# Patient Record
Sex: Female | Born: 1955 | Race: White | Hispanic: Yes | Marital: Single | Smoking: Never smoker
Health system: Southern US, Community
[De-identification: ages and names within clinical notes are randomized; demographics above are authoritative.]

## PROBLEM LIST (undated history)

## (undated) DIAGNOSIS — E119 Type 2 diabetes mellitus without complications: Secondary | ICD-10-CM

---

## 2005-11-25 ENCOUNTER — Emergency Department (HOSPITAL_COMMUNITY): Admission: EM | Admit: 2005-11-25 | Discharge: 2005-11-25 | Payer: Self-pay | Admitting: Emergency Medicine

## 2005-11-27 ENCOUNTER — Emergency Department (HOSPITAL_COMMUNITY): Admission: EM | Admit: 2005-11-27 | Discharge: 2005-11-27 | Payer: Self-pay | Admitting: Emergency Medicine

## 2007-04-17 ENCOUNTER — Emergency Department (HOSPITAL_COMMUNITY): Admission: EM | Admit: 2007-04-17 | Discharge: 2007-04-17 | Payer: Self-pay | Admitting: Emergency Medicine

## 2007-12-11 ENCOUNTER — Emergency Department (HOSPITAL_COMMUNITY): Admission: EM | Admit: 2007-12-11 | Discharge: 2007-12-11 | Payer: Self-pay | Admitting: Emergency Medicine

## 2010-05-29 ENCOUNTER — Emergency Department (HOSPITAL_COMMUNITY): Admission: EM | Admit: 2010-05-29 | Discharge: 2010-05-29 | Payer: Self-pay | Admitting: Emergency Medicine

## 2010-10-05 ENCOUNTER — Emergency Department (HOSPITAL_COMMUNITY)
Admission: EM | Admit: 2010-10-05 | Discharge: 2010-10-05 | Disposition: A | Discharge status: Home / Self Care | Payer: Self-pay | Attending: Emergency Medicine | Admitting: Emergency Medicine

## 2010-10-05 DIAGNOSIS — R631 Polydipsia: Secondary | ICD-10-CM | POA: Insufficient documentation

## 2010-10-05 DIAGNOSIS — J329 Chronic sinusitis, unspecified: Secondary | ICD-10-CM | POA: Insufficient documentation

## 2010-10-05 DIAGNOSIS — R634 Abnormal weight loss: Secondary | ICD-10-CM | POA: Insufficient documentation

## 2010-10-05 DIAGNOSIS — L298 Other pruritus: Secondary | ICD-10-CM | POA: Insufficient documentation

## 2010-10-05 DIAGNOSIS — L2989 Other pruritus: Secondary | ICD-10-CM | POA: Insufficient documentation

## 2010-10-05 DIAGNOSIS — J3489 Other specified disorders of nose and nasal sinuses: Secondary | ICD-10-CM | POA: Insufficient documentation

## 2010-10-05 DIAGNOSIS — R21 Rash and other nonspecific skin eruption: Secondary | ICD-10-CM | POA: Insufficient documentation

## 2010-10-05 DIAGNOSIS — E119 Type 2 diabetes mellitus without complications: Secondary | ICD-10-CM | POA: Insufficient documentation

## 2010-10-05 DIAGNOSIS — R5381 Other malaise: Secondary | ICD-10-CM | POA: Insufficient documentation

## 2010-10-05 DIAGNOSIS — R51 Headache: Secondary | ICD-10-CM | POA: Insufficient documentation

## 2010-10-05 LAB — GLUCOSE, CAPILLARY

## 2010-10-05 LAB — POCT I-STAT, CHEM 8
HCT: 45 % (ref 36.0–46.0)
Hemoglobin: 15.3 g/dL — ABNORMAL HIGH (ref 12.0–15.0)
Potassium: 5.1 mEq/L (ref 3.5–5.1)
Sodium: 133 mEq/L — ABNORMAL LOW (ref 135–145)

## 2011-02-07 ENCOUNTER — Other Ambulatory Visit: Payer: Self-pay | Admitting: Family Medicine

## 2011-02-08 ENCOUNTER — Other Ambulatory Visit (HOSPITAL_COMMUNITY): Payer: Self-pay | Admitting: Family Medicine

## 2011-02-08 DIAGNOSIS — Z1231 Encounter for screening mammogram for malignant neoplasm of breast: Secondary | ICD-10-CM

## 2011-02-19 ENCOUNTER — Ambulatory Visit (HOSPITAL_COMMUNITY)
Admission: RE | Admit: 2011-02-19 | Discharge: 2011-02-19 | Disposition: A | Discharge status: Home / Self Care | Payer: Self-pay | Source: Ambulatory Visit | Attending: Family Medicine | Admitting: Family Medicine

## 2011-02-19 DIAGNOSIS — Z1231 Encounter for screening mammogram for malignant neoplasm of breast: Secondary | ICD-10-CM | POA: Insufficient documentation

## 2013-06-09 ENCOUNTER — Encounter (HOSPITAL_COMMUNITY): Payer: Self-pay | Admitting: Emergency Medicine

## 2013-06-09 ENCOUNTER — Emergency Department (HOSPITAL_COMMUNITY)
Admission: EM | Admit: 2013-06-09 | Discharge: 2013-06-10 | Disposition: A | Discharge status: Home / Self Care | Payer: Self-pay | Attending: Emergency Medicine | Admitting: Emergency Medicine

## 2013-06-09 DIAGNOSIS — R197 Diarrhea, unspecified: Secondary | ICD-10-CM | POA: Insufficient documentation

## 2013-06-09 DIAGNOSIS — E119 Type 2 diabetes mellitus without complications: Secondary | ICD-10-CM | POA: Insufficient documentation

## 2013-06-09 DIAGNOSIS — R51 Headache: Secondary | ICD-10-CM | POA: Insufficient documentation

## 2013-06-09 DIAGNOSIS — J069 Acute upper respiratory infection, unspecified: Secondary | ICD-10-CM | POA: Insufficient documentation

## 2013-06-09 DIAGNOSIS — R079 Chest pain, unspecified: Secondary | ICD-10-CM | POA: Insufficient documentation

## 2013-06-09 HISTORY — DX: Type 2 diabetes mellitus without complications: E11.9

## 2013-06-09 LAB — COMPREHENSIVE METABOLIC PANEL
ALT: 39 U/L — ABNORMAL HIGH (ref 0–35)
AST: 32 U/L (ref 0–37)
Albumin: 4 g/dL (ref 3.5–5.2)
BUN: 11 mg/dL (ref 6–23)
Calcium: 9 mg/dL (ref 8.4–10.5)
Creatinine, Ser: 0.51 mg/dL (ref 0.50–1.10)
GFR calc Af Amer: >90 mL/min (ref >90)
Glucose, Bld: 347 mg/dL — ABNORMAL HIGH (ref 70–99)
Sodium: 130 mEq/L — ABNORMAL LOW (ref 135–145)
Total Protein: 7.5 g/dL (ref 6.0–8.3)

## 2013-06-09 LAB — CBC WITH DIFFERENTIAL/PLATELET
Basophils Absolute: 0 10*3/uL (ref 0.0–0.1)
Eosinophils Absolute: 0.2 10*3/uL (ref 0.0–0.7)
HCT: 40.4 % (ref 36.0–46.0)
Hemoglobin: 14.4 g/dL (ref 12.0–15.0)
Lymphocytes Relative: 40 % (ref 12–46)
Lymphs Abs: 3.7 10*3/uL (ref 0.7–4.0)
Monocytes Absolute: 0.6 10*3/uL (ref 0.1–1.0)
Monocytes Relative: 7 % (ref 3–12)
Neutro Abs: 4.7 10*3/uL (ref 1.7–7.7)
Platelets: 309 10*3/uL (ref 150–400)
RBC: 4.63 MIL/uL (ref 3.87–5.11)
WBC: 9.3 10*3/uL (ref 4.0–10.5)

## 2013-06-09 LAB — GLUCOSE, CAPILLARY: Glucose-Capillary: 258 mg/dL — ABNORMAL HIGH (ref 70–99)

## 2013-06-09 LAB — POCT I-STAT TROPONIN I

## 2013-06-09 MED ORDER — KETOROLAC TROMETHAMINE 30 MG/ML IJ SOLN
30.0000 mg | Freq: Once | INTRAMUSCULAR | Status: AC
  Administered 2013-06-09: 30 mg via INTRAVENOUS
  Filled 2013-06-09: qty 1

## 2013-06-09 MED ORDER — SODIUM CHLORIDE 0.9 % IV BOLUS (SEPSIS)
1000.0000 mL | Freq: Once | INTRAVENOUS | Status: AC
  Administered 2013-06-09: 1000 mL via INTRAVENOUS

## 2013-06-09 MED ORDER — DIPHENHYDRAMINE HCL 50 MG/ML IJ SOLN
25.0000 mg | Freq: Once | INTRAMUSCULAR | Status: AC
  Administered 2013-06-09: 25 mg via INTRAVENOUS
  Filled 2013-06-09: qty 1

## 2013-06-09 MED ORDER — METOCLOPRAMIDE HCL 5 MG/ML IJ SOLN
10.0000 mg | Freq: Once | INTRAMUSCULAR | Status: AC
  Administered 2013-06-09: 10 mg via INTRAMUSCULAR
  Filled 2013-06-09: qty 2

## 2013-06-09 NOTE — ED Notes (Signed)
Took pts CBG. CBG was 258 reported to nurse Eileen Stanford.

## 2013-06-09 NOTE — ED Notes (Addendum)
Spanish speaking, interpreter phone used. Presents with chest pain, headache, blurred vision abdominal pain,  Diarrhea, bilateral ear pain and dizziness and shaking and nausea. Pain in chest is descriebd as squeezing. Began one week ago.  Reports the ear pain and sore throat are causing the headache.  +cough

## 2013-06-09 NOTE — ED Provider Notes (Signed)
CSN: [id number]     Arrival date & time 06/09/13  1703 History   First MD Initiated Contact with Patient 06/09/13 2013     Chief Complaint  Patient presents with  . multiple complaints    (Consider location/radiation/quality/duration/timing/severity/associated sxs/prior Treatment) HPI  Margaret Austin is a 57 year old female with a past medical history of diabetes who presents the emergency department with chief point of upper respiratory symptoms, headache and diarrhea.  Patient is Spanish-speaking interpreter from its use.  Patient states that she has had for years of sinus pressure.  Over the past 15 days she's had increasing sinus pain and pressure.  Over the past 4 days she has acquired productive cough and bilateral throbbing constant headache which she rates at 10 out of 10.Denies photophobia, phonophobia, UL throbbing, N/V, visual changes, stiff neck, neck pain, rash, or "thunderclap" onset.    She is a pill and Tylenol at home moderate relief of her symptoms. The patient states that today she began having watery diarrhea but denies any abdominal pain or nausea. Patient complains of chest pain with cough. Denies DOE, SOB, chest tightness or pressure, radiation to left arm, jaw or back, nausea, or diaphoresis.   Past Medical History  Diagnosis Date  . Diabetes mellitus without complication    History reviewed. No pertinent past surgical history. History reviewed. No pertinent family history. History  Substance Use Topics  . Smoking status: Never Smoker   . Smokeless tobacco: Not on file  . Alcohol Use: No   OB History   Grav Para Term Preterm Abortions TAB SAB Ect Mult Living                 Review of Systems Ten systems reviewed and are negative for acute change, except as noted in the HPI.   Allergies  Review of patient's allergies indicates no known allergies.  Home Medications  No current outpatient prescriptions on file. BP 109/74  Pulse 69  Temp(Src) 98.8 F  (37.1 C) (Oral)  Resp 11  SpO2 100% Physical Exam Appears moderately ill but not toxic; temperature as noted in vitals. Ears normal. Eyes:glassy appearance, no discharge  Heart: RRR, NO M/G/R Throat and pharynx normal.  Hoarse of voice Neck supple. No adenopathyhy in the neck.  Sinuses non tender.  The chest is clear. Abdomen is soft and nontender.   ED Course  Procedures (including critical care time) Labs Review Labs Reviewed  COMPREHENSIVE METABOLIC PANEL - Abnormal; Notable for the following:    Sodium 130 (*)    Chloride 94 (*)    Glucose, Bld 347 (*)    ALT 39 (*)    Alkaline Phosphatase 169 (*)    All other components within normal limits  CBC WITH DIFFERENTIAL  LIPASE, BLOOD  POCT I-STAT TROPONIN I   Imaging Review No results found.  EKG Interpretation   None     ECG interpretation   Date: 06/09/2013  Rate: 71  Rhythm: normal sinus rhythm  QRS Axis: normal  Intervals: normal  ST/T Wave abnormalities: normal  Conduction Disutrbances: none  Narrative Interpretation:   Old EKG Reviewed:      MDM   1. URI (upper respiratory infection)   2. Headache   3. Diarrhea    Patient with viral like symptoms.  Visual be treated for her headache with migraine cocktail.  We'll monitor her blood sugars is initial sugars are at 347.  She has a slight elevation in her alkaline phosphatase and ALT. Gap of 10.  11:44 PM BP 110/57  Pulse 64  Temp(Src) 98.8 F (37.1 C) (Oral)  Resp 17  SpO2 97% Patient feeling much better. Requesting to leave. Will d/c with zpak The patient appears reasonably screened and/or stabilized for discharge and I doubt any other medical condition or other [facility] requiring further screening, evaluation, or treatment in the ED at this time prior to discharge.   Patient w>15 days sinus sys. Will d.c with zpack. I suspect viral infection. Pt HA treated and improved while in ED. HA non concerning for [facility], ICH, Meningitis, or temporal  arteritis. Pt is afebrile with no focal neuro deficits, nuchal rigidity, or change in vision. Pt is to follow up with PCP to discuss prophylactic medication. Pt verbalizes understanding and is agreeable with plan to dc.    Arthor Captain, PA-C 06/10/13 0008

## 2013-06-09 NOTE — ED Notes (Addendum)
Pt Spanish speaking only- interpreter phones used for RN interview.  Pt presents for evaluation of epigastric pain for the past 3 days- admits to diarrhea for the past 2 days.  Pt also complaining of a headache, blurred vision, and dizziness for the past 8 days- headache associated with symptoms.  Pt admits to being diabetic.  Pt alert and oriented X 4.

## 2013-06-10 MED ORDER — AZITHROMYCIN 250 MG PO TABS: 250.0000 mg | ORAL_TABLET | Freq: Every day | ORAL | Status: DC

## 2013-06-10 MED ORDER — LOPERAMIDE HCL 2 MG PO CAPS
2.0000 mg | ORAL_CAPSULE | Freq: Four times a day (QID) | ORAL | Status: DC | PRN
Start: 2013-06-10 — End: 2014-04-17

## 2013-06-10 NOTE — ED Provider Notes (Signed)
Medical screening examination/treatment/procedure(s) were performed by non-physician practitioner and as supervising physician I was immediately available for consultation/collaboration.   Carmita Boom, MD 06/10/13 1515 

## 2013-07-18 ENCOUNTER — Emergency Department (HOSPITAL_COMMUNITY)
Admission: EM | Admit: 2013-07-18 | Discharge: 2013-07-19 | Disposition: A | Discharge status: Home / Self Care | Payer: Self-pay | Attending: Emergency Medicine | Admitting: Emergency Medicine

## 2013-07-18 ENCOUNTER — Encounter (HOSPITAL_COMMUNITY): Payer: Self-pay | Admitting: Emergency Medicine

## 2013-07-18 DIAGNOSIS — R739 Hyperglycemia, unspecified: Secondary | ICD-10-CM

## 2013-07-18 DIAGNOSIS — H9209 Otalgia, unspecified ear: Secondary | ICD-10-CM | POA: Insufficient documentation

## 2013-07-18 DIAGNOSIS — Z79899 Other long term (current) drug therapy: Secondary | ICD-10-CM | POA: Insufficient documentation

## 2013-07-18 DIAGNOSIS — E119 Type 2 diabetes mellitus without complications: Secondary | ICD-10-CM | POA: Insufficient documentation

## 2013-07-18 LAB — URINALYSIS, ROUTINE W REFLEX MICROSCOPIC
Bilirubin Urine: NEGATIVE
Glucose, UA: >1000 mg/dL — AB
Hgb urine dipstick: NEGATIVE
Ketones, ur: NEGATIVE mg/dL
Leukocytes, UA: NEGATIVE
Nitrite: NEGATIVE
Specific Gravity, Urine: 1.036 — ABNORMAL HIGH (ref 1.005–1.030)

## 2013-07-18 LAB — URINE MICROSCOPIC-ADD ON

## 2013-07-18 MED ORDER — SODIUM CHLORIDE 0.9 % IV BOLUS (SEPSIS)
1000.0000 mL | INTRAVENOUS | Status: AC
  Administered 2013-07-18: 1000 mL via INTRAVENOUS

## 2013-07-18 MED ORDER — MORPHINE SULFATE 4 MG/ML IJ SOLN
4.0000 mg | INTRAMUSCULAR | Status: AC
  Administered 2013-07-18: 4 mg via INTRAVENOUS
  Filled 2013-07-18: qty 1

## 2013-07-18 NOTE — ED Notes (Signed)
Dr. Romeo Apple at the bedside with interpreter phone.

## 2013-07-18 NOTE — ED Notes (Signed)
Lab called Nurse First. Need to recollect urine spilled in bag.

## 2013-07-18 NOTE — ED Notes (Signed)
CBG check of 471 completed per MD.

## 2013-07-18 NOTE — ED Provider Notes (Signed)
CSN: [id number]     Arrival date & time 07/18/13  2003 History   First MD Initiated Contact with Patient 07/18/13 2313     Chief Complaint  Patient presents with  . Flank Pain  . Otalgia   (Consider location/radiation/quality/duration/timing/severity/associated sxs/prior Treatment) Patient is a 57 y.o. female presenting with flank pain and ear pain.  Flank Pain This is a new problem. Episode onset: 8 days ago. The problem occurs constantly. The problem has not changed since onset.Pertinent negatives include no chest pain, no abdominal pain, no headaches and no shortness of breath. Nothing aggravates the symptoms. Nothing relieves the symptoms. She has tried nothing for the symptoms. The treatment provided no relief.  Otalgia Location:  Left Behind ear:  No abnormality Quality:  Aching Severity:  Mild Onset quality:  Gradual Duration:  1 month Timing:  Intermittent Progression:  Unchanged Chronicity:  New Relieved by:  Nothing Worsened by:  Nothing tried Ineffective treatments: otc ear gtt's. Associated symptoms: no abdominal pain, no congestion, no cough, no diarrhea, no ear discharge, no fever, no headaches, no neck pain and no vomiting     Past Medical History  Diagnosis Date  . Diabetes mellitus without complication    History reviewed. No pertinent past surgical history. No family history on file. History  Substance Use Topics  . Smoking status: Never Smoker   . Smokeless tobacco: Not on file  . Alcohol Use: No   OB History   Grav Para Term Preterm Abortions TAB SAB Ect Mult Living                 Review of Systems  Constitutional: Negative for fever and fatigue.  HENT: Positive for ear pain. Negative for congestion, drooling and ear discharge.   Eyes: Negative for pain.  Respiratory: Negative for cough and shortness of breath.   Cardiovascular: Negative for chest pain.  Gastrointestinal: Negative for nausea, vomiting, abdominal pain and diarrhea.   Genitourinary: Positive for flank pain. Negative for dysuria and hematuria.  Musculoskeletal: Negative for back pain, gait problem and neck pain.  Skin: Negative for color change.  Neurological: Negative for dizziness and headaches.  Hematological: Negative for adenopathy.  Psychiatric/Behavioral: Negative for behavioral problems.  All other systems reviewed and are negative.    Allergies  Review of patient's allergies indicates no known allergies.  Home Medications   Current Outpatient Rx  Name  Route  Sig  Dispense  Refill  . metFORMIN (GLUCOPHAGE) 500 MG tablet   Oral   Take 500 mg by mouth 2 (two) times daily with a meal. Pt stats that she is out of metformin has not had it for 3 months          BP 132/74  Pulse 84  Temp(Src) 97.8 F (36.6 C) (Oral)  Resp 20  Ht 5\' 1"  (1.549 m)  Wt 166 lb (75.297 kg)  BMI 31.38 kg/m2  SpO2 97% Physical Exam  Nursing note and vitals reviewed. Constitutional: She is oriented to person, place, and time. She appears well-developed and well-nourished.  HENT:  Head: Normocephalic.  Mouth/Throat: Oropharynx is clear and moist. No oropharyngeal exudate.  Tympanic membranes appear normal bilaterally.  Eyes: Conjunctivae and EOM are normal. Pupils are equal, round, and reactive to light.  Neck: Normal range of motion. Neck supple.  Cardiovascular: Normal rate, regular rhythm, normal heart sounds and intact distal pulses.  Exam reveals no gallop and no friction rub.   No murmur heard. Pulmonary/Chest: Effort normal and breath sounds normal.  No respiratory distress. She has no wheezes.  Abdominal: Soft. Bowel sounds are normal. There is no tenderness. There is no rebound and no guarding.  Musculoskeletal: Normal range of motion. She exhibits no edema and no tenderness.  Mild left CVA tenderness to palpation.  Neurological: She is alert and oriented to person, place, and time.  Skin: Skin is warm and dry.  Psychiatric: She has a normal mood  and affect. Her behavior is normal.    ED Course  Procedures (including critical care time) Labs Review Labs Reviewed  URINALYSIS, ROUTINE W REFLEX MICROSCOPIC - Abnormal; Notable for the following:    Specific Gravity, Urine 1.036 (*)    Glucose, UA >1000 (*)    All other components within normal limits  URINE MICROSCOPIC-ADD ON - Abnormal; Notable for the following:    Squamous Epithelial / LPF FEW (*)    All other components within normal limits  GLUCOSE, CAPILLARY - Abnormal; Notable for the following:    Glucose-Capillary 471 (*)    All other components within normal limits  CBC WITH DIFFERENTIAL - Abnormal; Notable for the following:    MCHC 36.1 (*)    Lymphocytes Relative 47 (*)    Lymphs Abs 4.2 (*)    All other components within normal limits  COMPREHENSIVE METABOLIC PANEL - Abnormal; Notable for the following:    Sodium 128 (*)    Chloride 92 (*)    Glucose, Bld 488 (*)    Creatinine, Ser 0.41 (*)    Alkaline Phosphatase 160 (*)    All other components within normal limits  URINALYSIS W MICROSCOPIC + REFLEX CULTURE - Abnormal; Notable for the following:    Specific Gravity, Urine 1.040 (*)    Glucose, UA >1000 (*)    All other components within normal limits  GLUCOSE, CAPILLARY - Abnormal; Notable for the following:    Glucose-Capillary 451 (*)    All other components within normal limits  GLUCOSE, CAPILLARY - Abnormal; Notable for the following:    Glucose-Capillary 298 (*)    All other components within normal limits  GLUCOSE, CAPILLARY - Abnormal; Notable for the following:    Glucose-Capillary 293 (*)    All other components within normal limits  GLUCOSE, CAPILLARY - Abnormal; Notable for the following:    Glucose-Capillary 247 (*)    All other components within normal limits  LIPASE, BLOOD   Imaging Review Ct Abdomen Pelvis Wo Contrast  07/19/2013   CLINICAL DATA:  58 year old female with bilateral flank pain. History of urinary calculi.  EXAM: CT  ABDOMEN AND PELVIS WITHOUT CONTRAST  TECHNIQUE: Multidetector CT imaging of the abdomen and pelvis was performed following the standard protocol without intravenous contrast.  COMPARISON:  None.  FINDINGS: A 1 cm hypodense lesion within the left liver (image 24) is nonspecific but statistically benign. No other hepatic abnormalities are identified.  The spleen, pancreas, adrenal glands and kidneys are unremarkable. There is no evidence of urinary calculi or hydronephrosis.  Cholelithiasis identified without CT evidence of acute cholecystitis.  Please note that parenchymal abnormalities may be missed without intravenous contrast.  There is no evidence of free fluid, enlarged lymph nodes, biliary dilation or abdominal aortic aneurysm.  The bowel, bladder and appendix are unremarkable. There is no evidence of bowel obstruction, abscess or pneumoperitoneum.  The uterus and adnexal regions are within normal limits except for evidence of tubal ligation.  No acute or suspicious bony abnormalities are identified.  IMPRESSION: No evidence of acute abnormality.  No evidence  of urinary calculi.  Cholelithiasis without CT evidence of acute cholecystitis.  Nonspecific 1 cm left hepatic lesion - statistically benign unless the patient has a history of primary neoplasm. Comparison with any outside studies recommended.   Electronically Signed   By: Laveda Abbe M.D.   On: 07/19/2013 00:38    EKG Interpretation   None       MDM   1. Hyperglycemia    11:34 PM 57 y.o. female who presents with left flank pain for the last 8 days. The patient denies any fever, vomiting, diarrhea, or dysuria. She does note a history of kidney stones. She states that she was diagnosed with diabetes one year ago and has been on metformin. She states that she felt like she was getting better and quit taking the metformin approximately 2 months ago. She notes that recently her blood sugars have read "high" on her home glucometer. The patient is  afebrile and vital signs are unremarkable here. Will get pain control with morphine, CT scan to evaluate for kidney stone, and IV fluid for hyperglycemia. Will get screening labs as well.  7:33 AM: No anion gap or evidence of acidosis. BS came down w/ small amount of insulin and IVF. CT showed cholelithiasis and hepatic lesion. I discussed the lesion w/ her using an interpreter. She denies a hx of cancer. Will have her notify her pcp of the lesion so they can follow it. Will have pt resume taking metformin and [facility] to re-establish with a physician.  She only got 1 dose of morphine in the ED and her pain has been gone since then. I am unsure of the etiology of the left flank pain or her otalgia.  I have discussed the diagnosis/risks/treatment options with the patient and believe the pt to be eligible for discharge home to follow-up with and establish with a pcp. We also discussed returning to the ED immediately if new or worsening sx occur. We discussed the sx which are most concerning (e.g., elevated BS's, fever, return of abd pain) that necessitate immediate return. Any new prescriptions provided to the patient are listed below.  Discharge Medication List as of 07/19/2013  7:37 AM    START taking these medications   Details  !! metFORMIN (GLUCOPHAGE) 500 MG tablet Take 1 tablet (500 mg total) by mouth 2 (two) times daily with a meal., Starting 07/19/2013, Until Discontinued, Print     !! - Potential duplicate medications found. Please discuss with provider.       Junius Argyle, MD 07/20/13 [phone]

## 2013-07-18 NOTE — ED Notes (Signed)
Pt. reports left flank pain and bilateral ear aches for several days , denies injury , no dysuria or hematuria .

## 2013-07-19 ENCOUNTER — Emergency Department (HOSPITAL_COMMUNITY): Payer: Self-pay

## 2013-07-19 LAB — URINALYSIS W MICROSCOPIC + REFLEX CULTURE
Leukocytes, UA: NEGATIVE
Nitrite: NEGATIVE
Specific Gravity, Urine: 1.04 — ABNORMAL HIGH (ref 1.005–1.030)
pH: 6.5 (ref 5.0–8.0)

## 2013-07-19 LAB — CBC WITH DIFFERENTIAL/PLATELET
Basophils Relative: 0 % (ref 0–1)
Eosinophils Absolute: 0.2 10*3/uL (ref 0.0–0.7)
HCT: 39.3 % (ref 36.0–46.0)
Hemoglobin: 14.2 g/dL (ref 12.0–15.0)
MCH: 30.7 pg (ref 26.0–34.0)
MCHC: 36.1 g/dL — ABNORMAL HIGH (ref 30.0–36.0)
Monocytes Absolute: 0.5 10*3/uL (ref 0.1–1.0)
Monocytes Relative: 6 % (ref 3–12)
Neutrophils Relative %: 44 % (ref 43–77)
RDW: 11.9 % (ref 11.5–15.5)

## 2013-07-19 LAB — COMPREHENSIVE METABOLIC PANEL
Albumin: 3.9 g/dL (ref 3.5–5.2)
BUN: 10 mg/dL (ref 6–23)
Creatinine, Ser: 0.41 mg/dL — ABNORMAL LOW (ref 0.50–1.10)
Total Protein: 6.9 g/dL (ref 6.0–8.3)

## 2013-07-19 LAB — GLUCOSE, CAPILLARY: Glucose-Capillary: 247 mg/dL — ABNORMAL HIGH (ref 70–99)

## 2013-07-19 LAB — LIPASE, BLOOD: Lipase: 27 U/L (ref 11–59)

## 2013-07-19 MED ORDER — SODIUM CHLORIDE 0.9 % IV BOLUS (SEPSIS)
1000.0000 mL | INTRAVENOUS | Status: AC
  Administered 2013-07-19: 1000 mL via INTRAVENOUS

## 2013-07-19 MED ORDER — INSULIN ASPART 100 UNIT/ML ~~LOC~~ SOLN
5.0000 [IU] | Freq: Once | SUBCUTANEOUS | Status: AC
  Administered 2013-07-19: 5 [IU] via INTRAVENOUS
  Filled 2013-07-19: qty 1

## 2013-07-19 MED ORDER — METFORMIN HCL 500 MG PO TABS: 500.0000 mg | ORAL_TABLET | Freq: Two times a day (BID) | ORAL | Status: DC

## 2013-07-19 MED ORDER — DOCUSATE SODIUM 100 MG PO CAPS: 100.0000 mg | ORAL_CAPSULE | Freq: Every day | ORAL | Status: DC

## 2013-07-19 MED ORDER — INSULIN ASPART 100 UNIT/ML ~~LOC~~ SOLN
3.0000 [IU] | Freq: Once | SUBCUTANEOUS | Status: AC
  Administered 2013-07-19: 3 [IU] via INTRAVENOUS
  Filled 2013-07-19: qty 1

## 2013-07-19 NOTE — ED Notes (Signed)
CBG check of 298 completed

## 2013-07-19 NOTE — ED Notes (Signed)
CBG check of 451

## 2013-07-19 NOTE — ED Notes (Signed)
CBG check of 293.

## 2013-07-30 ENCOUNTER — Ambulatory Visit: Payer: Self-pay | Attending: Internal Medicine

## 2013-07-30 VITALS — BP 99/62 | HR 83 | Temp 98.1°F | Resp 16 | Ht 60.0 in | Wt 163.0 lb

## 2013-07-30 DIAGNOSIS — B373 Candidiasis of vulva and vagina: Secondary | ICD-10-CM

## 2013-07-30 DIAGNOSIS — E1165 Type 2 diabetes mellitus with hyperglycemia: Secondary | ICD-10-CM

## 2013-07-30 DIAGNOSIS — E119 Type 2 diabetes mellitus without complications: Secondary | ICD-10-CM

## 2013-07-30 DIAGNOSIS — H60391 Other infective otitis externa, right ear: Secondary | ICD-10-CM

## 2013-07-30 LAB — POCT URINALYSIS DIPSTICK
Blood, UA: NEGATIVE
Glucose, UA: 500
Nitrite, UA: NEGATIVE
Urobilinogen, UA: 0.2

## 2013-07-30 LAB — GLUCOSE, POCT (MANUAL RESULT ENTRY): POC Glucose: 369 mg/dl — AB (ref 70–99)

## 2013-07-30 MED ORDER — NEOMYCIN-POLYMYXIN-HC 3.5-10000-1 OT SOLN: 4.0000 [drp] | Freq: Four times a day (QID) | OTIC | Status: DC

## 2013-07-30 MED ORDER — METFORMIN HCL 1000 MG PO TABS: 1000.0000 mg | ORAL_TABLET | Freq: Two times a day (BID) | ORAL | Status: DC

## 2013-07-30 MED ORDER — GLIPIZIDE 5 MG PO TABS: 5.0000 mg | ORAL_TABLET | Freq: Two times a day (BID) | ORAL | Status: DC

## 2013-07-30 MED ORDER — FLUCONAZOLE 150 MG PO TABS: 150.0000 mg | ORAL_TABLET | Freq: Once | ORAL | Status: DC

## 2013-07-30 NOTE — Progress Notes (Unsigned)
Pt here to establish care for Hx newly diagnosed diabetes x 2 weeks ago. Pt unsure of Metformin dosage; takes BID C/o bright yellow urine with strong odor,itch and burn Lower pelvic pain with radiating back pain [place] interpretor used for spanish language

## 2013-07-31 NOTE — Progress Notes (Unsigned)
   Subjective:    Patient ID: Margaret Austin, female    DOB: [birth year], 57 y.o.   MRN: [id number]  HPI Pt here for f/u on dm which she feels is controlled but a1c of 14 suggests otherwise. She gets repeated vaginal yeast infections. She has a meter but doesn't check sugars. She has polyurea and polydipsia. Sometimes takes her meds. No blurry vision or confusion. Right ear painful.  Review of Systems A 12 point review of systems is negative except as per hpi.       Objective:   Physical Exam Nursing note and vitals reviewed. Constitutional: She is oriented to person, place, and time. She appears well-developed and well-nourished.  HENT:  Right Ear: External ear normal.  Left Ear: External ear normal.  Nose: Nose normal.  Mouth/Throat: Oropharynx is clear and moist. No oropharyngeal exudate.  Eyes: Conjunctivae are normal. Pupils are equal, round, and reactive to light.  Neck: Normal range of motion. Neck supple. No thyromegaly present.  Cardiovascular: Normal rate, regular rhythm and normal heart sounds.   Pulmonary/Chest: Effort normal and breath sounds normal.  Abdominal: Soft. Bowel sounds are normal. She exhibits no distension. There is no tenderness. There is no rebound.  Lymphadenopathy:    She has no cervical adenopathy.  Neurological: She is alert and oriented to person, place, and time. She has normal reflexes.  Skin: Skin is warm and dry.  Psychiatric: She has a normal mood and affect. Her behavior is normal.         Assessment & Plan:  Rutherford Nail was seen today for establish care, diabetes, headache, urinary tract infection and back pain.  Diagnoses and associated orders for this visit:  Diabetes type 2, uncontrolled - glipiZIDE (GLUCOTROL) 5 MG tablet; Take 1 tablet (5 mg total) by mouth 2 (two) times daily before a meal. - metFORMIN (GLUCOPHAGE) 1000 MG tablet; Take 1 tablet (1,000 mg total) by mouth 2 (two) times daily with a meal.  Diabetes - Urinalysis  Dipstick - HgB A1c - Glucose (CBG)  Vaginal candida - fluconazole (DIFLUCAN) 150 MG tablet; Take 1 tablet (150 mg total) by mouth once.  Otitis, externa, infective, right - neomycin-polymyxin-hydrocortisone (CORTISPORIN) otic solution; Place 4 drops into the right ear 4 (four) times daily.  Extensive counselling about DM given. She declines dm ed at this time.

## 2013-08-31 ENCOUNTER — Ambulatory Visit: Payer: Self-pay

## 2013-09-15 ENCOUNTER — Ambulatory Visit: Payer: No Typology Code available for payment source | Attending: Internal Medicine | Admitting: Internal Medicine

## 2013-09-15 ENCOUNTER — Encounter: Payer: Self-pay | Admitting: Internal Medicine

## 2013-09-15 VITALS — BP 110/73 | HR 67 | Temp 98.7°F | Resp 16 | Ht 60.0 in | Wt 166.0 lb

## 2013-09-15 DIAGNOSIS — J329 Chronic sinusitis, unspecified: Secondary | ICD-10-CM | POA: Insufficient documentation

## 2013-09-15 DIAGNOSIS — K219 Gastro-esophageal reflux disease without esophagitis: Secondary | ICD-10-CM

## 2013-09-15 DIAGNOSIS — D229 Melanocytic nevi, unspecified: Secondary | ICD-10-CM

## 2013-09-15 DIAGNOSIS — D239 Other benign neoplasm of skin, unspecified: Secondary | ICD-10-CM

## 2013-09-15 DIAGNOSIS — R0981 Nasal congestion: Secondary | ICD-10-CM

## 2013-09-15 DIAGNOSIS — Z139 Encounter for screening, unspecified: Secondary | ICD-10-CM

## 2013-09-15 DIAGNOSIS — R21 Rash and other nonspecific skin eruption: Secondary | ICD-10-CM | POA: Insufficient documentation

## 2013-09-15 DIAGNOSIS — J3489 Other specified disorders of nose and nasal sinuses: Secondary | ICD-10-CM

## 2013-09-15 DIAGNOSIS — E119 Type 2 diabetes mellitus without complications: Secondary | ICD-10-CM | POA: Insufficient documentation

## 2013-09-15 LAB — GLUCOSE, POCT (MANUAL RESULT ENTRY): POC GLUCOSE: 94 mg/dL (ref 70–99)

## 2013-09-15 LAB — POCT GLYCOSYLATED HEMOGLOBIN (HGB A1C): HEMOGLOBIN A1C: 9.4

## 2013-09-15 MED ORDER — OMEPRAZOLE 20 MG PO CPDR: 20.0000 mg | DELAYED_RELEASE_CAPSULE | Freq: Every day | ORAL | Status: DC

## 2013-09-15 MED ORDER — HYDROCORTISONE 1 % EX CREA: 1.0000 "application " | TOPICAL_CREAM | Freq: Two times a day (BID) | CUTANEOUS | Status: DC

## 2013-09-15 MED ORDER — SALINE SPRAY 0.65 % NA SOLN: 1.0000 | NASAL | Status: DC | PRN

## 2013-09-15 MED ORDER — METFORMIN HCL 1000 MG PO TABS: 1000.0000 mg | ORAL_TABLET | Freq: Two times a day (BID) | ORAL | Status: DC

## 2013-09-15 MED ORDER — SULFAMETHOXAZOLE-TMP DS 800-160 MG PO TABS: 1.0000 | ORAL_TABLET | Freq: Two times a day (BID) | ORAL | Status: DC

## 2013-09-15 NOTE — Progress Notes (Signed)
Pt is here to establish care. Pt has a sinus infection and her nose started bleeding today. Pt needed the language line today. Pt states that her neck has been very itchy lately.

## 2013-09-15 NOTE — Progress Notes (Signed)
Patient Demographics  Margaret Austin, is a 58 y.o. female  IHK:[id number]  VFI:[id number]  DOB - [birth year]  CC:  Chief Complaint  Patient presents with  . Establish Care       HPI: Margaret Austin is a 58 y.o. female here today to establish medical care. She has history of diabetes and is taking metformin, she is requesting refill on the medication, she has been having lot of stuff nose runny nose some nasal bleeding today headache productive cough her, denies any chest pain or shortness of breath, denies smoking cigarettes, she also reported to have moles on the back ? increasing in the size, as per patient she saw a dermatologist several years ago, she is also having lot of reflux heartburn, denies any change in bowel habits. Patient also reported to have itchy rash on her neck, denies any change in cosmetics or applying new cream recently.  Patient has No headache, No chest pain, No abdominal pain - No Nausea, No new weakness tingling or numbness, No Cough - SOB.  No Known Allergies Past Medical History  Diagnosis Date  . Diabetes mellitus without complication    Current Outpatient Prescriptions on File Prior to Visit  Medication Sig Dispense Refill  . docusate sodium (COLACE) 100 MG capsule Take 1 capsule (100 mg total) by mouth daily.  20 capsule  0   No current facility-administered medications on file prior to visit.   History reviewed. No pertinent family history. History   Social History  . Marital Status: Single    Spouse Name: N/A    Number of Children: N/A  . Years of Education: N/A   Occupational History  . Not on file.   Social History Main Topics  . Smoking status: Never Smoker   . Smokeless tobacco: Not on file  . Alcohol Use: No  . Drug Use: No  . Sexual Activity: Not on file   Other Topics Concern  . Not on file   Social History Narrative  . No narrative on file    Review of Systems: Constitutional: Negative for fever, chills, diaphoresis, activity  change, appetite change and fatigue. HENT: Negative for ear pain, nosebleeds, congestion, facial swelling, rhinorrhea, neck pain, neck stiffness and ear discharge.  Eyes: Negative for pain, discharge, redness, itching and visual disturbance. Respiratory: Negative for cough, choking, chest tightness, shortness of breath, wheezing and stridor.  Cardiovascular: Negative for chest pain, palpitations and leg swelling. Gastrointestinal: Negative for abdominal distention.+ Heartburn  Genitourinary: Negative for dysuria, urgency, frequency, hematuria, flank pain, decreased urine volume, difficulty urinating and dyspareunia.  Musculoskeletal: Negative for back pain, joint swelling, arthralgia and gait problem. Neurological: Negative for dizziness, tremors, seizures, syncope, facial asymmetry, speech difficulty, weakness, light-headedness, numbness and headaches.  Psychiatric/Behavioral: Negative for hallucinations, behavioral problems, confusion, dysphoric mood, decreased concentration and agitation.    Objective:   Filed Vitals:   09/15/13 1446  BP: 110/73  Pulse: 67  Temp: 98.7 F (37.1 C)  Resp: 16    Physical Exam: Constitutional: Patient appears well-developed and well-nourished. No distress. HENT:  nasal congestion and sinus tenderness+  Eyes: Conjunctivae and EOM are normal. PERRLA, no scleral icterus. Neck: Normal ROM. Neck supple. No JVD. No tracheal deviation. No thyromegaly. CVS: RRR, S1/S2 +, no murmurs, no gallops, no carotid bruit.  Pulmonary: Effort and breath sounds normal, no stridor, rhonchi, wheezes, rales.  Abdominal: Soft. BS +, no distension, tenderness, rebound or guarding.  Musculoskeletal: Normal range of motion. No edema and no tenderness.  Neuro: Alert.  Normal reflexes, muscle tone coordination. No cranial nerve deficit. Skin:  erythematous rash on the neck ... moles on the back Psychiatric: Normal mood and affect. Behavior, judgment, thought content normal.  Lab  Results  Component Value Date   WBC 9.0 07/18/2013   HGB 14.2 07/18/2013   HCT 39.3 07/18/2013   MCV 85.1 07/18/2013   PLT 244 07/18/2013   Lab Results  Component Value Date   CREATININE 0.41* 07/18/2013   BUN 10 07/18/2013   NA 128* 07/18/2013   K 3.9 07/18/2013   CL 92* 07/18/2013   CO2 27 07/18/2013    Lab Results  Component Value Date   HGBA1C 9.4 09/15/2013   Lipid Panel  No results found for this basename: chol, trig, hdl, cholhdl, vldl, ldlcalc       Assessment and plan:   1. Diabetes  - Glucose (CBG) - HgB A1c - metFORMIN (GLUCOPHAGE) 1000 MG tablet; Take 1 tablet (1,000 mg total) by mouth 2 (two) times daily with a meal.  Dispense: 60 tablet; Refill: 3  also advise patient for diabetes meal planning and was given the handout.   Sinusitis/ nasal congestion    prescribed her Bactrim for tenderness  and nasal spray For nasal congestion   2. Numerous moles  - Ambulatory referral to Dermatology  3. Rash  - hydrocortisone cream 1 %; Apply 1 application topically 2 (two) times daily.  Dispense: 30 g; Refill: 1  4. GERD (gastroesophageal reflux disease)  - omeprazole (PRILOSEC) 20 MG capsule; Take 1 capsule (20 mg total) by mouth daily.  Dispense: 30 capsule; Refill: 3  5. Screening  - CBC with Differential - COMPLETE METABOLIC PANEL WITH GFR - TSH - Lipid panel - Vit D  25 hydroxy (rtn osteoporosis monitoring)   Return in about 6 weeks (around 10/27/2013).     Lorayne Marek, MD

## 2013-09-15 NOTE — Patient Instructions (Signed)
Gua de planeamiento de la alimentacin para diabticos (Diabetes Meal Planning Guide) La gua de planeamiento de alimentacin para diabticos es una herramienta para ayudarlo a planear sus comidas y colaciones. Es importante para las personas con diabetes controlar sus niveles de Location manager. Elegir los [employer] correctos y las cantidades adecuadas durante el da le ayudar a Media planner. [place] bien puede incluso ayudarlo a mejorar la presin sangunea y Science writer o Theatre manager un peso saludable. CUENTE LOS HIDRATOS DE CARBONO CON FACILIDAD Cuando consume hidratos de carbono, stos se transforman en azcar (glucosa). Esto a su vez Agricultural consultant de Museum/gallery exhibitions officer. El conteo de carbohidratos puede ayudarlo a Chief Technology Officer este nivel para que se sienta mejor. Al planear sus alimentos con el conteo de carbohidratos, podr tener ms flexibilidad en lo que come y Curator con el consumo de alimentos. El conteo de carbohidratos significa simplemente sumar la cantidad total de gramos de carbohidratos a sus comidas o colaciones. Trate de consumir la misma cantidad en cada comida. A continuacin encontrar una lista de 1 porcin o 15 gr. de carbohidratos. A continuacin se enumeran. Pregunte al mdico cuntos gramos de carbohidratos necesita comer en cada comida o colacin. Almidones y granos  1 [place] de pan.   bollo ingls o bollo para hamburguesa o hotdog.   taza de cereal fro (sin azcar).   taza de pasta o arroz cocido.   taza de vegetales que contengan almidn (maz, papas, arvejas, porotos, calabaza).  1 omelette (6 pulgadas).   bollo.  1 waffle o panqueque (del tamao de un CD).   taza de cereales cocidos.  4 a 6 galletas saldas pequeas. *Se recomienda el consumo de granos enteros. Frutas  1 taza de frutos rojos, meln, papaya o anan sin azcar.  1 fruta fresca pequea.   banana o mango.   taza de jugo de frutas (4 onzas sin endulzar).    taza de fruta envasada en jugo natural o agua.  2 cucharadas de frutas secas.  12-15 uvas o cerezas. Leche y yogurt  1 taza de [employer] o al 1%.  [place].  6 onzas de yogurt descremado con edulcorante sin azcar.  6 onzas de yogur descremado de soja.  6 onzas de yogur natural. Vegetales  1 taza de vegetales crudos o  de vegetales cocidos se considera cero carbohidratos o una comida "libre".  Si come 3 o ms porciones en una comida, cuntelas como 1 porcin de carbohidratos. Otros carbohidratos   onzas de chips o pretzels.   taza de helado de crema o yogur helado.   taza de helado de agua.  5 cm de torta no congelada.  1 cucharada de miel, azcar, mermelada, jalea o almbar.  2 galletitas dulces pequeas.  3 cuadrados de crackers de graham.  3 tazas de palomitas de maz.  6 crackers.  1 taza de caldo.  Cuente 1 taza de guisado u otra mezcla de alimentos como 2 porciones de carbohidratos.  Los alimentos con menos de 20 caloras por porcin deben contarse como cero carbohidratos o alimento "libre". Si lo desea compre un libro o software de computacin que enumere la cantidad de gramos de carbohidratos de los diferentes alimentos. Adems, el panel nutricional en las etiquetas de los productos que consume es una buena fuente de informacin. Le indicar el tamao de la porcin y la cantidad total de carbohidratos que consumir por cada una. Divida este nmero por 15 para obtener el nmero  de conteo de carbohidratos por porcin. Recuerde: cada porcin son 15 gramos de carbohidratos. PORCIONES La medicin de los alimentos y el tamao de las porciones lo ayudarn a controlar la cantidad exacta de comida que debe ingerir. La lista que sigue le mostrar el tamao de algunas porciones comunes.   1 onza.................4 dados apilados.  3 onzas...............Un mazo de cartas.  1 cucharadita.....La punta de un dedo pequeo.  1  cucharada........Un dedo.  2 cucharadas......Una pelota de golf.   taza...............La mitad de un puo.  1 taza................Un puo. EJEMPLO DE PLAN DE ALIMENTACIN PARA DIABTICOS: A continuacin se muestra un ejemplo de plan de alimentacin que incluye comidas de los grupos de granos y fculas, vegetales, frutas y carnes. Un nutricionista podr confeccionarle un plan individualizado para cubrir sus necesidades calricas y decirle el nmero de porciones que necesita de cada grupo. Sin embargo, podra intercambiar los alimentos que contengan carbohidratos (lcteos, cereales y frutas). Controlar la cantidad total de carbohidratos en los alimentos o colaciones es ms importante que asegurarse de incluir todos los grupos alimenticios cada vez que come.  El siguiente plan de alimentacin es un ejemplo de una dieta de 2000 caloras mediante el conteo de carbohidratos. Este plan contiene 17 porciones de carbohidratos. Desayuno  1 taza de avena (2 porciones de carbohidratos).   taza de yogur light(1 porcin de carbohidratos).  1 taza de arndanos (1 porcin de carbohidratos).   taza de almendras. Colacin  1 manzana grande (2 porciones de carbohidratos).  1 palito de queso bajo en grasa. Almuerzo  Ensalada de pechuga de pollo.  1 taza de espinacas.   taza de tomates cortados.  2 oz (60 gr) de pechuga de pollo en rebanadas.  2 cucharadas de aderezo italiano bajo en contenido graso.  12 galletas integrales (2 porciones de carbohidratos).  12 a 15 uvas (1 porcin de carbohidratos).  1 taza de leche descremada (1porcin de carbohidratos). Colacin  1 taza de zanahorias.   taza de pur de garbanzos (1 porcin de carbohidratos). Cena  3 oz (80 gr) de salmn a la parrilla.  1 taza de arroz integral (3 porciones de carbohidratos). Colacin  1  taza de brcoli al vapor (1 porcin de carbohidrato) con una cucharadita de aceite de oliva y jugo de limn.  1 taza de  budn light (2 porciones de carbohidratos). HOJA DE PLANEAMIENTO DE LA ALIMENTACIN: El dietista podr utilizar esta hoja para ayudarlo a decidir cuntas porciones y qu tipos de alimentos son los adecuados para usted.  DESAYUNO Grupo de alimentos y porciones / Alimento elegido Granos/Fculas_________________________________________________ Lcteos________________________________________________________ Vegetales ______________________________________________________ Fruta __________________________________________________________ Carnes _________________________________________________________ Grasas _________________________________________________________ ALMUERZO Grupo de alimentos y porciones / Alimento elegido Granos/Fculas___________________________________________________ Lcteos_________________________________________________________ Fruta ___________________________________________________________ Carnes __________________________________________________________ Grasas __________________________________________________________ CENA Grupo de alimentos y porciones / Alimento elegido Granos/Fculas___________________________________________________ Lcteos_________________________________________________________ Fruta ___________________________________________________________ Carnes __________________________________________________________ Grasas __________________________________________________________ COLACIN Grupo de alimentos y porciones / Alimento elegido Granos/Fculas_________________________________________________ Lcteos________________________________________________________ Vegetales ______________________________________________________ Fruta _________________________________________________________ Carnes ________________________________________________________ Grasas ________________________________________________________ TOTALES  DIARIOS Fculas_______________________________________________________ Vegetales _____________________________________________________ Fruta ________________________________________________________ Lcteos_______________________________________________________ Carnes________________________________________________________ Grasas ________________________________________________________ Document Released: 11/13/2007 Document Revised: 10/29/2011 ExitCare Patient Information 2014 [employer].  

## 2013-09-16 ENCOUNTER — Telehealth: Payer: Self-pay

## 2013-09-16 LAB — CBC WITH DIFFERENTIAL/PLATELET
BASOS ABS: 0 10*3/uL (ref 0.0–0.1)
BASOS PCT: 1 % (ref 0–1)
EOS ABS: 0.1 10*3/uL (ref 0.0–0.7)
Eosinophils Relative: 1 % (ref 0–5)
HEMATOCRIT: 37.7 % (ref 36.0–46.0)
HEMOGLOBIN: 12.8 g/dL (ref 12.0–15.0)
Lymphocytes Relative: 41 % (ref 12–46)
Lymphs Abs: 3.5 10*3/uL (ref 0.7–4.0)
MCH: 29.8 pg (ref 26.0–34.0)
MCHC: 34 g/dL (ref 30.0–36.0)
MCV: 87.7 fL (ref 78.0–100.0)
MONO ABS: 0.6 10*3/uL (ref 0.1–1.0)
MONOS PCT: 7 % (ref 3–12)
NEUTROS ABS: 4.3 10*3/uL (ref 1.7–7.7)
NEUTROS PCT: 50 % (ref 43–77)
Platelets: 321 10*3/uL (ref 150–400)
RBC: 4.3 MIL/uL (ref 3.87–5.11)
RDW: 14 % (ref 11.5–15.5)
WBC: 8.6 10*3/uL (ref 4.0–10.5)

## 2013-09-16 LAB — LIPID PANEL
Cholesterol: 144 mg/dL (ref 0–200)
HDL: 31 mg/dL — ABNORMAL LOW (ref >39)
LDL Cholesterol: 81 mg/dL (ref 0–99)
Total CHOL/HDL Ratio: 4.6 Ratio
Triglycerides: 162 mg/dL — ABNORMAL HIGH (ref <150)
VLDL: 32 mg/dL (ref 0–40)

## 2013-09-16 LAB — COMPLETE METABOLIC PANEL WITH GFR
ALBUMIN: 3.9 g/dL (ref 3.5–5.2)
ALT: 28 U/L (ref 0–35)
AST: 23 U/L (ref 0–37)
Alkaline Phosphatase: 97 U/L (ref 39–117)
BUN: 9 mg/dL (ref 6–23)
CALCIUM: 8.5 mg/dL (ref 8.4–10.5)
CHLORIDE: 104 meq/L (ref 96–112)
CO2: 29 mEq/L (ref 19–32)
Creat: 0.56 mg/dL (ref 0.50–1.10)
GLUCOSE: 93 mg/dL (ref 70–99)
POTASSIUM: 3.1 meq/L — AB (ref 3.5–5.3)
Sodium: 138 mEq/L (ref 135–145)
Total Bilirubin: 0.5 mg/dL (ref 0.3–1.2)
Total Protein: 6.6 g/dL (ref 6.0–8.3)

## 2013-09-16 LAB — TSH: TSH: 2.593 u[IU]/mL (ref 0.350–4.500)

## 2013-09-16 LAB — VITAMIN D 25 HYDROXY (VIT D DEFICIENCY, FRACTURES): VIT D 25 HYDROXY: 17 ng/mL — AB (ref 30–89)

## 2013-09-16 NOTE — Telephone Encounter (Signed)
Patient not available Interpreter left message for patient to return call

## 2013-09-16 NOTE — Telephone Encounter (Signed)
Message copied by Dorothe Pea on Wed Sep 16, 2013  2:14 PM ------      Message from: Lorayne Marek      Created: Wed Sep 16, 2013  9:56 AM       Blood work reviewed, noticed low vitamin D, call patient advise to start ergocalciferol 50,000 units once a week for the duration of  12 weeks.        also Noticed low potassium level,advise patient to  take KCL 20 mEq daily for 10 days. will repeat blood chemistry on the next visit. ------

## 2013-10-27 ENCOUNTER — Ambulatory Visit: Payer: No Typology Code available for payment source | Admitting: Internal Medicine

## 2013-11-20 ENCOUNTER — Ambulatory Visit: Payer: No Typology Code available for payment source | Admitting: Internal Medicine

## 2013-12-03 ENCOUNTER — Ambulatory Visit: Payer: No Typology Code available for payment source | Admitting: Internal Medicine

## 2013-12-17 ENCOUNTER — Ambulatory Visit: Payer: No Typology Code available for payment source | Attending: Internal Medicine | Admitting: Family Medicine

## 2013-12-17 ENCOUNTER — Encounter: Payer: Self-pay | Admitting: Family Medicine

## 2013-12-17 VITALS — BP 118/80 | HR 68 | Temp 98.2°F | Resp 16 | Wt 175.8 lb

## 2013-12-17 DIAGNOSIS — H9209 Otalgia, unspecified ear: Secondary | ICD-10-CM | POA: Insufficient documentation

## 2013-12-17 DIAGNOSIS — R21 Rash and other nonspecific skin eruption: Secondary | ICD-10-CM

## 2013-12-17 DIAGNOSIS — E119 Type 2 diabetes mellitus without complications: Secondary | ICD-10-CM | POA: Insufficient documentation

## 2013-12-17 DIAGNOSIS — F17200 Nicotine dependence, unspecified, uncomplicated: Secondary | ICD-10-CM | POA: Insufficient documentation

## 2013-12-17 LAB — POCT GLYCOSYLATED HEMOGLOBIN (HGB A1C): Hemoglobin A1C: 6.2

## 2013-12-17 MED ORDER — HYDROCORTISONE 1 % EX CREA: TOPICAL_CREAM | CUTANEOUS | Status: DC

## 2013-12-17 MED ORDER — OMEPRAZOLE 40 MG PO CPDR: 40.0000 mg | DELAYED_RELEASE_CAPSULE | Freq: Every day | ORAL | Status: DC

## 2013-12-17 MED ORDER — METFORMIN HCL 1000 MG PO TABS: 1000.0000 mg | ORAL_TABLET | Freq: Two times a day (BID) | ORAL | Status: DC

## 2013-12-17 NOTE — Patient Instructions (Addendum)
Do not use peroxide in your ear Take medication as prescribe Return in three months If the ear is not better in three weeks -call and schedule a follow up appointment      Reflujo gastroesofgico - Adultos  (Gastroesophageal Reflux Disease, Adult)  El reflujo gastroesofgico ocurre cuando el cido del estmago pasa al esfago. Cuando el cido entra en contacto con el esfago, el cido provoca dolor (inflamacin) en el esfago. Con el tiempo, pueden formarse pequeos agujeros (lceras) en el revestimiento del esfago. CAUSAS   Exceso de Engineer, site. Esto aplica presin [employer], lo que hace que el cido del estmago suba hacia el esfago.  El hbito de fumar Aumenta la produccin de cido en el [place].  El consumo de alcohol. Provoca disminucin de la presin en el esfnter esofgico inferior (vlvula o anillo de msculo entre el esfago y Product manager), permitiendo que el cido del estmago suba hacia el esfago.  Cenas a ltima hora del da y estmago lleno. Aumenta la presin y la produccin de cido en el estmago.  Malformacin en el esfnter esofgico inferior. A menudo no se halla causa.  SNTOMAS   Ardor y Aeronautical engineer parte inferior del pecho detrs del esternn y en la zona media del [place]. Puede ocurrir [employer] por semana o ms a menudo.  Dificultad para tragar.  Dolor de Investment banker, operational.  Tos seca.  Sntomas similares al asma que incluyen sensacin de opresin en el pecho, falta de aire y sibilancias. DIAGNSTICO  El mdico diagnosticar el problema basndose en los sntomas. En algunos casos, se indican radiografas y otras pruebas para verificar si hay complicaciones o para comprobar el estado del estmago y Education administrator.  TRATAMIENTO  El mdico le indicar medicamentos de venta libre o recetados para ayudar a disminuir la produccin de cido. Consulte con su mdico antes de Art gallery manager o agregar cualquier medicamento nuevo.  INSTRUCCIONES PARA EL CUIDADO EN EL  HOGAR   Modifique los factores que pueda cambiar. Consulte con su mdico para solicitar orientacin relacionada con la prdida de peso, dejar de fumar y el consumo de alcohol.  Evite las comidas y bebidas que empeoran los [place], [place]:  [place] con cafena o alcohlicas.  Chocolate.  Sabores a English as a second language teacher.  Ajo y cebolla.  Comidas muy condimentadas.  Ctricos como naranjas, limones o limas.  Alimentos que contengan tomate, como salsas, [place] y pizza.  Alimentos fritos y Radio broadcast assistant.  Evite acostarse durante 3 horas antes de irse a dormir o antes de tomar una siesta.  Haga comidas pequeas durante Psychiatrist de 3 comidas abundantes.  Use ropas sueltas. No use nada apretado alrededor de la cintura que cause presin en el estmago.  [place] (eleve) la cabecera de la cama 6 a 8 pulgadas (15 a 20 cm) con bloques de madera. Usar almohadas extra no ayuda.  Solo tome medicamentos que se pueden comprar sin receta o recetados para el dolor, Tree surgeon o fiebre, como le indica el mdico.  No tome aspirina, ibuprofeno ni antiinflamatorios no esteroides. [place] [facility] SI:   [employer], el cuello, la [place], los dientes o la espalda.  El dolor aumenta o cambia la intensidad o la durancin.  Tiene nuseas, vmitos o sudoracin(diaforesis).  Siente falta de aire o dolor en el pecho, o se desmaya.  Vomita y el vmito tiene [place], es de color [place], [place], negro o es similar a la borra del caf o tiene [place].  Las  heces son rojas, sanguinolentas o negras. Estos sntomas pueden ser signos de otros problemas, como enfermedades cardacas, hemorragias gstrias o sangrado esofgico.  ASEGRESE DE QUE:   Comprende estas instrucciones.  Controlar su enfermedad.  Solicitar ayuda de inmediato si no mejora o si empeora. Document Released: 05/16/2005 Document Revised: 10/29/2011 [facility] Patient Information 2014 [place], [place].

## 2013-12-17 NOTE — Progress Notes (Signed)
   Subjective:    Patient ID: Margaret Austin, female    DOB: Aug 10, 1956, 58 y.o.   MRN: [id number]  HPI EAR PAIN  Location: right and left Ear pain started: 3 months ago Pain is: constant Severity: moderate Medications tried: neomycin and using hydrogen peroxide recently  Recent ear trauma: no Prior ear surgeries: no Antibiotics in the last 30 days: yes History of diabetes: yes  Symptoms Ear discharge: no Fever: no Pain with chewing: yes Ringing in ears: yes Dizziness: yes Hearing loss: a little Rashes or blisters around ear: no Weight loss: no opposite has gained weight  Patient believes could be caused by: she does not know  DIABETES Disease Monitoring: Blood Sugar ranges-not checking Polyuria/phagia/dipsia- no      Visual problems- no Medications: Compliance- daily Hypoglycemic symptoms- no    Review of Symptoms - see HPI PMH - Smoking status noted.       Review of Systems     Objective:   Physical Exam right ear the canal is red and crusted Left ear the canal is red and crusty Neck is mildly tender bilaterally with no adenopathy No thyroidmegaly Heart - Regular rate and rhythm.  No murmurs, gallops or rubs.    No edema to her lower extremities          Assessment & Plan:    Ear Pain - appears to have allergic irritation in both canals likely due to medications and peroxide.  Use stteroid cream

## 2013-12-17 NOTE — Assessment & Plan Note (Signed)
Well controlled. Continue current meds

## 2014-03-10 ENCOUNTER — Ambulatory Visit: Payer: Self-pay | Attending: Internal Medicine | Admitting: Internal Medicine

## 2014-03-10 ENCOUNTER — Encounter: Payer: Self-pay | Admitting: Internal Medicine

## 2014-03-10 VITALS — BP 111/70 | HR 73 | Temp 98.2°F | Resp 15 | Wt 183.0 lb

## 2014-03-10 DIAGNOSIS — H538 Other visual disturbances: Secondary | ICD-10-CM

## 2014-03-10 DIAGNOSIS — M5441 Lumbago with sciatica, right side: Secondary | ICD-10-CM

## 2014-03-10 DIAGNOSIS — M5430 Sciatica, unspecified side: Secondary | ICD-10-CM

## 2014-03-10 DIAGNOSIS — E089 Diabetes mellitus due to underlying condition without complications: Secondary | ICD-10-CM

## 2014-03-10 DIAGNOSIS — E139 Other specified diabetes mellitus without complications: Secondary | ICD-10-CM

## 2014-03-10 LAB — POCT GLYCOSYLATED HEMOGLOBIN (HGB A1C): Hemoglobin A1C: 7

## 2014-03-10 LAB — GLUCOSE, POCT (MANUAL RESULT ENTRY): POC Glucose: 202 mg/dl — AB (ref 70–99)

## 2014-03-10 MED ORDER — GABAPENTIN 300 MG PO CAPS: 300.0000 mg | ORAL_CAPSULE | Freq: Every day | ORAL | Status: DC

## 2014-03-10 MED ORDER — METFORMIN HCL 1000 MG PO TABS: 1000.0000 mg | ORAL_TABLET | Freq: Two times a day (BID) | ORAL | Status: DC

## 2014-03-10 MED ORDER — IBUPROFEN 800 MG PO TABS: 800.0000 mg | ORAL_TABLET | Freq: Three times a day (TID) | ORAL | Status: DC | PRN

## 2014-03-10 MED ORDER — GLUCOCOM LANCETS 28G MISC: Status: DC

## 2014-03-10 NOTE — Progress Notes (Signed)
Patient here with interpreter Complains of pain to her right hip that shoots down her right leg Needs referral to opthalmology as well

## 2014-03-10 NOTE — Progress Notes (Signed)
MRN: [id number] Name: Margaret Austin  Sex: female Age: 58 y.o. DOB: 1955/11/29  Allergies: Review of patient's allergies indicates no known allergies.  Chief Complaint  Patient presents with  . Sciatica    HPI: Patient is 58 y.o. female who has to of diabetes comes today reported to have right lower back pain which radiates down to the leg for the last 3 weeks denies any recent fall or trauma, she has not taken any medication to help with the symptoms, denies any fever chills any weakness numbness or incontinence., Patient has been taking metformin 1 g twice a day has not been checking her blood sugar regularly today her hemoglobin A1c is 7.0% which is trending up. Patient has also reported to have blurry vision recently has not been evaluated by ophthalmologist.  Past Medical History  Diagnosis Date  . Diabetes mellitus without complication     History reviewed. No pertinent past surgical history.    Medication List       This list is accurate as of: 03/10/14  3:47 PM.  Always use your most recent med list.               gabapentin 300 MG capsule  Commonly known as:  NEURONTIN  Take 1 capsule (300 mg total) by mouth at bedtime.     hydrocortisone cream 1 %  Apply small amount with Q tip inside each ear twice daily     ibuprofen 800 MG tablet  Commonly known as:  ADVIL,MOTRIN  Take 1 tablet (800 mg total) by mouth every 8 (eight) hours as needed.     metFORMIN 1000 MG tablet  Commonly known as:  GLUCOPHAGE  Take 1 tablet (1,000 mg total) by mouth 2 (two) times daily with a meal.     omeprazole 40 MG capsule  Commonly known as:  PRILOSEC  Take 1 capsule (40 mg total) by mouth daily.        Meds ordered this encounter  Medications  . metFORMIN (GLUCOPHAGE) 1000 MG tablet    Sig: Take 1 tablet (1,000 mg total) by mouth 2 (two) times daily with a meal.    Dispense:  90 tablet    Refill:  3  . gabapentin (NEURONTIN) 300 MG capsule    Sig: Take 1 capsule (300 mg  total) by mouth at bedtime.    Dispense:  90 capsule    Refill:  1  . ibuprofen (ADVIL,MOTRIN) 800 MG tablet    Sig: Take 1 tablet (800 mg total) by mouth every 8 (eight) hours as needed.    Dispense:  30 tablet    Refill:  1     There is no immunization history on file for this patient.  History reviewed. No pertinent family history.  History  Substance Use Topics  . Smoking status: Never Smoker   . Smokeless tobacco: Not on file  . Alcohol Use: No    Review of Systems   As noted in HPI  Filed Vitals:   03/10/14 1511  BP: 111/70  Pulse: 73  Temp: 98.2 F (36.8 C)  Resp: 15    Physical Exam  Physical Exam  Constitutional: No distress.  Cardiovascular: Normal rate and regular rhythm.   Pulmonary/Chest: Breath sounds normal. No respiratory distress. She has no wheezes. She has no rales.  Musculoskeletal:  Right lower lumbar paraspinal tenderness, SLR+ve, equal strength both lower extremities, DTR2+    CBC    Component Value Date/Time   WBC 8.6 09/15/2013  1515   RBC 4.30 09/15/2013 1515   HGB 12.8 09/15/2013 1515   HCT 37.7 09/15/2013 1515   PLT 321 09/15/2013 1515   MCV 87.7 09/15/2013 1515   LYMPHSABS 3.5 09/15/2013 1515   MONOABS 0.6 09/15/2013 1515   EOSABS 0.1 09/15/2013 1515   BASOSABS 0.0 09/15/2013 1515    CMP     Component Value Date/Time   NA 138 09/15/2013 1515   K 3.1* 09/15/2013 1515   CL 104 09/15/2013 1515   CO2 29 09/15/2013 1515   GLUCOSE 93 09/15/2013 1515   BUN 9 09/15/2013 1515   CREATININE 0.56 09/15/2013 1515   CREATININE 0.41* 07/18/2013 2335   CALCIUM 8.5 09/15/2013 1515   PROT 6.6 09/15/2013 1515   ALBUMIN 3.9 09/15/2013 1515   AST 23 09/15/2013 1515   ALT 28 09/15/2013 1515   ALKPHOS 97 09/15/2013 1515   BILITOT 0.5 09/15/2013 1515   GFRNONAA >89 09/15/2013 1515   GFRNONAA >90 07/18/2013 2335   GFRAA >89 09/15/2013 1515   GFRAA >90 07/18/2013 2335    Lab Results  Component Value Date/Time   CHOL 144 09/15/2013  3:15 PM    No  components found with this basename: hga1c    Lab Results  Component Value Date/Time   AST 23 09/15/2013  3:15 PM    Assessment and Plan  Diabetes mellitus due to underlying condition without complications - Plan:  Results for orders placed in visit on 03/10/14  GLUCOSE, POCT (MANUAL RESULT ENTRY)      Result Value Ref Range   POC Glucose 202 (*) 70 - 99 mg/dl  POCT GLYCOSYLATED HEMOGLOBIN (HGB A1C)      Result Value Ref Range   Hemoglobin A1C 7.0     Hemoglobin A1c is trending up, I have advised patient for diabetes meal planning, she'll continue with metformin 1 g twice a day, advise patient to keep the fingerstick log. Ambulatory referral to Ophthalmology, metFORMIN (GLUCOPHAGE) 1000 MG tablet  Blurry vision - Plan: Ambulatory referral to Ophthalmology  Right-sided low back pain with right-sided sciatica - Plan: Advised patient to apply heating pad prescribed gabapentin (NEURONTIN) 300 MG capsule each bedtime, ibuprofen (ADVIL,MOTRIN) 800 MG tablet when necessary for pain   Return in about 3 months (around 06/10/2014) for diabetes.  Lorayne Marek, MD

## 2014-03-10 NOTE — Patient Instructions (Addendum)
La diabetes mellitus y los alimentos (Diabetes Mellitus and Food) Es importante que controle su nivel de azcar en la sangre (glucosa). El nivel de glucosa en sangre depende en gran medida de lo que usted come. [place] alimentos saludables en las cantidades [place] a lo largo del Training and development officer, aproximadamente a la misma hora [employer], lo ayudar a Chief Technology Officer su nivel de Multimedia programmer. Tambin puede ayudarlo a retrasar o Patent attorney de la diabetes mellitus. [place] de [employer] saludable incluso puede ayudarlo a Chartered loss adjuster de presin arterial y a Science writer o Theatre manager un peso saludable.  CMO PUEDEN AFECTARME LOS ALIMENTOS? Carbohidratos Los carbohidratos afectan el nivel de glucosa en sangre ms que cualquier otro tipo de alimento. El nutricionista lo ayudar a Teacher, adult education cuntos carbohidratos puede consumir en cada comida y ensearle a contarlos. El recuento de carbohidratos es importante para mantener la glucosa en sangre en un nivel saludable, en especial si utiliza insulina o toma determinados medicamentos para la diabetes mellitus. Alcohol El alcohol puede provocar disminuciones sbitas de la glucosa en sangre (hipoglucemia), en especial si utiliza insulina o toma determinados medicamentos para la diabetes mellitus. La hipoglucemia es una afeccin que puede poner en peligro la vida. Los sntomas de la hipoglucemia (somnolencia, mareos y Data processing manager) son similares a los sntomas de haber consumido mucho alcohol.  Si el mdico lo autoriza a beber alcohol, hgalo con moderacin y siga estas pautas:  Las mujeres no deben beber ms de un trago por da, y los hombres no deben beber ms de dos tragos por Training and development officer. Un trago es igual a:  12 onzas (355 ml) de cerveza  5 onzas de vino (150 ml) de vino  1,5onzas (35ml) de bebidas espirituosas  No beba con el estmago vaco.  Mantngase hidratado. Beba agua, gaseosas dietticas o t helado sin azcar.  Las gaseosas comunes, los jugos y  otros refrescos podran contener muchos carbohidratos y se Civil Service fast streamer. QU ALIMENTOS NO SE RECOMIENDAN? Cuando haga las elecciones de alimentos, es importante que recuerde que todos los alimentos son distintos. Algunos tienen menos nutrientes que otros por porcin, aunque podran tener la misma cantidad de caloras o carbohidratos. Es difcil darle al cuerpo lo que necesita cuando consume alimentos con menos nutrientes. Estos son algunos ejemplos de alimentos que debera evitar ya que contienen muchas caloras y carbohidratos, pero pocos nutrientes:  Physicist, medical trans (la mayora de los alimentos procesados incluyen grasas trans en la etiqueta de Informacin nutricional).  Gaseosas comunes.  Jugos.  Caramelos.  Dulces, como tortas, pasteles, rosquillas y [place].  Comidas fritas. QU ALIMENTOS PUEDO COMER? Consuma alimentos ricos en nutrientes, que nutrirn el cuerpo y lo mantendrn saludable. Los alimentos que debe comer tambin dependern de varios factores, como:  Las caloras que necesita.  Los medicamentos que toma.  Su peso.  El nivel de glucosa en [place].  El [place] de presin arterial.  El nivel de colesterol. Tambin debe consumir una variedad de [place], como:  Protenas, como carne, aves, pescado, tofu, frutos secos y semillas (las protenas de [place] magros son mejores).  [place].  Verduras.  Productos lcteos, como [place], queso y yogur (descremados son mejores).  Panes, granos, pastas, cereales, arroz y frijoles.  Grasas, como aceite de [place], [place] sin grasas trans, aceite de canola, aguacate y [place]. TODOS LOS QUE PADECEN DIABETES MELLITUS TIENEN EL [place] PLAN DE [place]? Dado que todas las personas que padecen diabetes mellitus son distintas, no hay un solo plan de comidas que funcione para todos. Es muy  importante que se rena con un nutricionista que lo ayudar a crear un plan de comidas adecuado para usted. Document Released: 11/13/2007  Document Revised: 08/11/2013 [facility] Patient Information 2015 [place]. This information is not intended to replace advice given to you by your health care provider. Make sure you discuss any questions you have with your health care provider.

## 2014-04-06 ENCOUNTER — Ambulatory Visit: Payer: Self-pay | Attending: Internal Medicine

## 2014-04-09 ENCOUNTER — Encounter: Payer: Self-pay | Admitting: Internal Medicine

## 2014-04-17 ENCOUNTER — Emergency Department (HOSPITAL_COMMUNITY): Payer: Worker's Compensation

## 2014-04-17 ENCOUNTER — Emergency Department (HOSPITAL_COMMUNITY)
Admission: EM | Admit: 2014-04-17 | Discharge: 2014-04-17 | Disposition: A | Discharge status: Home / Self Care | Payer: Worker's Compensation | Attending: Emergency Medicine | Admitting: Emergency Medicine

## 2014-04-17 DIAGNOSIS — W010XXA Fall on same level from slipping, tripping and stumbling without subsequent striking against object, initial encounter: Secondary | ICD-10-CM | POA: Diagnosis not present

## 2014-04-17 DIAGNOSIS — Y9289 Other specified places as the place of occurrence of the external cause: Secondary | ICD-10-CM | POA: Diagnosis not present

## 2014-04-17 DIAGNOSIS — Z79899 Other long term (current) drug therapy: Secondary | ICD-10-CM | POA: Diagnosis not present

## 2014-04-17 DIAGNOSIS — Y9389 Activity, other specified: Secondary | ICD-10-CM | POA: Insufficient documentation

## 2014-04-17 DIAGNOSIS — S5000XA Contusion of unspecified elbow, initial encounter: Secondary | ICD-10-CM | POA: Insufficient documentation

## 2014-04-17 DIAGNOSIS — R109 Unspecified abdominal pain: Secondary | ICD-10-CM | POA: Insufficient documentation

## 2014-04-17 DIAGNOSIS — E119 Type 2 diabetes mellitus without complications: Secondary | ICD-10-CM | POA: Diagnosis not present

## 2014-04-17 DIAGNOSIS — T148XXA Other injury of unspecified body region, initial encounter: Secondary | ICD-10-CM

## 2014-04-17 DIAGNOSIS — S72109A Unspecified trochanteric fracture of unspecified femur, initial encounter for closed fracture: Secondary | ICD-10-CM | POA: Insufficient documentation

## 2014-04-17 DIAGNOSIS — S8000XA Contusion of unspecified knee, initial encounter: Secondary | ICD-10-CM | POA: Diagnosis not present

## 2014-04-17 DIAGNOSIS — S20229A Contusion of unspecified back wall of thorax, initial encounter: Secondary | ICD-10-CM | POA: Diagnosis not present

## 2014-04-17 DIAGNOSIS — S72122A Displaced fracture of lesser trochanter of left femur, initial encounter for closed fracture: Secondary | ICD-10-CM

## 2014-04-17 DIAGNOSIS — S8990XA Unspecified injury of unspecified lower leg, initial encounter: Secondary | ICD-10-CM | POA: Insufficient documentation

## 2014-04-17 DIAGNOSIS — S20219A Contusion of unspecified front wall of thorax, initial encounter: Secondary | ICD-10-CM | POA: Insufficient documentation

## 2014-04-17 DIAGNOSIS — S99919A Unspecified injury of unspecified ankle, initial encounter: Secondary | ICD-10-CM

## 2014-04-17 DIAGNOSIS — Z792 Long term (current) use of antibiotics: Secondary | ICD-10-CM | POA: Insufficient documentation

## 2014-04-17 DIAGNOSIS — W19XXXA Unspecified fall, initial encounter: Secondary | ICD-10-CM

## 2014-04-17 DIAGNOSIS — S99929A Unspecified injury of unspecified foot, initial encounter: Secondary | ICD-10-CM

## 2014-04-17 LAB — CBG MONITORING, ED: Glucose-Capillary: 115 mg/dL — ABNORMAL HIGH (ref 70–99)

## 2014-04-17 MED ORDER — HYDROCODONE-ACETAMINOPHEN 5-325 MG PO TABS
1.0000 | ORAL_TABLET | Freq: Once | ORAL | Status: AC
  Administered 2014-04-17: 1 via ORAL
  Filled 2014-04-17: qty 1

## 2014-04-17 MED ORDER — OXYCODONE-ACETAMINOPHEN 5-325 MG PO TABS: 1.0000 | ORAL_TABLET | ORAL | Status: DC | PRN

## 2014-04-17 NOTE — Discharge Instructions (Signed)
Contusin (Contusion) Una contusin es un hematoma profundo. Las contusiones son el resultado de una lesin que causa sangrado debajo de la piel. La zona de la contusin puede ponerse [place], [place] o [place]. Las lesiones menores causarn contusiones sin Social research officer, government, Armed forces training and education officer las ms graves pueden presentar dolor e inflamacin durante un par de semanas.  CAUSAS  Generalmente, una contusin se debe a un golpe, un traumatismo o una fuerza directa en una zona del cuerpo. SNTOMAS   Hinchazn y enrojecimiento en la zona de la lesin.  Hematomas en la zona de la lesin.  Dolor con la palpacin y sensibilidad en la zona de la lesin.  Dolor. DIAGNSTICO  Se puede establecer el diagnstico al hacer una historia clnica y un examen fsico. Letitia Caul vez sea necesario hacer una radiografa, una tomografa computarizada o una resonancia magntica para determinar si hay lesiones asociadas, como fracturas. TRATAMIENTO  El tratamiento especfico depender de la zona del cuerpo donde se produjo la lesin. En general, el mejor tratamiento para una contusin es el reposo, la aplicacin de hielo, la elevacin de la zona y la aplicacin de compresas fras en la zona de la lesin. Para calmar el dolor tambin podrn recomendarle medicamentos de venta libre. Pregntele al mdico cul es el mejor tratamiento para su contusin. INSTRUCCIONES PARA EL CUIDADO EN EL HOGAR   Aplique hielo sobre la zona lesionada.  Ponga el hielo en una bolsa plstica.  Colquese una toalla entre la piel y la bolsa de hielo.  Deje el hielo durante 15 a 25minutos, 3 a 4veces por da, o [place] los medicamentos de venta libre o recetados para Glass blower/designer, el malestar o la [place], segn se lo indique el mdico. El mdico podr indicarle que evite tomar antiinflamatorios (aspirina, ibuprofeno y naproxeno) durante 60 horas ya que estos medicamentos pueden aumentar los hematomas.  Mantenga la zona de la lesin  en reposo.  Si es posible, eleve la zona de la lesin para reducir la hinchazn. SOLICITE ATENCIN MDICA DE INMEDIATO SI:   El hematoma o la hinchazn aumentan.  Siente dolor que [place].  La hinchazn o el dolor no se [employer]. ASEGRESE DE QUE:   Comprende estas instrucciones.  Controlar su afeccin.  Recibir ayuda de inmediato si no mejora o si empeora. Document Released: 05/16/2005 Document Revised: 08/11/2013 [facility] Patient Information 2015 [place]. This information is not intended to replace advice given to you by your health care provider. Make sure you discuss any questions you have with your health care provider. Esguince  (Sprain) Un esguince es un desgarro en uno de los tejidos fuertes y fibrosos (ligamentos) que unen los [place]. La gravedad del esguince depende de cunto ligamento se rompe. La ruptura puede ser parcial o completa.  CAUSAS  A menudo, los esguinces son el resultado de una cada o de un accidente. La fuerza del impacto hace que las fibras del ligamento se estiren ms de su largo normal. Este exceso de tensin es la causa de que las fibras del ligamento se rompan.  SNTOMAS  Podr sentir prdida del movimiento o aumento del dolor dentro de su rango normal de movimientos. Otros sntomas son:   Hematomas.  Sensibilidad.  Hinchazn. DIAGNSTICO  Con el fin de diagnosticar el esguince, el mdico lo examinar para Research officer, trade union de desgarro del ligamento. El mdico tambin puede indicar una radiografa para asegurarse de que no haya huesos rotos.  TRATAMIENTO  Si el ligamento est slo  parcialmente roto, el tratamiento generalmente consiste en mantener la zona lesionada en una posicin fija (immovilizacin) durante un corto perodo. Para ello, el mdico aplicar un vendaje, yeso, o frula para impedir que la zona se mueva hasta que se cure. Para un ligamento parcialmente roto, el proceso de curacin generalmente demora de 2  a 3 semanas.  Si el ligamento est completamente roto, podr necesitar una ciruga para volver a unirlo al [employer]. Despus de la Designer, jewellery un yeso o una frula y tendr que usarlos durante 4 a 6 semanas, mientras el ligamento se [place].  INSTRUCCIONES PARA EL CUIDADO EN EL HOGAR   Mantenga elevada la zona lesionada para disminuir la hinchazn.  Para Best boy y la hinchazn, aplique hielo en la articulacin dos veces por da, durante 2 a 3 das.  Ponga el hielo en una bolsa plstica.  Colquese una toalla entre la piel y la bolsa de hielo.  Deje el hielo en el lugar durante 15 minutos.  Tome slo medicamentos de venta libre o recetados para Glass blower/designer, segn las indicaciones del mdico.  No deje sin proteccin la zona lesionada hasta que el dolor y la rigidez desaparezcan (generalmente entre 3 a 4 semanas).  No deje que el yeso o la frula se mojen. Cbralos con una bolsa plstica cuando se d un bao o una ducha. No debe practicar natacin.  El mdico podr indicarle ejercicios especiales para que haga durante la recuperacin, para Product/process development scientist o Administrator, arts rigidez [place]. SOLICITE ATENCIN MDICA DE INMEDIATO SI:   El yeso o la frula se daan.  El dolor [place]. ASEGRESE DE QUE:   Comprende estas instrucciones.  Controlar su enfermedad.  Solicitar ayuda de inmediato si no mejora o si empeora. Document Released: 05/16/2005 Document Revised: 10/29/2011 [facility] Patient Information 2015 [place]. This information is not intended to replace advice given to you by your health care provider. Make sure you discuss any questions you have with your health care provider.

## 2014-04-17 NOTE — ED Provider Notes (Signed)
Assumed care in sign out with imaging pending. As below. Discussed with ortho with regards to lesser troch avulsion. WBAT. PRN pain meds. As needed ortho FU. Discussed with pt/family.   Dg Chest 2 View  04/17/2014   CLINICAL DATA:  Fall.  EXAM: CHEST  2 VIEW  COMPARISON:  None.  FINDINGS: The heart size and mediastinal contours are within normal limits. Both lungs are clear. The visualized skeletal structures are unremarkable.  IMPRESSION: No active cardiopulmonary disease.   Electronically Signed   By: Rolm Baptise M.D.   On: 04/17/2014 18:03   Dg Cervical Spine Complete  04/17/2014   CLINICAL DATA:  History of trauma from a fall.  EXAM: CERVICAL SPINE  4+ VIEWS  COMPARISON:  No priors.  FINDINGS: Study is limited by patient's body habitus, which obscures the lower cervical spine (C6 and C7) on the lateral projection. C6 and C7 are normal in appearance on the oblique and frontal projections. With these limitations in mind, no definite acute displaced fracture of the cervical spine is noted. Alignment appears anatomic. Prevertebral soft tissues are normal. Multilevel degenerative disc disease, most severe at C5-C6. Mild multilevel facet arthropathy.  IMPRESSION: 1. Limited evaluation demonstrating no definite acute radiographic abnormality of the cervical spine. 2. Degenerative disc disease and cervical, as above.   Electronically Signed   By: Vinnie Langton M.D.   On: 04/17/2014 17:59   Dg Thoracic Spine 2 View  04/17/2014   CLINICAL DATA:  Fall.  Pain.  EXAM: THORACIC SPINE - 2 VIEW  COMPARISON:  None.  FINDINGS: Twelve rib-bearing thoracic type vertebral bodies are present. Vertebral body heights and alignment are normal. Mild endplate degenerative changes are noted throughout the thoracolumbar spine. No acute fracture or traumatic subluxation is evident.  IMPRESSION: 1. Mild degenerative change. 2. No acute abnormality.   Electronically Signed   By: Lawrence Santiago M.D.   On: 04/17/2014 18:01   Dg  Lumbar Spine Complete  04/17/2014   CLINICAL DATA:  Fall.  EXAM: LUMBAR SPINE - COMPLETE 4+ VIEW  COMPARISON:  CT 07/19/2013.  FINDINGS: Paraspinal soft tissues are normal. Calcifications right upper quadrant consistent with gallstones. Normal bony alignment and mineralization. Diffuse degenerative change. No evidence of acute abnormality. No fracture.  IMPRESSION: 1.  Gallstones.  2. Lumbar spine DJD. No no acute abnormality, no evidence of fracture or malalignment.   Electronically Signed   By: Marcello Moores  Register   On: 04/17/2014 18:01   Dg Pelvis 1-2 Views  04/17/2014   CLINICAL DATA:  Fall.  EXAM: PELVIS - 1-2 VIEW  COMPARISON:  CT 07/19/2013.  FINDINGS: Degenerative changes lumbar spine both hips. No acute bony abnormality identified.  IMPRESSION: DJD, no acute abnormality.   Electronically Signed   By: Marcello Moores  Register   On: 04/17/2014 18:03   Dg Elbow Complete Left  04/17/2014   CLINICAL DATA:  58 year old female with left elbow injury and pain.  EXAM: LEFT ELBOW - COMPLETE 3+ VIEW  COMPARISON:  None.  FINDINGS: There is no evidence of fracture, dislocation, or joint effusion. There is no evidence of arthropathy or other focal bone abnormality. Soft tissues are unremarkable.  IMPRESSION: Negative.   Electronically Signed   By: Hassan Rowan M.D.   On: 04/17/2014 18:05   Dg Femur Left  04/17/2014   CLINICAL DATA:  Fall.  Pain.  EXAM: LEFT FEMUR - 2 VIEW  COMPARISON:  CT abdomen pelvis 07/19/2013.  FINDINGS: The knee and hip joints are located. A bone fragment adjacent  to the lesser trochanter was not present on the prior CT scan. This could represent an evulsion injury. No other acute fracture is evident.  IMPRESSION: Bone fragment adjacent to the lesser trochanter is concerning for and evulsion at injury.  No additional fractures.   Electronically Signed   By: Lawrence Santiago M.D.   On: 04/17/2014 18:04   Dg Tibia/fibula Left  04/17/2014   CLINICAL DATA:  Fall.  EXAM: LEFT TIBIA AND FIBULA - 2 VIEW   COMPARISON:  None.  FINDINGS: There is no evidence of fracture or other focal bone lesions. Soft tissues are unremarkable.  IMPRESSION: Negative.   Electronically Signed   By: Rolm Baptise M.D.   On: 04/17/2014 18:02   Dg Knee Complete 4 Views Left  04/17/2014   CLINICAL DATA:  Fall  EXAM: LEFT KNEE - COMPLETE 4+ VIEW  COMPARISON:  None.  FINDINGS: There is no evidence of fracture, dislocation, or joint effusion. There is no evidence of arthropathy or other focal bone abnormality. Soft tissues are unremarkable.  IMPRESSION: Negative.   Electronically Signed   By: Rolm Baptise M.D.   On: 04/17/2014 18:03    Virgel Manifold, MD 04/17/14 [phone]

## 2014-04-17 NOTE — ED Provider Notes (Signed)
CSN: [id number]     Arrival date & time 04/17/14  1426 History   First MD Initiated Contact with Patient 04/17/14 1428     Chief Complaint  Patient presents with  . Fall     (Consider location/radiation/quality/duration/timing/severity/associated sxs/prior Treatment) Patient is a 58 y.o. female presenting with fall.  Fall This is a new problem. The current episode started 1 to 2 hours ago. The problem occurs constantly. The problem has not changed since onset.Associated symptoms include abdominal pain. Pertinent negatives include no chest pain and no shortness of breath. Nothing aggravates the symptoms. Nothing relieves the symptoms.    Past Medical History  Diagnosis Date  . Diabetes mellitus without complication    No past surgical history on file. No family history on file. History  Substance Use Topics  . Smoking status: Never Smoker   . Smokeless tobacco: Not on file  . Alcohol Use: No   OB History   Grav Para Term Preterm Abortions TAB SAB Ect Mult Living                 Review of Systems  Constitutional: Negative for fever and chills.  HENT: Negative for congestion, rhinorrhea and sore throat.   Eyes: Negative for photophobia and visual disturbance.  Respiratory: Negative for cough and shortness of breath.   Cardiovascular: Negative for chest pain and leg swelling.  Gastrointestinal: Positive for abdominal pain. Negative for nausea, vomiting, diarrhea and constipation.  Endocrine: Negative for polyphagia and polyuria.  Genitourinary: Negative for dysuria, flank pain, vaginal bleeding, vaginal discharge and enuresis.  Musculoskeletal: Negative for back pain and gait problem.  Skin: Negative for color change and rash.  Neurological: Negative for dizziness, syncope, light-headedness and numbness.  Hematological: Negative for adenopathy. Does not bruise/bleed easily.  All other systems reviewed and are negative.     Allergies  Review of patient's allergies  indicates no known allergies.  Home Medications   Prior to Admission medications   Medication Sig Start Date End Date Taking? Authorizing Provider  azithromycin (ZITHROMAX) 250 MG tablet Take 250 mg by mouth daily. Take 2 tablets on Day 1 and 1 tablet for the next 4 days.  Five day course, on Day 3.   Yes Historical Provider, MD  gabapentin (NEURONTIN) 300 MG capsule Take 1 capsule (300 mg total) by mouth at bedtime. 03/10/14  Yes Lorayne Marek, MD  metFORMIN (GLUMETZA) 1000 MG (MOD) 24 hr tablet Take 1,000 mg by mouth daily with lunch.   Yes Historical Provider, MD  omeprazole (PRILOSEC) 40 MG capsule Take 1 capsule (40 mg total) by mouth daily. 12/17/13  Yes Lind Covert, MD  GlucoCom Lancets MISC Use as directed 03/10/14   Lorayne Marek, MD  oxyCODONE-acetaminophen (PERCOCET/ROXICET) 5-325 MG per tablet Take 1 tablet by mouth every 4 (four) hours as needed for severe pain. 04/17/14   Virgel Manifold, MD   BP 120/70  Pulse 71  Temp(Src) 97.9 F (36.6 C) (Oral)  Resp 19  SpO2 99% Physical Exam  Vitals reviewed. Constitutional: She is oriented to person, place, and time. She appears well-developed and well-nourished.  HENT:  Head: Normocephalic and atraumatic.  Right Ear: External ear normal.  Left Ear: External ear normal.  Eyes: Conjunctivae and EOM are normal. Pupils are equal, round, and reactive to light.  Neck: Normal range of motion. Neck supple.  Cardiovascular: Normal rate, regular rhythm, normal heart sounds and intact distal pulses.   Pulmonary/Chest: Effort normal and breath sounds normal.  Abdominal: Soft. Bowel  sounds are normal. There is no tenderness.  Musculoskeletal: Normal range of motion.       Left elbow: She exhibits normal range of motion, no swelling, no effusion, no deformity and no laceration. Tenderness found.       Left hip: She exhibits tenderness and bony tenderness.       Left knee: She exhibits normal range of motion, no swelling, no effusion and no  ecchymosis. Tenderness found.       Cervical back: She exhibits tenderness and bony tenderness.       Thoracic back: She exhibits tenderness and bony tenderness.       Lumbar back: She exhibits tenderness and bony tenderness.  Neurological: She is alert and oriented to person, place, and time.  Skin: Skin is warm and dry.    ED Course  Procedures (including critical care time) Labs Review Labs Reviewed  CBG MONITORING, ED - Abnormal; Notable for the following:    Glucose-Capillary 115 (*)    All other components within normal limits    Imaging Review Dg Chest 2 View  04/17/2014   CLINICAL DATA:  Fall.  EXAM: CHEST  2 VIEW  COMPARISON:  None.  FINDINGS: The heart size and mediastinal contours are within normal limits. Both lungs are clear. The visualized skeletal structures are unremarkable.  IMPRESSION: No active cardiopulmonary disease.   Electronically Signed   By: Rolm Baptise M.D.   On: 04/17/2014 18:03   Dg Cervical Spine Complete  04/17/2014   CLINICAL DATA:  History of trauma from a fall.  EXAM: CERVICAL SPINE  4+ VIEWS  COMPARISON:  No priors.  FINDINGS: Study is limited by patient's body habitus, which obscures the lower cervical spine (C6 and C7) on the lateral projection. C6 and C7 are normal in appearance on the oblique and frontal projections. With these limitations in mind, no definite acute displaced fracture of the cervical spine is noted. Alignment appears anatomic. Prevertebral soft tissues are normal. Multilevel degenerative disc disease, most severe at C5-C6. Mild multilevel facet arthropathy.  IMPRESSION: 1. Limited evaluation demonstrating no definite acute radiographic abnormality of the cervical spine. 2. Degenerative disc disease and cervical, as above.   Electronically Signed   By: Vinnie Langton M.D.   On: 04/17/2014 17:59   Dg Thoracic Spine 2 View  04/17/2014   CLINICAL DATA:  Fall.  Pain.  EXAM: THORACIC SPINE - 2 VIEW  COMPARISON:  None.  FINDINGS: Twelve  rib-bearing thoracic type vertebral bodies are present. Vertebral body heights and alignment are normal. Mild endplate degenerative changes are noted throughout the thoracolumbar spine. No acute fracture or traumatic subluxation is evident.  IMPRESSION: 1. Mild degenerative change. 2. No acute abnormality.   Electronically Signed   By: Lawrence Santiago M.D.   On: 04/17/2014 18:01   Dg Lumbar Spine Complete  04/17/2014   CLINICAL DATA:  Fall.  EXAM: LUMBAR SPINE - COMPLETE 4+ VIEW  COMPARISON:  CT 07/19/2013.  FINDINGS: Paraspinal soft tissues are normal. Calcifications right upper quadrant consistent with gallstones. Normal bony alignment and mineralization. Diffuse degenerative change. No evidence of acute abnormality. No fracture.  IMPRESSION: 1.  Gallstones.  2. Lumbar spine DJD. No no acute abnormality, no evidence of fracture or malalignment.   Electronically Signed   By: Marcello Moores  Register   On: 04/17/2014 18:01   Dg Pelvis 1-2 Views  04/17/2014   CLINICAL DATA:  Fall.  EXAM: PELVIS - 1-2 VIEW  COMPARISON:  CT 07/19/2013.  FINDINGS: Degenerative changes lumbar  spine both hips. No acute bony abnormality identified.  IMPRESSION: DJD, no acute abnormality.   Electronically Signed   By: Marcello Moores  Register   On: 04/17/2014 18:03   Dg Elbow Complete Left  04/17/2014   CLINICAL DATA:  58 year old female with left elbow injury and pain.  EXAM: LEFT ELBOW - COMPLETE 3+ VIEW  COMPARISON:  None.  FINDINGS: There is no evidence of fracture, dislocation, or joint effusion. There is no evidence of arthropathy or other focal bone abnormality. Soft tissues are unremarkable.  IMPRESSION: Negative.   Electronically Signed   By: Hassan Rowan M.D.   On: 04/17/2014 18:05   Dg Femur Left  04/17/2014   CLINICAL DATA:  Fall.  Pain.  EXAM: LEFT FEMUR - 2 VIEW  COMPARISON:  CT abdomen pelvis 07/19/2013.  FINDINGS: The knee and hip joints are located. A bone fragment adjacent to the lesser trochanter was not present on the prior CT  scan. This could represent an evulsion injury. No other acute fracture is evident.  IMPRESSION: Bone fragment adjacent to the lesser trochanter is concerning for and evulsion at injury.  No additional fractures.   Electronically Signed   By: Lawrence Santiago M.D.   On: 04/17/2014 18:04   Dg Tibia/fibula Left  04/17/2014   CLINICAL DATA:  Fall.  EXAM: LEFT TIBIA AND FIBULA - 2 VIEW  COMPARISON:  None.  FINDINGS: There is no evidence of fracture or other focal bone lesions. Soft tissues are unremarkable.  IMPRESSION: Negative.   Electronically Signed   By: Rolm Baptise M.D.   On: 04/17/2014 18:02   Dg Knee Complete 4 Views Left  04/17/2014   CLINICAL DATA:  Fall  EXAM: LEFT KNEE - COMPLETE 4+ VIEW  COMPARISON:  None.  FINDINGS: There is no evidence of fracture, dislocation, or joint effusion. There is no evidence of arthropathy or other focal bone abnormality. Soft tissues are unremarkable.  IMPRESSION: Negative.   Electronically Signed   By: Rolm Baptise M.D.   On: 04/17/2014 18:03     EKG Interpretation None      MDM   Final diagnoses:  Fall, initial encounter  Contusion  Closed avulsion fracture of lesser trochanter of femur, left, initial encounter    58 y.o. female with pertinent PMH of DM presents with diffuse left sided pain after falling from standing after slipping at work.  No loc or concussive symptoms.  Pt in LSB and towel, no ccollar on arrival.  Physical exam as above with diffuse l sided tenderness.  Unable to place ccollar due to habitus.  Labs and imaging as above reviewed. Pt care to Dr. Wilson Singer pending final dispo.   1. Fall, initial encounter   2. Contusion   3. Closed avulsion fracture of lesser trochanter of femur, left, initial encounter         Debby Freiberg, MD 04/18/14 [phone]

## 2014-04-17 NOTE — ED Notes (Signed)
Patient was at work at a laundry facility. EMS reports that patient slipped on some soap and fell. C/O pain in left hip and  low back pain.  Patient on LSB with C collar. No LOC.  50 mcg fentanyl given IV X 2 by EMS for 10/10  Left hip pain.

## 2014-04-23 ENCOUNTER — Encounter (HOSPITAL_COMMUNITY): Payer: Self-pay | Admitting: Emergency Medicine

## 2014-04-23 ENCOUNTER — Emergency Department (HOSPITAL_COMMUNITY)
Admission: EM | Admit: 2014-04-23 | Discharge: 2014-04-24 | Disposition: A | Discharge status: Home / Self Care | Payer: Worker's Compensation | Attending: Emergency Medicine | Admitting: Emergency Medicine

## 2014-04-23 DIAGNOSIS — J0110 Acute frontal sinusitis, unspecified: Secondary | ICD-10-CM | POA: Diagnosis not present

## 2014-04-23 DIAGNOSIS — E119 Type 2 diabetes mellitus without complications: Secondary | ICD-10-CM | POA: Diagnosis not present

## 2014-04-23 DIAGNOSIS — R51 Headache: Secondary | ICD-10-CM | POA: Diagnosis present

## 2014-04-23 DIAGNOSIS — M25559 Pain in unspecified hip: Secondary | ICD-10-CM | POA: Diagnosis not present

## 2014-04-23 DIAGNOSIS — S7012XS Contusion of left thigh, sequela: Secondary | ICD-10-CM

## 2014-04-23 DIAGNOSIS — IMO0002 Reserved for concepts with insufficient information to code with codable children: Secondary | ICD-10-CM | POA: Insufficient documentation

## 2014-04-23 DIAGNOSIS — Z79899 Other long term (current) drug therapy: Secondary | ICD-10-CM | POA: Diagnosis not present

## 2014-04-23 DIAGNOSIS — R509 Fever, unspecified: Secondary | ICD-10-CM | POA: Diagnosis not present

## 2014-04-23 DIAGNOSIS — G8911 Acute pain due to trauma: Secondary | ICD-10-CM | POA: Diagnosis not present

## 2014-04-23 DIAGNOSIS — M25552 Pain in left hip: Secondary | ICD-10-CM

## 2014-04-23 LAB — CBG MONITORING, ED: Glucose-Capillary: 148 mg/dL — ABNORMAL HIGH (ref 70–99)

## 2014-04-23 NOTE — ED Notes (Signed)
Patient arrives with complaint of left hip pain and headache. Points to generalized head, left groin, and left upper leg when asked about location of pain. Family states that pain has been severe at home causing patient to have hot flashes.  Explains that she was seen here several days ago after a fall. At that time she was diagnosed with a fracture of the left femur and was instructed to follow-up with primary care and orthopedics. Patient was not able to go to primary care do to insurance issues, but presented to urgent care for re-evaluation.

## 2014-04-24 ENCOUNTER — Emergency Department (HOSPITAL_COMMUNITY): Payer: Worker's Compensation

## 2014-04-24 LAB — URINALYSIS, ROUTINE W REFLEX MICROSCOPIC
Bilirubin Urine: NEGATIVE
GLUCOSE, UA: NEGATIVE mg/dL
HGB URINE DIPSTICK: NEGATIVE
KETONES UR: NEGATIVE mg/dL
LEUKOCYTES UA: NEGATIVE
Nitrite: NEGATIVE
PH: 6.5 (ref 5.0–8.0)
PROTEIN: NEGATIVE mg/dL
Specific Gravity, Urine: 1.013 (ref 1.005–1.030)
Urobilinogen, UA: 1 mg/dL (ref 0.0–1.0)

## 2014-04-24 LAB — CBC WITH DIFFERENTIAL/PLATELET
Basophils Absolute: 0 10*3/uL (ref 0.0–0.1)
Basophils Relative: 0 % (ref 0–1)
Eosinophils Absolute: 0.3 10*3/uL (ref 0.0–0.7)
Eosinophils Relative: 2 % (ref 0–5)
HEMATOCRIT: 37.1 % (ref 36.0–46.0)
HEMOGLOBIN: 12.5 g/dL (ref 12.0–15.0)
LYMPHS ABS: 3.4 10*3/uL (ref 0.7–4.0)
LYMPHS PCT: 33 % (ref 12–46)
MCH: 30.3 pg (ref 26.0–34.0)
MCHC: 33.7 g/dL (ref 30.0–36.0)
MCV: 89.8 fL (ref 78.0–100.0)
MONOS PCT: 6 % (ref 3–12)
Monocytes Absolute: 0.7 10*3/uL (ref 0.1–1.0)
NEUTROS ABS: 6 10*3/uL (ref 1.7–7.7)
NEUTROS PCT: 59 % (ref 43–77)
Platelets: 368 10*3/uL (ref 150–400)
RBC: 4.13 MIL/uL (ref 3.87–5.11)
RDW: 12.8 % (ref 11.5–15.5)
WBC: 10.4 10*3/uL (ref 4.0–10.5)

## 2014-04-24 LAB — APTT: aPTT: 30 seconds (ref 24–37)

## 2014-04-24 LAB — COMPREHENSIVE METABOLIC PANEL
ALK PHOS: 99 U/L (ref 39–117)
ALT: 21 U/L (ref 0–35)
AST: 21 U/L (ref 0–37)
Albumin: 3.7 g/dL (ref 3.5–5.2)
Anion gap: 13 (ref 5–15)
BILIRUBIN TOTAL: 2 mg/dL — AB (ref 0.3–1.2)
BUN: 11 mg/dL (ref 6–23)
CHLORIDE: 101 meq/L (ref 96–112)
CO2: 25 mEq/L (ref 19–32)
Calcium: 9.1 mg/dL (ref 8.4–10.5)
Creatinine, Ser: 0.58 mg/dL (ref 0.50–1.10)
GFR calc Af Amer: >90 mL/min (ref >90)
GFR calc non Af Amer: >90 mL/min (ref >90)
GLUCOSE: 128 mg/dL — AB (ref 70–99)
POTASSIUM: 4.2 meq/L (ref 3.7–5.3)
Sodium: 139 mEq/L (ref 137–147)
Total Protein: 7.5 g/dL (ref 6.0–8.3)

## 2014-04-24 LAB — PROTIME-INR
INR: 0.99 (ref 0.00–1.49)
Prothrombin Time: 13.1 seconds (ref 11.6–15.2)

## 2014-04-24 MED ORDER — OXYMETAZOLINE HCL 0.05 % NA SOLN: 1.0000 | Freq: Two times a day (BID) | NASAL | Status: DC

## 2014-04-24 MED ORDER — MOMETASONE FUROATE 50 MCG/ACT NA SUSP: 2.0000 | Freq: Every day | NASAL | Status: DC

## 2014-04-24 NOTE — ED Provider Notes (Signed)
CSN: [id number]     Arrival date & time 04/23/14  1953 History   First MD Initiated Contact with Patient 04/23/14 2333     Chief Complaint  Patient presents with  . Hip Pain  . Headache     (Consider location/radiation/quality/duration/timing/severity/associated sxs/prior Treatment) HPI Patient was seen in the emergency department on 8/29 after slip and fall from standing. She was diagnosed with a lesser trochanteric fracture of the left femur. He was discharged home with pain medication. Patient represents for persistent uncontrolled pain. States she takes her Percocet which controls the pain but only for a short time period. She's been unable to weight-bear and ambulates with crutches. She has increased swelling and bruising to the left thigh since the fall. She also is complaining of subjective fevers and lower abdominal pain with dysuria and frequency. Patient complains of cough but denies production, chest pain or shortness of breath. She's also had a mild frontal headache. She describes as throbbing in nature. It was gradual onset. No photophobia, nausea or vomiting. No neck pain or stiffness.   Past Medical History  Diagnosis Date  . Diabetes mellitus without complication    History reviewed. No pertinent past surgical history. History reviewed. No pertinent family history. History  Substance Use Topics  . Smoking status: Never Smoker   . Smokeless tobacco: Not on file  . Alcohol Use: No   OB History   Grav Para Term Preterm Abortions TAB SAB Ect Mult Living                 Review of Systems  Constitutional: Positive for fever and chills.  HENT: Positive for sinus pressure and sore throat.   Respiratory: Positive for cough.   Cardiovascular: Negative for chest pain.  Gastrointestinal: Negative for nausea, vomiting, abdominal pain and diarrhea.  Genitourinary: Positive for dysuria and frequency.  Musculoskeletal: Positive for arthralgias and myalgias. Negative for back  pain, neck pain and neck stiffness.  Skin: Positive for wound.  Neurological: Positive for headaches. Negative for dizziness, syncope, weakness, light-headedness and numbness.  All other systems reviewed and are negative.     Allergies  Review of patient's allergies indicates no known allergies.  Home Medications   Prior to Admission medications   Medication Sig Start Date End Date Taking? Authorizing Provider  docusate sodium (RA COL-RITE) 100 MG capsule Take 100 mg by mouth daily.   Yes Historical Provider, MD  metFORMIN (GLUMETZA) 1000 MG (MOD) 24 hr tablet Take 1,000 mg by mouth 2 (two) times daily.    Yes Historical Provider, MD  oxyCODONE-acetaminophen (PERCOCET/ROXICET) 5-325 MG per tablet Take 1 tablet by mouth every 4 (four) hours as needed for severe pain. 04/17/14  Yes Virgel Manifold, MD  mometasone (NASONEX) 50 MCG/ACT nasal spray Place 2 sprays into the nose daily. 04/24/14   Julianne Rice, MD  oxymetazoline (AFRIN NASAL SPRAY) 0.05 % nasal spray Place 1 spray into both nostrils 2 (two) times daily. 04/24/14   Julianne Rice, MD   BP 101/54  Pulse 79  Temp(Src) 97.8 F (36.6 C) (Oral)  Resp 19  SpO2 99% Physical Exam  Nursing note and vitals reviewed. Constitutional: She is oriented to person, place, and time. She appears well-developed and well-nourished. No distress.  HENT:  Head: Normocephalic and atraumatic.  Mouth/Throat: Oropharynx is clear and moist.  Bilateral nasal mucosal edema. Bilateral frontal sinus tenderness to percussion.  Eyes: EOM are normal. Pupils are equal, round, and reactive to light.  Neck: Normal range of motion.  Neck supple.  No posterior midline cervical tenderness to palpation.  Cardiovascular: Normal rate and regular rhythm.   Pulmonary/Chest: Effort normal and breath sounds normal. No respiratory distress. She has no wheezes. She has no rales. She exhibits no tenderness.  Abdominal: Soft. Bowel sounds are normal. She exhibits no  distension and no mass. There is tenderness (very mild suprapubic tenderness to palpation. There is no rebound or guarding.). There is no rebound and no guarding.  Musculoskeletal: Normal range of motion. She exhibits tenderness (tenderness to palpation over the left lateral hip. Range of motion.). She exhibits no edema.  Ecchymosis noted to the distal left femur and the posterior thigh. Mild warmth without erythema. Distal pulses 2+. No calf swelling or tenderness.  Neurological: She is alert and oriented to person, place, and time.  Patient is alert and oriented x3 with clear, goal oriented speech. Patient has 5/5 motor in all extremities. Sensation is intact to light touch.   Skin: Skin is warm and dry. No rash noted. No erythema.  Psychiatric: She has a normal mood and affect. Her behavior is normal.    ED Course  Procedures (including critical care time) Labs Review Labs Reviewed  COMPREHENSIVE METABOLIC PANEL - Abnormal; Notable for the following:    Glucose, Bld 128 (*)    Total Bilirubin 2.0 (*)    All other components within normal limits  URINALYSIS, ROUTINE W REFLEX MICROSCOPIC - Abnormal; Notable for the following:    Color, Urine AMBER (*)    APPearance CLOUDY (*)    All other components within normal limits  CBG MONITORING, ED - Abnormal; Notable for the following:    Glucose-Capillary 148 (*)    All other components within normal limits  CBC WITH DIFFERENTIAL  PROTIME-INR  APTT    Imaging Review No results found.   EKG Interpretation None      MDM   Final diagnoses:  Acute frontal sinusitis, recurrence not specified  Left hip pain  Thigh contusion, left, sequela   Patient's symptoms have improved. She's been given return precautions and is voiced understanding.     Julianne Rice, MD 04/26/14 [phone]

## 2014-04-24 NOTE — Discharge Instructions (Signed)
Sinusitis  (Sinusitis)  La sinusitis es la irritacin, dolor, e hinchazn (inflamacin) de las cavidades de aire en los huesos de la cara (senos paranasales). La irritacin, Conservation officer, historic buildings, e hinchazn puede hacer que el aire y el moco se queden atrapados en los senos paranasales. Esto hace que los grmenes se multipliquen y causen una infeccin.  CUIDADOS EN EL HOGAR   Beba gran cantidad de lquido para mantener el pis (orina) de tono claro o amarillo plido.  Use un humidificador en su hogar.  Deje correr el agua caliente de la ducha para producir vapor en el bao. Sintese en el bao con la puerta cerrada. Inhale vapor de agua 3 a 4 veces al da.  Ponga un pao caliente y hmedo en el rostro 3 a 4 veces al da, o segn las indicaciones de su mdico.  Use aerosoles de agua salada (aerosoles de solucin salina) para Air cabin crew las secreciones nasales espesas. Esto puede ayudar al drenaje de los senos paranasales.  Slo tome los medicamentos que le indique el mdico. SOLICITE AYUDA DE INMEDIATO SI:   El dolor empeora.  Siente un dolor de cabeza intenso.  Tiene Higher education careers adviser (nuseas).  Vomita.  Tiene mucho sueo (somnolencia).  El rostro est inflamado (hinchado).  Hay cambios en la visin.  Presenta rigidez en el cuello.  Tiene dificultad para respirar. ASEGRESE DE QUE:   Comprende estas instrucciones.  Controlar su enfermedad.  Solicitar ayuda de inmediato si no mejora o si empeora. Document Released: 04/30/2012 [facility] Patient Information 2015 [place]. This information is not intended to replace advice given to you by your health care provider. Make sure you discuss any questions you have with your health care provider.  Dolor de cadera (Hip Pain) La cadera es la articulacin entre la parte superior de las piernas y la parte inferior de la pelvis. Los [employer], Charity fundraiser, los tendones y los msculos de la articulacin de la cadera trabajan arduamente cada  da para sostener el peso del cuerpo y Engineer, water. El Social research officer, government de cadera puede tener distintos grados, desde un dolor leve hasta un dolor intenso en uno o ambos lados de la cadera. El dolor puede sentirse en la parte interna de la articulacin de la cadera, cerca de la ingle, o en la parte externa, cerca de los glteos y la parte superior de los muslos. Tambin puede estar acompaado de hinchazn o entumecimiento.  [place] los medicamentos solamente como se lo haya indicado el mdico.  Aplique hielo sobre la zona lesionada.  Ponga el hielo en una bolsa plstica.  Coloque una toalla entre la piel y la bolsa de hielo.  Aplique el hielo de 3 a 4 veces por da, durante 15 a [id number] en cada aplicacin.  Mantenga la pierna levantada (elevada) siempre que sea posible, para reducir la hinchazn.  Evite las actividades que Financial risk analyst.  Siga los ejercicios especficos segn las indicaciones del mdico.  Duerma con una almohada entre las piernas del lado que le sea ms cmodo.  Anote la frecuencia con la que tiene dolor en la cadera, la ubicacin del dolor y lo que siente. SOLICITE ATENCIN MDICA SI:   No puede apoyar el peso del cuerpo [employer].  La cadera est enrojecida o hinchada, o muy dolorida con la palpacin.  El dolor o la hinchazn continan o empeoran despus de 1semana.  Tiene una creciente dificultad para caminar.  Tiene fiebre. [place]  INMEDIATO SI:   Se ha cado.  El dolor y la hinchazn de la cadera aumentan de repente. ASEGRESE DE QUE:   Comprende estas instrucciones.  Controlar su afeccin.  Recibir ayuda de inmediato si no mejora o si empeora. Document Released: 12/21/2013 [facility] Patient Information 2015 [place]. This information is not intended to replace advice given to you by your health care provider. Make sure you discuss any questions you have with your  health care provider. Contusin  (Contusion)  Una contusin es un hematoma interno. Las contusiones ocurren cuando un traumatismo causa un sangrado debajo de la piel. Los signos de hematoma son dolor, inflamacin (hinchazn) y cambio de color en la piel. La contusin [employer], prpura o [place]. CUIDADOS EN EL HOGAR   Aplique hielo sobre la zona lesionada.  Ponga el hielo en una bolsa plstica.  Colquese una toalla entre la piel y la bolsa de hielo.  Deje el hielo durante 15 a 20 minutos, 3 a 4 veces por da.  Slo tome los medicamentos segn le indique el mdico.  Haga que la zona lesionada repose.  En lo posible, levante (eleve) la zona lesionada para disminuir la hinchazn. SOLICITE AYUDA DE INMEDIATO SI:   El hematoma o la hinchazn aumentan.  Siente que Printmaker.  La hinchazn o el dolor no se [employer]. ASEGRESE DE QUE:   Comprende estas instrucciones.  Controlar su enfermedad.  Solicitar ayuda de inmediato si no mejora o si empeora. Document Released: 07/26/2011 Document Revised: 10/29/2011 [facility] Patient Information 2015 [place]. This information is not intended to replace advice given to you by your health care provider. Make sure you discuss any questions you have with your health care provider.

## 2014-06-29 ENCOUNTER — Encounter: Payer: Self-pay | Admitting: Internal Medicine

## 2014-06-29 ENCOUNTER — Ambulatory Visit: Payer: Self-pay | Attending: Internal Medicine | Admitting: Internal Medicine

## 2014-06-29 VITALS — BP 139/80 | HR 73 | Temp 98.1°F | Resp 16

## 2014-06-29 DIAGNOSIS — E139 Other specified diabetes mellitus without complications: Secondary | ICD-10-CM

## 2014-06-29 DIAGNOSIS — J320 Chronic maxillary sinusitis: Secondary | ICD-10-CM

## 2014-06-29 DIAGNOSIS — J029 Acute pharyngitis, unspecified: Secondary | ICD-10-CM

## 2014-06-29 DIAGNOSIS — R0981 Nasal congestion: Secondary | ICD-10-CM

## 2014-06-29 LAB — POCT RAPID STREP A (OFFICE): RAPID STREP A SCREEN: NEGATIVE

## 2014-06-29 LAB — GLUCOSE, POCT (MANUAL RESULT ENTRY)
POC GLUCOSE: 335 mg/dL — AB (ref 70–99)
POC Glucose: 288 mg/dl — AB (ref 70–99)

## 2014-06-29 LAB — POCT GLYCOSYLATED HEMOGLOBIN (HGB A1C): Hemoglobin A1C: 7.1

## 2014-06-29 MED ORDER — METFORMIN HCL 1000 MG PO TABS: 1000.0000 mg | ORAL_TABLET | Freq: Two times a day (BID) | ORAL | Status: DC

## 2014-06-29 MED ORDER — SULFAMETHOXAZOLE-TRIMETHOPRIM 800-160 MG PO TABS: 1.0000 | ORAL_TABLET | Freq: Two times a day (BID) | ORAL | Status: DC

## 2014-06-29 MED ORDER — INSULIN ASPART 100 UNIT/ML ~~LOC~~ SOLN
10.0000 [IU] | Freq: Once | SUBCUTANEOUS | Status: AC
  Administered 2014-06-29: 10 [IU] via SUBCUTANEOUS

## 2014-06-29 MED ORDER — FLUTICASONE PROPIONATE 50 MCG/ACT NA SUSP: 2.0000 | Freq: Every day | NASAL | Status: DC

## 2014-06-29 NOTE — Progress Notes (Signed)
MRN: [id number] Name: Margaret Austin  Sex: female Age: 58 y.o. DOB: [birth year]  Allergies: Review of patient's allergies indicates no known allergies.  Chief Complaint  Patient presents with  . Sore Throat    HPI: Patient is 58 y.o. female who history of diabetes comes today reported to have sore throat nasal congestion postnasal drip minimal productive cough ongoing for the last 2-3 months as per patient she was prescribed Nasonex in the ER but she could not afford it and has not filled the prescription, does complain of some fever and chills, she denies smoking cigarettes, for diabetes she has been taking metformin thousand milligram twice a day, she denies any hypoglycemic symptoms, her blood sugar was elevated, she was given insulin, she's requesting refill on her medications, she also reported to have surgery done on her back in the past and has noticed residual hole ?had some drainage in the past.  Past Medical History  Diagnosis Date  . Diabetes mellitus without complication     History reviewed. No pertinent past surgical history.    Medication List       This list is accurate as of: 06/29/14  1:02 PM.  Always use your most recent med list.               fluticasone 50 MCG/ACT nasal spray  Commonly known as:  FLONASE  Place 2 sprays into both nostrils daily.     metFORMIN 1000 MG (MOD) 24 hr tablet  Commonly known as:  GLUMETZA  Take 1,000 mg by mouth 2 (two) times daily.     metFORMIN 1000 MG tablet  Commonly known as:  GLUCOPHAGE  Take 1 tablet (1,000 mg total) by mouth 2 (two) times daily with a meal.     mometasone 50 MCG/ACT nasal spray  Commonly known as:  NASONEX  Place 2 sprays into the nose daily.     oxyCODONE-acetaminophen 5-325 MG per tablet  Commonly known as:  PERCOCET/ROXICET  Take 1 tablet by mouth every 4 (four) hours as needed for severe pain.     oxymetazoline 0.05 % nasal spray  Commonly known as:  AFRIN NASAL SPRAY  Place 1  spray into both nostrils 2 (two) times daily.     RA COL-RITE 100 MG capsule  Generic drug:  docusate sodium  Take 100 mg by mouth daily.     sulfamethoxazole-trimethoprim 800-160 MG per tablet  Commonly known as:  BACTRIM DS,SEPTRA DS  Take 1 tablet by mouth 2 (two) times daily.        Meds ordered this encounter  Medications  . insulin aspart (novoLOG) injection 10 Units    Sig:   . fluticasone (FLONASE) 50 MCG/ACT nasal spray    Sig: Place 2 sprays into both nostrils daily.    Dispense:  16 g    Refill:  6  . sulfamethoxazole-trimethoprim (BACTRIM DS,SEPTRA DS) 800-160 MG per tablet    Sig: Take 1 tablet by mouth 2 (two) times daily.    Dispense:  30 tablet    Refill:  0  . metFORMIN (GLUCOPHAGE) 1000 MG tablet    Sig: Take 1 tablet (1,000 mg total) by mouth 2 (two) times daily with a meal.    Dispense:  180 tablet    Refill:  3     There is no immunization history on file for this patient.  History reviewed. No pertinent family history.  History  Substance Use Topics  . Smoking status: Never Smoker   .  Smokeless tobacco: Not on file  . Alcohol Use: No    Review of Systems   As noted in HPI  Filed Vitals:   06/29/14 1241  BP: 139/80  Pulse: 73  Temp: 98.1 F (36.7 C)  Resp: 16    Physical Exam  Physical Exam  HENT:  Nasal congestion sinus tenderness, minimal pharyngeal erythema no exudate  Eyes: EOM are normal. Pupils are equal, round, and reactive to light.  Cardiovascular: Normal rate and regular rhythm.   Pulmonary/Chest: Breath sounds normal. No respiratory distress. She has no wheezes. She has no rales.  Lymphadenopathy:    She has cervical adenopathy.  Skin:  Upper back small gap in the skin no  surrounding erythema or signs of infection    CBC    Component Value Date/Time   WBC 10.4 04/24/2014 0019   RBC 4.13 04/24/2014 0019   HGB 12.5 04/24/2014 0019   HCT 37.1 04/24/2014 0019   PLT 368 04/24/2014 0019   MCV 89.8 04/24/2014  0019   LYMPHSABS 3.4 04/24/2014 0019   MONOABS 0.7 04/24/2014 0019   EOSABS 0.3 04/24/2014 0019   BASOSABS 0.0 04/24/2014 0019    CMP     Component Value Date/Time   NA 139 04/24/2014 0019   K 4.2 04/24/2014 0019   CL 101 04/24/2014 0019   CO2 25 04/24/2014 0019   GLUCOSE 128* 04/24/2014 0019   BUN 11 04/24/2014 0019   CREATININE 0.58 04/24/2014 0019   CREATININE 0.56 09/15/2013 1515   CALCIUM 9.1 04/24/2014 0019   PROT 7.5 04/24/2014 0019   ALBUMIN 3.7 04/24/2014 0019   AST 21 04/24/2014 0019   ALT 21 04/24/2014 0019   ALKPHOS 99 04/24/2014 0019   BILITOT 2.0* 04/24/2014 0019   GFRNONAA >90 04/24/2014 0019   GFRNONAA >89 09/15/2013 1515   GFRAA >90 04/24/2014 0019   GFRAA >89 09/15/2013 1515    Lab Results  Component Value Date/Time   CHOL 144 09/15/2013 03:15 PM    No components found for: HGA1C  Lab Results  Component Value Date/Time   AST 21 04/24/2014 12:19 AM    Assessment and Plan  Chronic maxillary sinusitis - Plan: sulfamethoxazole-trimethoprim (BACTRIM DS,SEPTRA DS) 800-160 MG per tablet, advised patient for saltwater gargles. Prescribed Flonase.  Other specified diabetes mellitus without complications - Plan:  Results for orders placed or performed in visit on 06/29/14  Glucose (CBG)  Result Value Ref Range   POC Glucose 335 (A) 70 - 99 mg/dl  HgB A1c  Result Value Ref Range   Hemoglobin A1C 7.1   Rapid Strep A  Result Value Ref Range   Rapid Strep A Screen Negative Negative  Glucose (CBG)  Result Value Ref Range   POC Glucose 288 (A) 70 - 99 mg/dl   Diabetes is fairly well controlled her hemoglobin A1c is 7.1%, advised patient for diabetes meal planning continued current meds. insulin aspart (novoLOG) injection 10 Units, metFORMIN (GLUCOPHAGE) 1000 MG tablet, Glucose (CBG)  Sore throat - Plan: Rapid Strep A is negative, will sendThroat culture ([facility]), sulfamethoxazole-trimethoprim (BACTRIM DS,SEPTRA DS) 800-160 MG per tablet  Nasal  congestion - Plan: fluticasone (FLONASE) 50 MCG/ACT nasal spray     Return in about 3 months (around 09/29/2014) for diabetes.  Lorayne Marek, MD

## 2014-06-29 NOTE — Patient Instructions (Addendum)
La diabetes mellitus y los alimentos (Diabetes Mellitus and Food) Es importante que controle su nivel de azcar en la sangre (glucosa). El nivel de glucosa en sangre depende en gran medida de lo que usted come. [place] alimentos saludables en las cantidades [place] a lo largo del Training and development officer, aproximadamente a la misma hora [employer], lo ayudar a Chief Technology Officer su nivel de Multimedia programmer. Tambin puede ayudarlo a retrasar o Patent attorney de la diabetes mellitus. [place] de [employer] saludable incluso puede ayudarlo a Chartered loss adjuster de presin arterial y a Science writer o Theatre manager un peso saludable.  CMO PUEDEN AFECTARME LOS ALIMENTOS? Carbohidratos Los carbohidratos afectan el nivel de glucosa en sangre ms que cualquier otro tipo de alimento. El nutricionista lo ayudar a Teacher, adult education cuntos carbohidratos puede consumir en cada comida y ensearle a contarlos. El recuento de carbohidratos es importante para mantener la glucosa en sangre en un nivel saludable, en especial si utiliza insulina o toma determinados medicamentos para la diabetes mellitus. Alcohol El alcohol puede provocar disminuciones sbitas de la glucosa en sangre (hipoglucemia), en especial si utiliza insulina o toma determinados medicamentos para la diabetes mellitus. La hipoglucemia es una afeccin que puede poner en peligro la vida. Los sntomas de la hipoglucemia (somnolencia, mareos y Data processing manager) son similares a los sntomas de haber consumido mucho alcohol.  Si el mdico lo autoriza a beber alcohol, hgalo con moderacin y siga estas pautas:  Las mujeres no deben beber ms de un trago por da, y los hombres no deben beber ms de dos tragos por Training and development officer. Un trago es igual a:  12 onzas (355 ml) de cerveza  5 onzas de vino (150 ml) de vino  1,5onzas (35ml) de bebidas espirituosas  No beba con el estmago vaco.  Mantngase hidratado. Beba agua, gaseosas dietticas o t helado sin azcar.  Las gaseosas comunes, los jugos y  otros refrescos podran contener muchos carbohidratos y se Civil Service fast streamer. QU ALIMENTOS NO SE RECOMIENDAN? Cuando haga las elecciones de alimentos, es importante que recuerde que todos los alimentos son distintos. Algunos tienen menos nutrientes que otros por porcin, aunque podran tener la misma cantidad de caloras o carbohidratos. Es difcil darle al cuerpo lo que necesita cuando consume alimentos con menos nutrientes. Estos son algunos ejemplos de alimentos que debera evitar ya que contienen muchas caloras y carbohidratos, pero pocos nutrientes:  Physicist, medical trans (la mayora de los alimentos procesados incluyen grasas trans en la etiqueta de Informacin nutricional).  Gaseosas comunes.  Jugos.  Caramelos.  Dulces, como tortas, pasteles, rosquillas y [place].  Comidas fritas. QU ALIMENTOS PUEDO COMER? Consuma alimentos ricos en nutrientes, que nutrirn el cuerpo y lo mantendrn saludable. Los alimentos que debe comer tambin dependern de varios factores, como:  Las caloras que necesita.  Los medicamentos que toma.  Su peso.  El nivel de glucosa en [place].  El [place] de presin arterial.  El nivel de colesterol. Tambin debe consumir una variedad de [place], como:  Protenas, como carne, aves, pescado, tofu, frutos secos y semillas (las protenas de [place] magros son mejores).  Lambert Mody.  Verduras.  Productos lcteos, como [place], queso y yogur (descremados son mejores).  Panes, granos, pastas, cereales, arroz y frijoles.  Grasas, como aceite de [place], [place] sin grasas trans, aceite de canola, aguacate y [place]. TODOS LOS QUE PADECEN DIABETES MELLITUS TIENEN EL [place] PLAN DE [place]? Dado que todas las personas que padecen diabetes mellitus son distintas, no hay un solo plan de comidas que funcione para todos. Es muy  importante que se rena con un nutricionista que lo ayudar a crear un plan de comidas adecuado para usted. Document Released: 11/13/2007  Document Revised: 08/11/2013 ExitCare Patient Information 2015 [employer]. This information is not intended to replace advice given to you by your health care provider. Make sure you discuss any questions you have with your health care provider.  

## 2014-06-29 NOTE — Progress Notes (Signed)
Patient here for follow up on her diabetes Complains of sore throat and right ear pain Having some mild back pain as well Has a small hole to her back that  Has some drainage at times

## 2014-07-01 LAB — CULTURE, GROUP A STREP: ORGANISM ID, BACTERIA: NORMAL

## 2014-09-04 ENCOUNTER — Emergency Department (HOSPITAL_COMMUNITY): Payer: Self-pay

## 2014-09-04 ENCOUNTER — Emergency Department (HOSPITAL_COMMUNITY)
Admission: EM | Admit: 2014-09-04 | Discharge: 2014-09-05 | Disposition: A | Discharge status: Home / Self Care | Payer: Self-pay | Attending: Emergency Medicine | Admitting: Emergency Medicine

## 2014-09-04 ENCOUNTER — Encounter (HOSPITAL_COMMUNITY): Payer: Self-pay | Admitting: *Deleted

## 2014-09-04 DIAGNOSIS — B9689 Other specified bacterial agents as the cause of diseases classified elsewhere: Secondary | ICD-10-CM

## 2014-09-04 DIAGNOSIS — R739 Hyperglycemia, unspecified: Secondary | ICD-10-CM

## 2014-09-04 DIAGNOSIS — Z7951 Long term (current) use of inhaled steroids: Secondary | ICD-10-CM | POA: Insufficient documentation

## 2014-09-04 DIAGNOSIS — Z79899 Other long term (current) drug therapy: Secondary | ICD-10-CM | POA: Insufficient documentation

## 2014-09-04 DIAGNOSIS — H9209 Otalgia, unspecified ear: Secondary | ICD-10-CM | POA: Insufficient documentation

## 2014-09-04 DIAGNOSIS — R51 Headache: Secondary | ICD-10-CM | POA: Insufficient documentation

## 2014-09-04 DIAGNOSIS — E1165 Type 2 diabetes mellitus with hyperglycemia: Secondary | ICD-10-CM | POA: Insufficient documentation

## 2014-09-04 DIAGNOSIS — J029 Acute pharyngitis, unspecified: Secondary | ICD-10-CM | POA: Insufficient documentation

## 2014-09-04 DIAGNOSIS — R112 Nausea with vomiting, unspecified: Secondary | ICD-10-CM | POA: Insufficient documentation

## 2014-09-04 DIAGNOSIS — N760 Acute vaginitis: Secondary | ICD-10-CM | POA: Insufficient documentation

## 2014-09-04 LAB — CBC WITH DIFFERENTIAL/PLATELET
Basophils Absolute: 0 10*3/uL (ref 0.0–0.1)
Basophils Relative: 0 % (ref 0–1)
EOS PCT: 1 % (ref 0–5)
Eosinophils Absolute: 0.1 10*3/uL (ref 0.0–0.7)
HEMATOCRIT: 37.7 % (ref 36.0–46.0)
Hemoglobin: 13.1 g/dL (ref 12.0–15.0)
LYMPHS PCT: 37 % (ref 12–46)
Lymphs Abs: 4 10*3/uL (ref 0.7–4.0)
MCH: 30 pg (ref 26.0–34.0)
MCHC: 34.7 g/dL (ref 30.0–36.0)
MCV: 86.3 fL (ref 78.0–100.0)
Monocytes Absolute: 0.7 10*3/uL (ref 0.1–1.0)
Monocytes Relative: 7 % (ref 3–12)
NEUTROS PCT: 55 % (ref 43–77)
Neutro Abs: 5.9 10*3/uL (ref 1.7–7.7)
Platelets: 261 10*3/uL (ref 150–400)
RBC: 4.37 MIL/uL (ref 3.87–5.11)
RDW: 12.1 % (ref 11.5–15.5)
WBC: 10.7 10*3/uL — ABNORMAL HIGH (ref 4.0–10.5)

## 2014-09-04 LAB — URINALYSIS, ROUTINE W REFLEX MICROSCOPIC
Bilirubin Urine: NEGATIVE
HGB URINE DIPSTICK: NEGATIVE
Ketones, ur: 15 mg/dL — AB
Leukocytes, UA: NEGATIVE
Nitrite: NEGATIVE
PH: 6.5 (ref 5.0–8.0)
Protein, ur: NEGATIVE mg/dL
Specific Gravity, Urine: 1.025 (ref 1.005–1.030)
Urobilinogen, UA: 0.2 mg/dL (ref 0.0–1.0)

## 2014-09-04 LAB — URINE MICROSCOPIC-ADD ON

## 2014-09-04 LAB — WET PREP, GENITAL
Trich, Wet Prep: NONE SEEN
YEAST WET PREP: NONE SEEN

## 2014-09-04 LAB — HEPATIC FUNCTION PANEL
ALT: 34 U/L (ref 0–35)
AST: 22 U/L (ref 0–37)
Albumin: 3.8 g/dL (ref 3.5–5.2)
Alkaline Phosphatase: 157 U/L — ABNORMAL HIGH (ref 39–117)
BILIRUBIN TOTAL: 1.1 mg/dL (ref 0.3–1.2)
Bilirubin, Direct: 0.1 mg/dL (ref 0.0–0.3)
Indirect Bilirubin: 1 mg/dL — ABNORMAL HIGH (ref 0.3–0.9)
TOTAL PROTEIN: 7 g/dL (ref 6.0–8.3)

## 2014-09-04 LAB — BASIC METABOLIC PANEL
Anion gap: 7 (ref 5–15)
BUN: 11 mg/dL (ref 6–23)
CO2: 28 mmol/L (ref 19–32)
Calcium: 8.7 mg/dL (ref 8.4–10.5)
Chloride: 98 mEq/L (ref 96–112)
Creatinine, Ser: 0.56 mg/dL (ref 0.50–1.10)
GFR calc Af Amer: >90 mL/min (ref >90)
GFR calc non Af Amer: >90 mL/min (ref >90)
Glucose, Bld: 336 mg/dL — ABNORMAL HIGH (ref 70–99)
POTASSIUM: 4 mmol/L (ref 3.5–5.1)
Sodium: 133 mmol/L — ABNORMAL LOW (ref 135–145)

## 2014-09-04 LAB — CBG MONITORING, ED: Glucose-Capillary: 261 mg/dL — ABNORMAL HIGH (ref 70–99)

## 2014-09-04 LAB — RAPID STREP SCREEN (MED CTR MEBANE ONLY): Streptococcus, Group A Screen (Direct): NEGATIVE

## 2014-09-04 LAB — LIPASE, BLOOD: Lipase: 26 U/L (ref 11–59)

## 2014-09-04 MED ORDER — METRONIDAZOLE 500 MG PO TABS
500.0000 mg | ORAL_TABLET | Freq: Once | ORAL | Status: AC
  Administered 2014-09-04: 500 mg via ORAL
  Filled 2014-09-04: qty 1

## 2014-09-04 MED ORDER — ACETAMINOPHEN 500 MG PO TABS
1000.0000 mg | ORAL_TABLET | Freq: Once | ORAL | Status: AC
  Administered 2014-09-04: 1000 mg via ORAL
  Filled 2014-09-04 (×2): qty 2

## 2014-09-04 MED ORDER — SODIUM CHLORIDE 0.9 % IV BOLUS (SEPSIS)
1000.0000 mL | Freq: Once | INTRAVENOUS | Status: AC
  Administered 2014-09-04: 1000 mL via INTRAVENOUS

## 2014-09-04 NOTE — ED Notes (Addendum)
Pt from home reporting sore throat, bilateral ear pain, and nasal congestion x 4 days.  Reports feeling warm at home.  Afebrile in triage. Pt also reports having a yeast infection x 10 days.  Bought OTC medication with no relief.

## 2014-09-04 NOTE — ED Notes (Signed)
Pt to xray

## 2014-09-04 NOTE — ED Provider Notes (Signed)
CSN: [id number]     Arrival date & time 09/04/14  1927 History   First MD Initiated Contact with Patient 09/04/14 2017     Chief Complaint  Patient presents with  . Sore Throat  . Vaginal Discharge     (Consider location/radiation/quality/duration/timing/severity/associated sxs/prior Treatment) HPI  59 year old female presents with multiple complaints. History is taken with help of phone interpreter. Primary complaint appears to be sore throat for the past 4 days. His been having cough, congestion, and headaches. Has been having a fever, highest temperature was 101. Took Tylenol with good relief. Also endorses vaginal itching for the past 10 days and is concerned that is a recurrent yeast infection. Has not seen any discharge. She has a history of diabetes and thinks it is from this. The patient states she checked her glucose today at home and it was "high". The patient also endorses epigastric abdominal pain while eating for the past 3-4 days. It also occurred when not eating but is steadily worse with eating. Denies any back pain. Has had some burning with urination. Vomited once today but feels no current nausea. No chest pain.  Past Medical History  Diagnosis Date  . Diabetes mellitus without complication    History reviewed. No pertinent past surgical history. History reviewed. No pertinent family history. History  Substance Use Topics  . Smoking status: Never Smoker   . Smokeless tobacco: Not on file  . Alcohol Use: No   OB History    No data available     Review of Systems  Constitutional: Positive for fever and chills.  HENT: Positive for congestion, ear pain, rhinorrhea and sore throat.   Respiratory: Positive for cough.   Cardiovascular: Negative for chest pain.  Gastrointestinal: Positive for nausea, vomiting and abdominal pain. Negative for diarrhea.  Genitourinary: Positive for dysuria and menstrual problem (has gone through and completed menopause). Negative for  vaginal bleeding and vaginal discharge.       Vaginal itching  Musculoskeletal: Negative for back pain.  Neurological: Positive for dizziness and headaches.  All other systems reviewed and are negative.     Allergies  Review of patient's allergies indicates no known allergies.  Home Medications   Prior to Admission medications   Medication Sig Start Date End Date Taking? Authorizing Provider  docusate sodium (RA COL-RITE) 100 MG capsule Take 100 mg by mouth daily.    Historical Provider, MD  fluticasone (FLONASE) 50 MCG/ACT nasal spray Place 2 sprays into both nostrils daily. 06/29/14   Lorayne Marek, MD  metFORMIN (GLUCOPHAGE) 1000 MG tablet Take 1 tablet (1,000 mg total) by mouth 2 (two) times daily with a meal. 06/29/14   Lorayne Marek, MD  metFORMIN (GLUMETZA) 1000 MG (MOD) 24 hr tablet Take 1,000 mg by mouth 2 (two) times daily.     Historical Provider, MD  mometasone (NASONEX) 50 MCG/ACT nasal spray Place 2 sprays into the nose daily. 04/24/14   Julianne Rice, MD  oxyCODONE-acetaminophen (PERCOCET/ROXICET) 5-325 MG per tablet Take 1 tablet by mouth every 4 (four) hours as needed for severe pain. 04/17/14   Virgel Manifold, MD  oxymetazoline (AFRIN NASAL SPRAY) 0.05 % nasal spray Place 1 spray into both nostrils 2 (two) times daily. 04/24/14   Julianne Rice, MD  sulfamethoxazole-trimethoprim (BACTRIM DS,SEPTRA DS) 800-160 MG per tablet Take 1 tablet by mouth 2 (two) times daily. 06/29/14   Lorayne Marek, MD   BP 122/57 mmHg  Pulse 79  Temp(Src) 97.9 F (36.6 C) (Oral)  Resp 18  SpO2 96% Physical Exam  Constitutional: She is oriented to person, place, and time. She appears well-developed and well-nourished.  HENT:  Head: Normocephalic and atraumatic.  Right Ear: External ear normal.  Left Ear: External ear normal.  Nose: Nose normal.  Mouth/Throat: Uvula is midline and oropharynx is clear and moist. No oropharyngeal exudate or posterior oropharyngeal erythema.  Eyes: Right eye  exhibits no discharge. Left eye exhibits no discharge.  Neck: Neck supple.  Cardiovascular: Normal rate, regular rhythm and normal heart sounds.   Pulmonary/Chest: Effort normal and breath sounds normal. She has no wheezes. She has no rales.  Abdominal: Soft. There is tenderness in the epigastric area.    Genitourinary: Cervix exhibits no discharge and no friability. Vaginal discharge (mild white discharge) found.  Neurological: She is alert and oriented to person, place, and time.  Skin: Skin is warm and dry.  Nursing note and vitals reviewed.   ED Course  Procedures (including critical care time) Labs Review Labs Reviewed  WET PREP, GENITAL - Abnormal; Notable for the following:    Clue Cells Wet Prep HPF POC MODERATE (*)    WBC, Wet Prep HPF POC FEW (*)    All other components within normal limits  BASIC METABOLIC PANEL - Abnormal; Notable for the following:    Sodium 133 (*)    Glucose, Bld 336 (*)    All other components within normal limits  CBC WITH DIFFERENTIAL - Abnormal; Notable for the following:    WBC 10.7 (*)    All other components within normal limits  URINALYSIS, ROUTINE W REFLEX MICROSCOPIC - Abnormal; Notable for the following:    Glucose, UA >1000 (*)    Ketones, ur 15 (*)    All other components within normal limits  HEPATIC FUNCTION PANEL - Abnormal; Notable for the following:    Alkaline Phosphatase 157 (*)    Indirect Bilirubin 1.0 (*)    All other components within normal limits  CBG MONITORING, ED - Abnormal; Notable for the following:    Glucose-Capillary 261 (*)    All other components within normal limits  RAPID STREP SCREEN  CULTURE, GROUP A STREP  LIPASE, BLOOD  URINE MICROSCOPIC-ADD ON  CBG MONITORING, ED  GC/CHLAMYDIA PROBE AMP ([place])    Imaging Review Dg Chest 2 View  09/04/2014   CLINICAL DATA:  Nasal congestion, sore throat, bilateral ear pain for 4 days.  EXAM: CHEST  2 VIEW  COMPARISON:  04/24/2014  FINDINGS: The heart size  and mediastinal contours are within normal limits. Both lungs are clear. The visualized skeletal structures are unremarkable.  IMPRESSION: No active cardiopulmonary disease.   Electronically Signed   By: Rolm Baptise M.D.   On: 09/04/2014 20:52     EKG Interpretation None      MDM   Final diagnoses:  Hyperglycemia  Viral pharyngitis  Bacterial vaginosis    Patient feels better after Tylenol and IV fluids. She does have hyperglycemia without ketosis or acidosis. This is improved with fluids. She does have evidence of BV, given her vaginal itching and discharge will treat with Flagyl. No obvious etiology of her mild epigastric pain, but this is improved with Tylenol. Do not feel she is acute imaging based on benign exam. Otherwise appears to have viral upper respiratory infection, will treat symptomatically and recommended follow-up with her PCP.    Ephraim Hamburger, MD 09/05/14 [phone]

## 2014-09-05 MED ORDER — METRONIDAZOLE 500 MG PO TABS: 500.0000 mg | ORAL_TABLET | Freq: Two times a day (BID) | ORAL | Status: DC

## 2014-09-06 LAB — GC/CHLAMYDIA PROBE AMP (~~LOC~~) NOT AT ARMC
Chlamydia: NEGATIVE
Neisseria Gonorrhea: NEGATIVE

## 2014-09-07 LAB — CULTURE, GROUP A STREP

## 2014-09-27 ENCOUNTER — Encounter: Payer: Self-pay | Admitting: Internal Medicine

## 2014-09-27 ENCOUNTER — Ambulatory Visit: Payer: Self-pay | Attending: Internal Medicine | Admitting: Internal Medicine

## 2014-09-27 VITALS — BP 121/76 | HR 85 | Temp 98.0°F | Resp 16 | Wt 178.6 lb

## 2014-09-27 DIAGNOSIS — Z792 Long term (current) use of antibiotics: Secondary | ICD-10-CM | POA: Insufficient documentation

## 2014-09-27 DIAGNOSIS — Z7951 Long term (current) use of inhaled steroids: Secondary | ICD-10-CM | POA: Insufficient documentation

## 2014-09-27 DIAGNOSIS — E1165 Type 2 diabetes mellitus with hyperglycemia: Secondary | ICD-10-CM | POA: Insufficient documentation

## 2014-09-27 DIAGNOSIS — H538 Other visual disturbances: Secondary | ICD-10-CM | POA: Insufficient documentation

## 2014-09-27 DIAGNOSIS — J069 Acute upper respiratory infection, unspecified: Secondary | ICD-10-CM | POA: Insufficient documentation

## 2014-09-27 DIAGNOSIS — Z23 Encounter for immunization: Secondary | ICD-10-CM | POA: Insufficient documentation

## 2014-09-27 DIAGNOSIS — Z794 Long term (current) use of insulin: Secondary | ICD-10-CM | POA: Insufficient documentation

## 2014-09-27 DIAGNOSIS — R739 Hyperglycemia, unspecified: Secondary | ICD-10-CM

## 2014-09-27 DIAGNOSIS — E139 Other specified diabetes mellitus without complications: Secondary | ICD-10-CM

## 2014-09-27 LAB — POCT URINALYSIS DIPSTICK
Bilirubin, UA: NEGATIVE
GLUCOSE UA: 500
Leukocytes, UA: NEGATIVE
NITRITE UA: NEGATIVE
PH UA: 6
PROTEIN UA: NEGATIVE
Spec Grav, UA: 1.025
UROBILINOGEN UA: 0.2

## 2014-09-27 LAB — GLUCOSE, POCT (MANUAL RESULT ENTRY)
POC Glucose: 357 mg/dl — AB (ref 70–99)
POC Glucose: 231 mg/dL — AB (ref 70–99)

## 2014-09-27 LAB — POCT GLYCOSYLATED HEMOGLOBIN (HGB A1C): Hemoglobin A1C: 13.7

## 2014-09-27 MED ORDER — METFORMIN HCL 1000 MG PO TABS: 1000.0000 mg | ORAL_TABLET | Freq: Two times a day (BID) | ORAL | Status: DC

## 2014-09-27 MED ORDER — INSULIN GLARGINE 100 UNIT/ML SOLOSTAR PEN: 15.0000 [IU] | PEN_INJECTOR | Freq: Every day | SUBCUTANEOUS | Status: DC

## 2014-09-27 MED ORDER — INSULIN ASPART 100 UNIT/ML ~~LOC~~ SOLN
20.0000 [IU] | Freq: Once | SUBCUTANEOUS | Status: AC
  Administered 2014-09-27: 20 [IU] via SUBCUTANEOUS

## 2014-09-27 MED ORDER — AZITHROMYCIN 250 MG PO TABS: ORAL_TABLET | ORAL | Status: DC

## 2014-09-27 NOTE — Progress Notes (Signed)
Patient here with interpreter_[place] Complain of rash that is itchy around her neck Having some pain to her right ear Patient complains of having some blurred vision and increased thirst

## 2014-09-27 NOTE — Patient Instructions (Signed)
La diabetes mellitus y los alimentos (Diabetes Mellitus and Food) Es importante que controle su nivel de azcar en la sangre (glucosa). El nivel de glucosa en sangre depende en gran medida de lo que usted come. Comer alimentos saludables en las cantidades [place] a lo largo del Training and development officer, aproximadamente a la misma hora [employer], lo ayudar a Chief Technology Officer su nivel de Multimedia programmer. Tambin puede ayudarlo a retrasar o Patent attorney de la diabetes mellitus. Comer de [employer] saludable incluso puede ayudarlo a Chartered loss adjuster de presin arterial y a Science writer o Theatre manager un peso saludable.  CMO PUEDEN AFECTARME LOS ALIMENTOS? Carbohidratos Los carbohidratos afectan el nivel de glucosa en sangre ms que cualquier otro tipo de alimento. El nutricionista lo ayudar a Teacher, adult education cuntos carbohidratos puede consumir en cada comida y ensearle a contarlos. El recuento de carbohidratos es importante para mantener la glucosa en sangre en un nivel saludable, en especial si utiliza insulina o toma determinados medicamentos para la diabetes mellitus. Alcohol El alcohol puede provocar disminuciones sbitas de la glucosa en sangre (hipoglucemia), en especial si utiliza insulina o toma determinados medicamentos para la diabetes mellitus. La hipoglucemia es una afeccin que puede poner en peligro la vida. Los sntomas de la hipoglucemia (somnolencia, mareos y Data processing manager) son similares a los sntomas de haber consumido mucho alcohol.  Si el mdico lo autoriza a beber alcohol, hgalo con moderacin y siga estas pautas:  Las mujeres no deben beber ms de un trago por da, y los hombres no deben beber ms de dos tragos por Training and development officer. Un trago es igual a:  12 onzas (355 ml) de cerveza  5 onzas de vino (150 ml) de vino  1,5onzas (35ml) de bebidas espirituosas  No beba con el estmago vaco.  Mantngase hidratado. Beba agua, gaseosas dietticas o t helado sin azcar.  Las gaseosas comunes, los jugos y  otros refrescos podran contener muchos carbohidratos y se Civil Service fast streamer. QU ALIMENTOS NO SE RECOMIENDAN? Cuando haga las elecciones de alimentos, es importante que recuerde que todos los alimentos son distintos. Algunos tienen menos nutrientes que otros por porcin, aunque podran tener la misma cantidad de caloras o carbohidratos. Es difcil darle al cuerpo lo que necesita cuando consume alimentos con menos nutrientes. Estos son algunos ejemplos de alimentos que debera evitar ya que contienen muchas caloras y carbohidratos, pero pocos nutrientes:  Physicist, medical trans (la mayora de los alimentos procesados incluyen grasas trans en la etiqueta de Informacin nutricional).  Gaseosas comunes.  Jugos.  Caramelos.  Dulces, como tortas, pasteles, rosquillas y [place].  Comidas fritas. QU ALIMENTOS PUEDO COMER? Consuma alimentos ricos en nutrientes, que nutrirn el cuerpo y lo mantendrn saludable. Los alimentos que debe comer tambin dependern de varios factores, como:  Las caloras que necesita.  Los medicamentos que toma.  Su peso.  El nivel de glucosa en [place].  El [place] de presin arterial.  El nivel de colesterol. Tambin debe consumir una variedad de [place], como:  Protenas, como carne, aves, pescado, tofu, frutos secos y semillas (las protenas de [place] magros son mejores).  Lambert Mody.  Verduras.  Productos lcteos, como [place], queso y yogur (descremados son mejores).  Panes, granos, pastas, cereales, arroz y frijoles.  Grasas, como aceite de [place], [place] sin grasas trans, aceite de canola, aguacate y [place]. TODOS LOS QUE PADECEN DIABETES MELLITUS TIENEN EL [place] PLAN DE [place]? Dado que todas las personas que padecen diabetes mellitus son distintas, no hay un solo plan de comidas que funcione para todos. Es muy  importante que se rena con un nutricionista que lo ayudar a crear un plan de comidas adecuado para usted. Document Released: 11/13/2007  Document Revised: 08/11/2013 [facility] Patient Information 2015 [place]. This information is not intended to replace advice given to you by your health care provider. Make sure you discuss any questions you have with your health care provider.

## 2014-09-27 NOTE — Progress Notes (Signed)
MRN: [id number] Name: Margaret Austin  Sex: female Age: 59 y.o. DOB: [birth year]  Allergies: Review of patient's allergies indicates no known allergies.  Chief Complaint  Patient presents with  . Follow-up    HPI: Patient is 59 y.o. female who history of diabetes, as per patient she has been taking her metformin but her blood sugars recently have been high, she went to the emergency room last month with elevated blood sugar level, EMR reviewed patient was given IV fluids, she also was diagnosed with bacterial vaginosis and was treated with Flagyl, today her blood sugar is elevated, urine is positive for trace ketones, she is complaining of blurry vision and increased thirst, she's also complaining of URI symptoms stuffy nose ear pain sore throat, denies any fever chills.  Past Medical History  Diagnosis Date  . Diabetes mellitus without complication     History reviewed. No pertinent past surgical history.    Medication List       This list is accurate as of: 09/27/14  5:46 PM.  Always use your most recent med list.               azithromycin 250 MG tablet  Commonly known as:  ZITHROMAX Z-PAK  Take as directed     fluticasone 50 MCG/ACT nasal spray  Commonly known as:  FLONASE  Place 2 sprays into both nostrils daily.     Insulin Glargine 100 UNIT/ML Solostar Pen  Commonly known as:  LANTUS  Inject 15 Units into the skin daily at 10 pm.     metFORMIN 1000 MG tablet  Commonly known as:  GLUCOPHAGE  Take 1 tablet (1,000 mg total) by mouth 2 (two) times daily with a meal.     metroNIDAZOLE 500 MG tablet  Commonly known as:  FLAGYL  Take 1 tablet (500 mg total) by mouth 2 (two) times daily. One po bid x 7 days     mometasone 50 MCG/ACT nasal spray  Commonly known as:  NASONEX  Place 2 sprays into the nose daily.     oxyCODONE-acetaminophen 5-325 MG per tablet  Commonly known as:  PERCOCET/ROXICET  Take 1 tablet by mouth every 4 (four) hours as needed for  severe pain.     oxymetazoline 0.05 % nasal spray  Commonly known as:  AFRIN NASAL SPRAY  Place 1 spray into both nostrils 2 (two) times daily.     RA COL-RITE 100 MG capsule  Generic drug:  docusate sodium  Take 100 mg by mouth daily.     sulfamethoxazole-trimethoprim 800-160 MG per tablet  Commonly known as:  BACTRIM DS,SEPTRA DS  Take 1 tablet by mouth 2 (two) times daily.        Meds ordered this encounter  Medications  . insulin aspart (novoLOG) injection 20 Units    Sig:   . azithromycin (ZITHROMAX Z-PAK) 250 MG tablet    Sig: Take as directed    Dispense:  6 each    Refill:  0  . Insulin Glargine (LANTUS) 100 UNIT/ML Solostar Pen    Sig: Inject 15 Units into the skin daily at 10 pm.    Dispense:  15 mL    Refill:  3  . metFORMIN (GLUCOPHAGE) 1000 MG tablet    Sig: Take 1 tablet (1,000 mg total) by mouth 2 (two) times daily with a meal.    Dispense:  180 tablet    Refill:  3    Immunization History  Administered Date(s) Administered  .  Influenza,inj,Quad PF,36+ Mos 09/27/2014  . Pneumococcal Polysaccharide-23 09/27/2014    History reviewed. No pertinent family history.  History  Substance Use Topics  . Smoking status: Never Smoker   . Smokeless tobacco: Not on file  . Alcohol Use: No    Review of Systems   As noted in HPI  Filed Vitals:   09/27/14 1708  BP: 121/76  Pulse: 85  Temp: 98 F (36.7 C)  Resp: 16    Physical Exam  Physical Exam  HENT:  Nasal congestion sinus tenderness, bilateral TMs visualized, left congestion  Eyes: EOM are normal. Pupils are equal, round, and reactive to light.  Cardiovascular: Normal rate and regular rhythm.   Pulmonary/Chest: Breath sounds normal.  Musculoskeletal: She exhibits no edema.    CBC    Component Value Date/Time   WBC 10.7* 09/04/2014 2036   RBC 4.37 09/04/2014 2036   HGB 13.1 09/04/2014 2036   HCT 37.7 09/04/2014 2036   PLT 261 09/04/2014 2036   MCV 86.3 09/04/2014 2036   LYMPHSABS  4.0 09/04/2014 2036   MONOABS 0.7 09/04/2014 2036   EOSABS 0.1 09/04/2014 2036   BASOSABS 0.0 09/04/2014 2036    CMP     Component Value Date/Time   NA 133* 09/04/2014 2036   K 4.0 09/04/2014 2036   CL 98 09/04/2014 2036   CO2 28 09/04/2014 2036   GLUCOSE 336* 09/04/2014 2036   BUN 11 09/04/2014 2036   CREATININE 0.56 09/04/2014 2036   CREATININE 0.56 09/15/2013 1515   CALCIUM 8.7 09/04/2014 2036   PROT 7.0 09/04/2014 2036   ALBUMIN 3.8 09/04/2014 2036   AST 22 09/04/2014 2036   ALT 34 09/04/2014 2036   ALKPHOS 157* 09/04/2014 2036   BILITOT 1.1 09/04/2014 2036   GFRNONAA >90 09/04/2014 2036   GFRNONAA >89 09/15/2013 1515   GFRAA >90 09/04/2014 2036   GFRAA >89 09/15/2013 1515    Lab Results  Component Value Date/Time   CHOL 144 09/15/2013 03:15 PM    No components found for: HGA1C  Lab Results  Component Value Date/Time   AST 22 09/04/2014 08:36 PM    Assessment and Plan  Other specified diabetes mellitus without complications - Plan:   Results for orders placed or performed in visit on 09/27/14  Glucose (CBG)  Result Value Ref Range   POC Glucose 357 (A) 70 - 99 mg/dl  HgB A1c  Result Value Ref Range   Hemoglobin A1C 13.70   Urinalysis Dipstick  Result Value Ref Range   Color, UA yellow    Clarity, UA clear    Glucose, UA 500    Bilirubin, UA neg    Ketones, UA trace    Spec Grav, UA 1.025    Blood, UA trace    pH, UA 6.0    Protein, UA neg    Urobilinogen, UA 0.2    Nitrite, UA neg    Leukocytes, UA Negative    Diabetes is uncontrolled, her hemoglobin A1c has trended up,, Urinalysis Dipstick shows trace ketones, patient is treated with IV hydration and insulin, she'll continue with metformin I have started patient on Lantus 15 units each bedtime, monitor blood sugar at home, diabetes mellitus and he, patient will come back in 2 weeks for CBG check nurse Visit. , insulin aspart (novoLOG) injection 20 Units, metFORMIN (GLUCOPHAGE) 1000 MG tablet,  COMPLETE METABOLIC PANEL WITH GFR, Insert peripheral IV, Insert peripheral IV  Hyperglycemia IV hydration.  Blurry vision Likely secondary to hypercalcemia.  URI (upper  respiratory infection) I prescribed Z-Pak  Need for prophylactic vaccination against Streptococcus pneumoniae (pneumococcus) - Plan: Pneumococcal polysaccharide vaccine 23-valent greater than or equal to 2yo subcutaneous/IM   Health Maintenance  -Vaccinations:  Flu shot and Pneumovax given today.  Return in about 3 months (around 12/26/2014) for diabetes, CBG check in 2 weeks/Nurse Visit.  Lorayne Marek, MD

## 2014-09-27 NOTE — Progress Notes (Signed)
CBG 231 following 1000 mL NS infusion.  Patient states she is feeling better and vision is now less blurry. IV d/c. Catheter tip intact. Site dry without redness or edema.  Patient given BS log and instructed on usage Patient shown how to use insulin pen with correct return demonstration Patient educated on appropriate sites to inject insulin and importance of rotating sites Patient will schedule nurse visit for 2 weeks to review log  Patient discharged to home in stable condition.

## 2014-09-28 LAB — COMPLETE METABOLIC PANEL WITH GFR
ALK PHOS: 136 U/L — AB (ref 39–117)
ALT: 27 U/L (ref 0–35)
AST: 19 U/L (ref 0–37)
Albumin: 3.9 g/dL (ref 3.5–5.2)
BILIRUBIN TOTAL: 0.9 mg/dL (ref 0.2–1.2)
BUN: 10 mg/dL (ref 6–23)
CHLORIDE: 99 meq/L (ref 96–112)
CO2: 24 meq/L (ref 19–32)
CREATININE: 0.48 mg/dL — AB (ref 0.50–1.10)
Calcium: 8.8 mg/dL (ref 8.4–10.5)
GLUCOSE: 313 mg/dL — AB (ref 70–99)
Potassium: 3.5 mEq/L (ref 3.5–5.3)
SODIUM: 135 meq/L (ref 135–145)
Total Protein: 6.4 g/dL (ref 6.0–8.3)

## 2014-10-15 ENCOUNTER — Ambulatory Visit: Payer: Self-pay | Attending: Internal Medicine | Admitting: *Deleted

## 2014-10-15 VITALS — BP 111/65 | HR 66 | Temp 98.0°F | Resp 16 | Wt 174.8 lb

## 2014-10-15 DIAGNOSIS — E1365 Other specified diabetes mellitus with hyperglycemia: Secondary | ICD-10-CM | POA: Insufficient documentation

## 2014-10-15 DIAGNOSIS — E1165 Type 2 diabetes mellitus with hyperglycemia: Secondary | ICD-10-CM

## 2014-10-15 LAB — GLUCOSE, POCT (MANUAL RESULT ENTRY): POC Glucose: 211 mg/dl — AB (ref 70–99)

## 2014-10-15 MED ORDER — INSULIN PEN NEEDLE 29G X 12.7MM MISC: 15.0000 [IU] | Freq: Every day | Status: DC

## 2014-10-15 NOTE — Progress Notes (Addendum)
Spoke with patient via in-house interpreter, Laurene Footman and [place], [place], [place]  Patient presents for CBG and record review, however, patient did not bring log or meter Med list reviewed; patient reports taking metformin as directed, however, was not able to obtain pen needles at [facility] so has not started lantus insulin. PCP made aware. Patient reports AM fasting blood sugar yesterday was 175 Patient reports before dinner blood sugar yesterday was 200 Patient reports 3 hour after dinner blood sugar yesterday was 277 Patient has not checked BS today and does not remember any other readings  CBG 2113 hours after beginning meal  BP 111/65 P 66 R 16 T 98.0 oral SpO2 97%  Pen needles e-scribed to [facility] Patient re-educated on how to use insulin pen with correct return demonstration  Patient will begin lantus 15 units qHS tonight Continue metformin bid Check BS and record AM fasting and before dinner Return in 2 weeks for nurse visit for CBG and record review  Patient advised to call for med refills at least 7 days before running out so as not to go without.

## 2014-11-08 ENCOUNTER — Ambulatory Visit: Payer: Self-pay | Attending: Internal Medicine

## 2014-11-26 ENCOUNTER — Ambulatory Visit: Payer: Self-pay | Attending: Internal Medicine | Admitting: Physician Assistant

## 2014-11-26 VITALS — BP 104/67 | HR 71 | Temp 97.9°F | Resp 16

## 2014-11-26 DIAGNOSIS — J302 Other seasonal allergic rhinitis: Secondary | ICD-10-CM

## 2014-11-26 MED ORDER — CETIRIZINE HCL 10 MG PO TABS: 10.0000 mg | ORAL_TABLET | Freq: Every day | ORAL | Status: DC

## 2014-11-26 NOTE — Progress Notes (Signed)
Chief Complaint: Right ear pain  Subjective: This is a 59 year old Hispanic female is here for routine nurse visit and explained to the nurse she had been having some right ear pain. She states that she's been having a lot of right ear and right face pain for the last month. She's been doing some over-the-counter drops to the right ear and some saline nasal spray with temporary relief. Her symptoms have worsened over the last week. Occasionally she has headaches especially in the forehead area. She also has runny nose and increased sneezing. She admits to allergies. Her appetite has been fine. No coughing. No chest pain or shortness of breath. No fevers or chills. She has been given nasal spray such as Flonase in the past and states that they were too expensive to have field.  ROS:  GEN: denies fever or chills, denies change in weight Skin: denies lesions or rashes HEENT: admits headache, R earache, no epistaxis, sore throat, or neck pain LUNGS: denies SHOB, dyspnea, PND, orthopnea    Objective:  Filed Vitals:   11/26/14 1500  BP: 104/67  Pulse: 71  Temp: 97.9 F (36.6 C)  TempSrc: Oral  Resp: 16  SpO2: 99%    Physical Exam:  General: in no acute distress. HEENT: no pallor, no icterus, moist oral mucosa slightly injected, no exudates, no JVD, no lymphadenopathy; injected left ear>right Heart: Normal  s1 &s2  Regular rate and rhythm, without murmurs, rubs, gallops. Lungs: Clear to auscultation bilaterally.    Medications: Prior to Admission medications   Medication Sig Start Date End Date Taking? Authorizing Provider  metFORMIN (GLUCOPHAGE) 1000 MG tablet Take 1 tablet (1,000 mg total) by mouth 2 (two) times daily with a meal. 09/27/14  Yes Deepak Advani, MD  azithromycin (ZITHROMAX Z-PAK) 250 MG tablet Take as directed Patient not taking: Reported on 11/26/2014 09/27/14   Lorayne Marek, MD  cetirizine (ZYRTEC) 10 MG tablet Take 1 tablet (10 mg total) by mouth daily. 11/26/14    Tiffany Daneil Dan, PA-C  docusate sodium (RA COL-RITE) 100 MG capsule Take 100 mg by mouth daily.    Historical Provider, MD  fluticasone (FLONASE) 50 MCG/ACT nasal spray Place 2 sprays into both nostrils daily. Patient not taking: Reported on 11/26/2014 06/29/14   Lorayne Marek, MD  Insulin Glargine (LANTUS) 100 UNIT/ML Solostar Pen Inject 15 Units into the skin daily at 10 pm. Patient not taking: Reported on 10/15/2014 09/27/14   Lorayne Marek, MD  Insulin Pen Needle (ULTICARE PEN NEEDLES) 29G X 12.7MM MISC 15 Units by Does not apply route at bedtime. Patient not taking: Reported on 11/26/2014 10/15/14   Lorayne Marek, MD  metroNIDAZOLE (FLAGYL) 500 MG tablet Take 1 tablet (500 mg total) by mouth 2 (two) times daily. One po bid x 7 days Patient not taking: Reported on 11/26/2014 09/05/14   Sherwood Gambler, MD  mometasone (NASONEX) 50 MCG/ACT nasal spray Place 2 sprays into the nose daily. Patient not taking: Reported on 11/26/2014 04/24/14   Julianne Rice, MD  oxyCODONE-acetaminophen (PERCOCET/ROXICET) 5-325 MG per tablet Take 1 tablet by mouth every 4 (four) hours as needed for severe pain. Patient not taking: Reported on 11/26/2014 04/17/14   Virgel Manifold, MD  oxymetazoline [facility] NASAL SPRAY) 0.05 % nasal spray Place 1 spray into both nostrils 2 (two) times daily. Patient not taking: Reported on 11/26/2014 04/24/14   Julianne Rice, MD  sulfamethoxazole-trimethoprim (BACTRIM DS,SEPTRA DS) 800-160 MG per tablet Take 1 tablet by mouth 2 (two) times daily. Patient not taking:  Reported on 11/26/2014 06/29/14   Lorayne Marek, MD    Assessment: 1. Seasonal Allergies  Plan: Continue saline nasal spray Start Zyrtec once daily Return to clinic if no symptom improvement over the next 3-5 days.  Follow up:as scheduled with Dr. Annitta Needs  The patient was given clear instructions to go to ER or return to medical center if symptoms don't improve, worsen or new problems develop. The patient verbalized understanding. The  patient was told to call to get lab results if they haven't heard anything in the next week.   This note has been created with Surveyor, quantity. Any transcriptional errors are unintentional.   Zettie Pho, PA-C 11/26/2014, 3:18 PM

## 2014-11-26 NOTE — Progress Notes (Signed)
Patient presents for CBG and record review for T2DM Patient did not record blood sugars over last 5 days Med list reviewed; patient reports taking metformin as directed. Stopped lantus insulin 1 week ago due to 1 reading of 39 Patient has not recorded blood sugars for last 5 days Patient's AM fasting blood sugars ranging  Patient's before dinner blood sugars ranging  C/o 2 week hx right ear pain; rates 5/10 at present but states it has been as high as 10/10, nasal congestion and white/yellow nasal drainage  CBG 121 2 hours after lunch  BP 104/67 P 71 R 16 T  97.9 oral SpO2 99%  Patient will restart lantus insulin   Patient to return in 2 weeks for nurse visit for CBG and record review

## 2014-12-16 ENCOUNTER — Ambulatory Visit: Payer: Self-pay | Attending: Internal Medicine | Admitting: *Deleted

## 2014-12-16 VITALS — BP 97/60 | HR 67 | Temp 97.7°F | Resp 16

## 2014-12-16 DIAGNOSIS — R42 Dizziness and giddiness: Secondary | ICD-10-CM | POA: Insufficient documentation

## 2014-12-16 DIAGNOSIS — M79605 Pain in left leg: Secondary | ICD-10-CM | POA: Insufficient documentation

## 2014-12-16 DIAGNOSIS — E1165 Type 2 diabetes mellitus with hyperglycemia: Secondary | ICD-10-CM | POA: Insufficient documentation

## 2014-12-16 DIAGNOSIS — G8929 Other chronic pain: Secondary | ICD-10-CM | POA: Insufficient documentation

## 2014-12-16 NOTE — Progress Notes (Addendum)
Spoke with patient via IT sales professional, Restaurant manager, fast food Patient presents for CBG and record review for T2DM Med list reviewed; patient reports taking metformin 1000 mg bid and lantus15 units qHS as directed Patient's AM fasting blood sugars ranging 95-125 Patient's before lunch blood sugars ranging 85-120 Patient's before dinner blood sugars ranging 80-123 States had vomiting and diarrhea 4 days ago lasting 1 day and has felt lightheaded since Discussed transitioning slowly from lying/reclining to sitting before standing to prevent lightheadedness C/o chronic left leg pain following work-related fx 04/2014; rates 8/10 at present Was not able to work yesterday due to pain; works on feet 12 hours per day Requesting med for pain  Filed Vitals:   12/16/14 1651  BP: 97/60  Pulse: 67  Temp: 97.7 F (36.5 C)  Resp: 16    Lab Results  Component Value Date   HGBA1C 13.70 09/27/2014   Will send note to PCP regarding pain med and call patient back  Patient advised to call for med refills at least 7 days before running out so as not to go without.  Patient aware that she is to f/u with PCP 3 months from last visit. Due 12/26/2014

## 2014-12-17 ENCOUNTER — Telehealth: Payer: Self-pay | Admitting: *Deleted

## 2014-12-17 MED ORDER — NAPROXEN 500 MG PO TABS: 500.0000 mg | ORAL_TABLET | Freq: Two times a day (BID) | ORAL | Status: DC

## 2014-12-17 NOTE — Telephone Encounter (Signed)
Attempted to reach patient via Margaret Austin, [place] Message left for patient to return call Per PCP Rx for naproxen 500 mg bid with meals e-scribed to [facility] pharmacy for left leg pain

## 2014-12-21 NOTE — Telephone Encounter (Signed)
Patient notified of new medication for left leg pain via in-house interpreter, [place] Patient made aware that this med should be taken with food

## 2015-01-03 ENCOUNTER — Ambulatory Visit: Payer: Self-pay | Attending: Internal Medicine | Admitting: Internal Medicine

## 2015-01-03 ENCOUNTER — Encounter: Payer: Self-pay | Admitting: Internal Medicine

## 2015-01-03 VITALS — BP 113/76 | HR 75 | Temp 98.0°F | Resp 16 | Wt 178.0 lb

## 2015-01-03 DIAGNOSIS — J320 Chronic maxillary sinusitis: Secondary | ICD-10-CM | POA: Insufficient documentation

## 2015-01-03 DIAGNOSIS — Z791 Long term (current) use of non-steroidal anti-inflammatories (NSAID): Secondary | ICD-10-CM | POA: Insufficient documentation

## 2015-01-03 DIAGNOSIS — R0981 Nasal congestion: Secondary | ICD-10-CM

## 2015-01-03 DIAGNOSIS — Z7951 Long term (current) use of inhaled steroids: Secondary | ICD-10-CM | POA: Insufficient documentation

## 2015-01-03 DIAGNOSIS — E119 Type 2 diabetes mellitus without complications: Secondary | ICD-10-CM | POA: Insufficient documentation

## 2015-01-03 DIAGNOSIS — Z794 Long term (current) use of insulin: Secondary | ICD-10-CM | POA: Insufficient documentation

## 2015-01-03 DIAGNOSIS — E139 Other specified diabetes mellitus without complications: Secondary | ICD-10-CM

## 2015-01-03 DIAGNOSIS — M79672 Pain in left foot: Secondary | ICD-10-CM | POA: Insufficient documentation

## 2015-01-03 LAB — GLUCOSE, POCT (MANUAL RESULT ENTRY): POC Glucose: 145 mg/dl — AB (ref 70–99)

## 2015-01-03 LAB — POCT GLYCOSYLATED HEMOGLOBIN (HGB A1C): HEMOGLOBIN A1C: 7.2

## 2015-01-03 MED ORDER — AZITHROMYCIN 250 MG PO TABS: ORAL_TABLET | ORAL | Status: DC

## 2015-01-03 MED ORDER — TRUEPLUS LANCETS 28G MISC: Status: DC

## 2015-01-03 MED ORDER — GLUCOSE BLOOD VI STRP: ORAL_STRIP | Status: DC

## 2015-01-03 MED ORDER — FLUTICASONE PROPIONATE 50 MCG/ACT NA SUSP: 2.0000 | Freq: Every day | NASAL | Status: DC

## 2015-01-03 NOTE — Patient Instructions (Addendum)
La diabetes mellitus y los alimentos (Diabetes Mellitus and Food) Es importante que controle su nivel de azcar en la sangre (glucosa). El nivel de glucosa en sangre depende en gran medida de lo que usted come. Comer alimentos saludables en las cantidades [place] a lo largo del Training and development officer, aproximadamente a la misma hora [employer], lo ayudar a Chief Technology Officer su nivel de Multimedia programmer. Tambin puede ayudarlo a retrasar o Patent attorney de la diabetes mellitus. Comer de [employer] saludable incluso puede ayudarlo a Chartered loss adjuster de presin arterial y a Science writer o Theatre manager un peso saludable.  CMO PUEDEN AFECTARME LOS ALIMENTOS? Carbohidratos Los carbohidratos afectan el nivel de glucosa en sangre ms que cualquier otro tipo de alimento. El nutricionista lo ayudar a Teacher, adult education cuntos carbohidratos puede consumir en cada comida y ensearle a contarlos. El recuento de carbohidratos es importante para mantener la glucosa en sangre en un nivel saludable, en especial si utiliza insulina o toma determinados medicamentos para la diabetes mellitus. Alcohol El alcohol puede provocar disminuciones sbitas de la glucosa en sangre (hipoglucemia), en especial si utiliza insulina o toma determinados medicamentos para la diabetes mellitus. La hipoglucemia es una afeccin que puede poner en peligro la vida. Los sntomas de la hipoglucemia (somnolencia, mareos y Data processing manager) son similares a los sntomas de haber consumido mucho alcohol.  Si el mdico lo autoriza a beber alcohol, hgalo con moderacin y siga estas pautas:  Las mujeres no deben beber ms de un trago por da, y los hombres no deben beber ms de dos tragos por Training and development officer. Un trago es igual a:  12 onzas (355 ml) de cerveza  5 onzas de vino (150 ml) de vino  1,5onzas (35ml) de bebidas espirituosas  No beba con el estmago vaco.  Mantngase hidratado. Beba agua, gaseosas dietticas o t helado sin azcar.  Las gaseosas comunes, los jugos y  otros refrescos podran contener muchos carbohidratos y se Civil Service fast streamer. QU ALIMENTOS NO SE RECOMIENDAN? Cuando haga las elecciones de alimentos, es importante que recuerde que todos los alimentos son distintos. Algunos tienen menos nutrientes que otros por porcin, aunque podran tener la misma cantidad de caloras o carbohidratos. Es difcil darle al cuerpo lo que necesita cuando consume alimentos con menos nutrientes. Estos son algunos ejemplos de alimentos que debera evitar ya que contienen muchas caloras y carbohidratos, pero pocos nutrientes:  Physicist, medical trans (la mayora de los alimentos procesados incluyen grasas trans en la etiqueta de Informacin nutricional).  Gaseosas comunes.  Jugos.  Caramelos.  Dulces, como tortas, pasteles, rosquillas y [place].  Comidas fritas. QU ALIMENTOS PUEDO COMER? Consuma alimentos ricos en nutrientes, que nutrirn el cuerpo y lo mantendrn saludable. Los alimentos que debe comer tambin dependern de varios factores, como:  Las caloras que necesita.  Los medicamentos que toma.  Su peso.  El nivel de glucosa en [place].  El [place] de presin arterial.  El nivel de colesterol. Tambin debe consumir una variedad de [place], como:  Protenas, como carne, aves, pescado, tofu, frutos secos y semillas (las protenas de [place] magros son mejores).  Lambert Mody.  Verduras.  Productos lcteos, como [place]off, queso y yogur (descremados son mejores).  Panes, granos, pastas, cereales, arroz y frijoles.  Grasas, como aceite de [place], [place] sin grasas trans, aceite de canola, aguacate y [place]. TODOS LOS QUE PADECEN DIABETES MELLITUS TIENEN EL [place] PLAN DE [place]? Dado que todas las personas que padecen diabetes mellitus son distintas, no hay un solo plan de comidas que funcione para todos. Es muy  importante que se rena con un nutricionista que lo ayudar a crear un plan de comidas adecuado para usted. Document Released: 11/13/2007  Document Revised: 08/11/2013 [facility] Patient Information 2015 [place]. This information is not intended to replace advice given to you by your health care provider. Make sure you discuss any questions you have with your health care provider. Fascitis plantar (Plantar Fascitis) La fascitis plantar es un trastorno frecuente que ocasiona dolor en el pie. Se trata de una inflamacin (irritacin) de la banda de tejidos fibrosos y resistentes que se extienden desde el hueso del taln (calcneo) hasta la parte anterior de la planta del pie. Esta inflamacin puede deberse a que ha permanecido Kellogg, a zapatos que no Archivist, a correr [employer] Kroger, al sobrepeso, a un andar anormal y el uso excesivo del pie con dolor (esto es frecuente en los corredores). Tambin es frecuente [employer] que practican ejercicios aerbicos y [place] bailarines de ballet. SNTOMAS La persona que sufre fascitis plantar manifiesta:  Dolor intenso por la Lockheed Martin parte inferior del pie, especialmente al dar los primeros pasos luego de levantarse de la cama. Este dolor disminuye luego de caminar algunos minutos.  Dolor intenso al caminar [employer] de un tiempo prolongado de inactividad.  El dolor empeora al caminar descalzo o al subir escaleras. DIAGNSTICO  El mdico har el diagnstico examinando sus pies.  En general no es necesario indicar radiografas. PREVENCIN  Consulte a Insurance claims handler en medicina del deporte antes de comenzar un nuevo programa de ejercicios.  Los programas de caminatas ofrecen un buen entrenamiento. Hay una menor probabilidad de sufrir lesiones por uso excesivo, que son frecuentes [employer] personas que corren. Hay menos impacto y menos agresin a las articulaciones.  Comience lentamente todo nuevo programa de ejercicios. Si aparece algn problema o dolor, disminuya la cantidad de tiempo o la distancia hasta que se encuentre cmodo.  Use  calzado de buena calidad y reemplcelo regularmente.  Estire el pie y los ligamentos que se encuentran en la parte posterior del tobillo (tendn de [place]) antes y despus de Optometrist actividad fsica.  Corra o practique ejercicios sobre superficies parejas que no sean duras. Por ejemplo, el asfalto es mejor que el pavimento.  No corra descalzo sobre superficies duras.  Si camina sobre cinta, vare la inclinacin.  No siga con el entrenamiento si tiene problemas en el pie o en la articulacin. Busque ayuda profesional si no mejora. INSTRUCCIONES PARA EL CUIDADO DOMICILIARIO  Evite las [employer] causan dolor hasta que se recupere.  Use hielo o compresas fras sobre las zonas doloridas despus de Optometrist ejercicios.  Only take over-the-counter or prescription medicines for pain, discomfort, or fever as directed by your caregiver.  [employer] zapatillas con base de aire o gel pueden ser de [place].  Si los problemas continan o se agravan, consulte a Teaching laboratory technician en medicina del deporte o con su mdico personal. La cortisona es un potente antiinflamatorio que puede inyectarse en la zona dolorida. El profesional que lo asiste comentar este tratamiento con usted. EST SEGURO QUE:   Comprende las instrucciones para el alta mdica.  Controlar su enfermedad.  Solicitar atencin mdica de inmediato segn las indicaciones. Document Released: 05/16/2005 Document Revised: 10/29/2011 [facility] Patient Information 2015 [place]. This information is not intended to replace advice given to you by your health care provider. Make sure you discuss any questions you have with your health care  provider.  

## 2015-01-03 NOTE — Progress Notes (Signed)
Patient complains of pain to her heel on her left foot that started a few days ago Patient also complains or right ear pain, congestion thick green mucous and cough

## 2015-01-03 NOTE — Progress Notes (Signed)
MRN: [id number] Name: Margaret Austin  Sex: female Age: 59 y.o. DOB: 04/12/1956  Allergies: Review of patient's allergies indicates no known allergies.  Chief Complaint  Patient presents with  . Foot Pain    left    HPI: Patient is 59 y.o. female who has history of diabetes currently on metformin and insulin, comes today for followup denies any hypoglycemic symptoms, her hemoglobin A1c has trended down, today she is complaining of stuffy nose postnasal drip ear ache coughing for the last few weeks denies any fever chills  Chest pain or shortness of breath, she also complaining of left foot heel pain on and off for a few weeks the pain is worse with the first step in the morning which gradually gets better, she's requesting refill on naproxen.   Past Medical History  Diagnosis Date  . Diabetes mellitus without complication     History reviewed. No pertinent past surgical history.    Medication List       This list is accurate as of: 01/03/15 12:30 PM.  Always use your most recent med list.               azithromycin 250 MG tablet  Commonly known as:  ZITHROMAX Z-PAK  Take as directed     cetirizine 10 MG tablet  Commonly known as:  ZYRTEC  Take 1 tablet (10 mg total) by mouth daily.     fluticasone 50 MCG/ACT nasal spray  Commonly known as:  FLONASE  Place 2 sprays into both nostrils daily.     glucose blood test strip  Commonly known as:  TRUETEST TEST  Check blood sugar TID & QHS     Insulin Glargine 100 UNIT/ML Solostar Pen  Commonly known as:  LANTUS  Inject 15 Units into the skin daily at 10 pm.     Insulin Pen Needle 29G X 12.7MM Misc  Commonly known as:  ULTICARE PEN NEEDLES  15 Units by Does not apply route at bedtime.     metFORMIN 1000 MG tablet  Commonly known as:  GLUCOPHAGE  Take 1 tablet (1,000 mg total) by mouth 2 (two) times daily with a meal.     mometasone 50 MCG/ACT nasal spray  Commonly known as:  NASONEX  Place 2 sprays into the  nose daily.     naproxen 500 MG tablet  Commonly known as:  NAPROSYN  Take 1 tablet (500 mg total) by mouth 2 (two) times daily with a meal.     oxyCODONE-acetaminophen 5-325 MG per tablet  Commonly known as:  PERCOCET/ROXICET  Take 1 tablet by mouth every 4 (four) hours as needed for severe pain.     oxymetazoline 0.05 % nasal spray  Commonly known as:  AFRIN NASAL SPRAY  Place 1 spray into both nostrils 2 (two) times daily.     RA COL-RITE 100 MG capsule  Generic drug:  docusate sodium  Take 100 mg by mouth daily.     TRUEPLUS LANCETS 28G Misc  Check blood sugar TID & QHS        Meds ordered this encounter  Medications  . glucose blood (TRUETEST TEST) test strip    Sig: Check blood sugar TID & QHS    Dispense:  100 each    Refill:  12  . TRUEPLUS LANCETS 28G MISC    Sig: Check blood sugar TID & QHS    Dispense:  100 each    Refill:  2  . azithromycin (ZITHROMAX Z-PAK)  250 MG tablet    Sig: Take as directed    Dispense:  6 each    Refill:  0  . fluticasone (FLONASE) 50 MCG/ACT nasal spray    Sig: Place 2 sprays into both nostrils daily.    Dispense:  16 g    Refill:  3    Immunization History  Administered Date(s) Administered  . Influenza,inj,Quad PF,36+ Mos 09/27/2014  . Pneumococcal Polysaccharide-23 09/27/2014    History reviewed. No pertinent family history.  History  Substance Use Topics  . Smoking status: Never Smoker   . Smokeless tobacco: Not on file  . Alcohol Use: No    Review of Systems   As noted in HPI  Filed Vitals:   01/03/15 1203  BP: 113/76  Pulse: 75  Temp: 98 F (36.7 C)  Resp: 16    Physical Exam  Physical Exam  Constitutional: No distress.  HENT:   Sinus tenderness, nasal congestion, no pharyngeal erythema no exudate, both TMs visualized not congested  Eyes: EOM are normal. Pupils are equal, round, and reactive to light.  Cardiovascular: Normal rate and regular rhythm.   Pulmonary/Chest: Breath sounds normal. No  respiratory distress. She has no wheezes. She has no rales.  Musculoskeletal:  Left foot heel tenderness    CBC    Component Value Date/Time   WBC 10.7* 09/04/2014 2036   RBC 4.37 09/04/2014 2036   HGB 13.1 09/04/2014 2036   HCT 37.7 09/04/2014 2036   PLT 261 09/04/2014 2036   MCV 86.3 09/04/2014 2036   LYMPHSABS 4.0 09/04/2014 2036   MONOABS 0.7 09/04/2014 2036   EOSABS 0.1 09/04/2014 2036   BASOSABS 0.0 09/04/2014 2036    CMP     Component Value Date/Time   NA 135 09/27/2014 1740   K 3.5 09/27/2014 1740   CL 99 09/27/2014 1740   CO2 24 09/27/2014 1740   GLUCOSE 313* 09/27/2014 1740   BUN 10 09/27/2014 1740   CREATININE 0.48* 09/27/2014 1740   CREATININE 0.56 09/04/2014 2036   CALCIUM 8.8 09/27/2014 1740   PROT 6.4 09/27/2014 1740   ALBUMIN 3.9 09/27/2014 1740   AST 19 09/27/2014 1740   ALT 27 09/27/2014 1740   ALKPHOS 136* 09/27/2014 1740   BILITOT 0.9 09/27/2014 1740   GFRNONAA >89 09/27/2014 1740   GFRNONAA >90 09/04/2014 2036   GFRAA >89 09/27/2014 1740   GFRAA >90 09/04/2014 2036    Lab Results  Component Value Date/Time   CHOL 144 09/15/2013 03:15 PM    Lab Results  Component Value Date/Time   HGBA1C 7.20 01/03/2015 12:04 PM    Lab Results  Component Value Date/Time   AST 19 09/27/2014 05:40 PM    Assessment and Plan  Other specified diabetes mellitus without complications - Plan:  Results for orders placed or performed in visit on 01/03/15  Glucose (CBG)  Result Value Ref Range   POC Glucose 145.0 (A) 70 - 99 mg/dl  HgB A1c  Result Value Ref Range   Hemoglobin A1C 7.20    Hemoglobin A1c has trended down, continue with diabetes meal planning continue with metformin and insulin  Ambulatory referral to Podiatry, COMPLETE METABOLIC PANEL WITH GFR  Heel pain, left - Plan:likely plantar fasciitis, patient is given handout for exercises, advise for comfortable shoes, also naproxen as needed Ambulatory referral to Podiatry  Maxillary  sinusitis, unspecified chronicity - Plan: azithromycin (ZITHROMAX Z-PAK) 250 MG tablet  Nasal congestion - Plan: fluticasone (FLONASE) 50 MCG/ACT nasal spray   Return  in about 3 months (around 04/05/2015) for diabetes.   This note has been created with Surveyor, quantity. Any transcriptional errors are unintentional.    Lorayne Marek, MD

## 2015-01-31 ENCOUNTER — Ambulatory Visit: Payer: No Typology Code available for payment source

## 2015-01-31 ENCOUNTER — Encounter: Payer: Self-pay | Admitting: Podiatry

## 2015-01-31 ENCOUNTER — Ambulatory Visit (INDEPENDENT_AMBULATORY_CARE_PROVIDER_SITE_OTHER): Payer: No Typology Code available for payment source | Admitting: Podiatry

## 2015-01-31 DIAGNOSIS — E1140 Type 2 diabetes mellitus with diabetic neuropathy, unspecified: Secondary | ICD-10-CM

## 2015-01-31 DIAGNOSIS — E1149 Type 2 diabetes mellitus with other diabetic neurological complication: Secondary | ICD-10-CM

## 2015-01-31 DIAGNOSIS — R52 Pain, unspecified: Secondary | ICD-10-CM

## 2015-01-31 DIAGNOSIS — M722 Plantar fascial fibromatosis: Secondary | ICD-10-CM

## 2015-01-31 MED ORDER — MELOXICAM 7.5 MG PO TABS: 7.5000 mg | ORAL_TABLET | Freq: Every day | ORAL | Status: DC

## 2015-01-31 NOTE — Progress Notes (Addendum)
   Subjective:    Patient ID: Margaret Austin, female    DOB: 11-19-1955, 59 y.o.   MRN: [id number]  HPI Pt stated lt bottom of the heel is painful for 15-20 days. The foot is getting worse especially when walking for period of time after working for elongated time. She has pain after working all day. She denies any recent injury or trauma. She does that she injured her left hip in September 2015 and is thinking the pain in her foot started from that injury. She denies any numbness or tingling to her feet. Denies any swelling or redness. No recent change in activity, or shoe changes. No other complaints at this time. Last HbA1c 13.7  Review of Systems  All other systems reviewed and are negative.      Objective:   Physical Exam AAO x3, NAD DP/PT pulses palpable bilaterally, CRT less than 3 seconds Protective sensation decreased with Simms Weinstein monofilament, vibratory sensation decreased, Achilles tendon reflex intact; negative tinel sign  Tenderness to palpation overlying the plantar medial tubercle of the calcaneus to left heel at the insertion of the plantar fascia. There is no pain along the course of plantar fascial within the arch of the foot. There is no pain with lateral compression of the calcaneus or pain the vibratory sensation. No pain on the posterior aspect of the calcaneus or along the course/insertion of the Achilles tendon. There is no overlying edema, erythema, increase in warmth. No other areas of tenderness palpation or pain with vibratory sensation to the foot/ankle. MMT 5/5, ROM WNL No open lesions or pre-ulcerative lesions are identified. No pain with calf compression, swelling, warmth, erythema.     Assessment & Plan:   59 year old female left heel pain, possible plantar fasciitis; neuropathy -X-rays were obtained and reviewed with the patient.  -Treatment options discussed including all alternatives, risks, and complications -Discussed steroid injection  however given her high BS will hold off at this time -Plantar fascia brace dispensed -Ice to the area -Stretching exercises on a consistent basis. -Discussed shoe gear modifications not to go barefoot even while at home. -Prescribed meloxicam. Discussed side effects the medication digit. If any are to occur and call the office. -Follow-up in 4 weeks or sooner if any problems are to arise. In the meantime I encouraged her to call the office with any questions, concerns, changes symptoms. -Follow-up with PCP in regards to neuropathy. I discussed various treatment options.

## 2015-01-31 NOTE — Patient Instructions (Signed)
Diabetic Neuropathy Diabetic neuropathy is a nerve disease or nerve damage that is caused by diabetes mellitus. About half of all people with diabetes mellitus have some form of nerve damage. Nerve damage is more common in those who have had diabetes mellitus for many years and who generally have not had good control of their blood sugar (glucose) level. Diabetic neuropathy is a common complication of diabetes mellitus. There are three more common types of diabetic neuropathy and a fourth type that is less common and less understood:   Peripheral neuropathy--This is the most common type of diabetic neuropathy. It causes damage to the nerves of the feet and legs first and then eventually the hands and arms.The damage affects the ability to sense touch.  Autonomic neuropathy--This type causes damage to the autonomic nervous system, which controls the following functions:  Heartbeat.  Body temperature.  Blood pressure.  Urination.  Digestion.  Sweating.  Sexual function.  Focal neuropathy--Focal neuropathy can be painful and unpredictable and occurs most often in older adults with diabetes mellitus. It involves a specific nerve or one area and often comes on suddenly. It usually does not cause long-term problems.  Radiculoplexus neuropathy-- Sometimes called lumbosacral radiculoplexus neuropathy, radiculoplexus neuropathy affects the nerves of the thighs, hips, buttocks, or legs. It is more common in people with type 2 diabetes mellitus and in older men. It is characterized by debilitating pain, weakness, and atrophy, usually in the thigh muscles. CAUSES  The cause of peripheral, autonomic, and focal neuropathies is diabetes mellitus that is uncontrolled and high glucose levels. The cause of radiculoplexus neuropathy is unknown. However, it is thought to be caused by inflammation related to uncontrolled glucose levels. SIGNS AND SYMPTOMS  Peripheral Neuropathy Peripheral neuropathy develops  slowly over time. When the nerves of the feet and legs no longer work there may be:   Burning, stabbing, or aching pain in the legs or feet.  Inability to feel pressure or pain in your feet. This can lead to:  Thick calluses over pressure areas.  Pressure sores.  Ulcers.  Foot deformities.  Reduced ability to feel temperature changes.  Muscle weakness. Autonomic Neuropathy The symptoms of autonomic neuropathy vary depending on which nerves are affected. Symptoms may include:  Problems with digestion, such as:  Feeling sick to your stomach (nausea).  Vomiting.  Bloating.  Constipation.  Diarrhea.  Abdominal pain.  Difficulty with urination. This occurs if you lose your ability to sense when your bladder is full. Problems include:  Urine leakage (incontinence).  Inability to empty your bladder completely (retention).  Rapid or irregular heartbeat (palpitations).  Blood pressure drops when you stand up (orthostatic hypotension). When you stand up you may feel:  Dizzy.  Weak.  Faint.  In men, inability to attain and maintain an erection.  In women, vaginal dryness and problems with decreased sexual desire and arousal.  Problems with body temperature regulation.  Increased or decreased sweating. Focal Neuropathy  Abnormal eye movements or abnormal alignment of both eyes.  Weakness in the wrist.  Foot drop. This results in an inability to lift the foot properly and abnormal walking or foot movement.  Paralysis on one side of your face (Bell palsy).  Chest or abdominal pain. Radiculoplexus Neuropathy  Sudden, severe pain in your hip, thigh, or buttocks.  Weakness and wasting of thigh muscles.  Difficulty rising from a seated position.  Abdominal swelling.  Unexplained weight loss (usually more than 10 lb [4.5 kg]). DIAGNOSIS  Peripheral Neuropathy Your senses may   be tested. Sensory function testing can be done with:  A light touch using a  monofilament.  A vibration with tuning fork.  A sharp sensation with a pin prick. Other tests that can help diagnose neuropathy are:  Nerve conduction velocity. This test checks the transmission of an electrical current through a nerve.  Electromyography. This shows how muscles respond to electrical signals transmitted by nearby nerves.  Quantitative sensory testing. This is used to assess how your nerves respond to vibrations and changes in temperature. Autonomic Neuropathy Diagnosis is often based on reported symptoms. Tell your health care provider if you experience:   Dizziness.   Constipation.   Diarrhea.   Inappropriate urination or inability to urinate.   Inability to get or maintain an erection.  Tests that may be done include:   Electrocardiography or Holter monitor. These are tests that can help show problems with the heart rate or heart rhythm.   An X-ray exam may be done. Focal Neuropathy Diagnosis is made based on your symptoms and what your health care provider finds during your exam. Other tests may be done. They may include:  Nerve conduction velocities. This checks the transmission of electrical current through a nerve.  Electromyography. This shows how muscles respond to electrical signals transmitted by nearby nerves.  Quantitative sensory testing. This test is used to assess how your nerves respond to vibration and changes in temperature. Radiculoplexus Neuropathy  Often the first thing is to eliminate any other issue or problems that might be the cause, as there is no stick test for diagnosis.  X-ray exam of your spine and lumbar region.  Spinal tap to rule out cancer.  MRI to rule out other lesions. TREATMENT  Once nerve damage occurs, it cannot be reversed. The goal of treatment is to keep the disease or nerve damage from getting worse and affecting more nerve fibers. Controlling your blood glucose level is the key. Most people with  radiculoplexus neuropathy see at least a partial improvement over time. You will need to keep your blood glucose and HbA1c levels in the target range determined by your health care provider. Things that help control blood glucose levels include:   Blood glucose monitoring.   Meal planning.   Physical activity.   Diabetes medicine.  Over time, maintaining lower blood glucose levels helps lessen symptoms. Sometimes, prescription pain medicine is needed. HOME CARE INSTRUCTIONS:  Do not smoke.  Keep your blood glucose level in the range that you and your health care provider have determined acceptable for you.  Keep your blood pressure level in the range that you and your health care provider have determined acceptable for you.  Eat a well-balanced diet.  Be active every day.  Check your feet every day. SEEK MEDICAL CARE IF:   You have burning, stabbing, or aching pain in the legs or feet.  You are unable to feel pressure or pain in your feet.  You develop problems with digestion such as:  Nausea.  Vomiting.  Bloating.  Constipation.  Diarrhea.  Abdominal pain.  You have difficulty with urination, such as:  Incontinence.  Retention.  You have palpitations.  You develop orthostatic hypotension. When you stand up you may feel:  Dizzy.  Weak.  Faint.  You cannot attain and maintain an erection (in men).  You have vaginal dryness and problems with decreased sexual desire and arousal (in women).  You have severe pain in your thighs, legs, or buttocks.  You have unexplained weight loss.   Document Released: 10/15/2001 Document Revised: 05/27/2013 Document Reviewed: 01/15/2013 [employer] Patient Information 2015 [employer]. This information is not intended to replace advice given to you by your health care provider. Make sure you discuss any questions you have with your health care provider. Plantar Fasciitis (Heel Spur Syndrome) with Rehab The plantar  fascia is a fibrous, ligament-like, soft-tissue structure that spans the bottom of the foot. Plantar fasciitis is a condition that causes pain in the foot due to inflammation of the tissue. SYMPTOMS   Pain and tenderness on the underneath side of the foot.  Pain that worsens with standing or walking. CAUSES  Plantar fasciitis is caused by irritation and injury to the plantar fascia on the underneath side of the foot. Common mechanisms of injury include:  Direct trauma to bottom of the foot.  Damage to a small nerve that runs under the foot where the main fascia attaches to the heel bone.  Stress placed on the plantar fascia due to bone spurs. RISK INCREASES WITH:   Activities that place stress on the plantar fascia (running, jumping, pivoting, or cutting).  Poor strength and flexibility.  Improperly fitted shoes.  Tight calf muscles.  Flat feet.  Failure to warm-up properly before activity.  Obesity. PREVENTION  Warm up and stretch properly before activity.  Allow for adequate recovery between workouts.  Maintain physical fitness:  Strength, flexibility, and endurance.  Cardiovascular fitness.  Maintain a health body weight.  Avoid stress on the plantar fascia.  Wear properly fitted shoes, including arch supports for individuals who have flat feet. PROGNOSIS  If treated properly, then the symptoms of plantar fasciitis usually resolve without surgery. However, occasionally surgery is necessary. RELATED COMPLICATIONS   Recurrent symptoms that may result in a chronic condition.  Problems of the lower back that are caused by compensating for the injury, such as limping.  Pain or weakness of the foot during push-off following surgery.  Chronic inflammation, scarring, and partial or complete fascia tear, occurring more often from repeated injections. TREATMENT  Treatment initially involves the use of ice and medication to help reduce pain and inflammation. The use  of strengthening and stretching exercises may help reduce pain with activity, especially stretches of the Achilles tendon. These exercises may be performed at home or with a therapist. Your caregiver may recommend that you use heel cups of arch supports to help reduce stress on the plantar fascia. Occasionally, corticosteroid injections are given to reduce inflammation. If symptoms persist for greater than 6 months despite non-surgical (conservative), then surgery may be recommended.  MEDICATION   If pain medication is necessary, then nonsteroidal anti-inflammatory medications, such as aspirin and ibuprofen, or other minor pain relievers, such as acetaminophen, are often recommended.  Do not take pain medication within 7 days before surgery.  Prescription pain relievers may be given if deemed necessary by your caregiver. Use only as directed and only as much as you need.  Corticosteroid injections may be given by your caregiver. These injections should be reserved for the most serious cases, because they may only be given a certain number of times. HEAT AND COLD  Cold treatment (icing) relieves pain and reduces inflammation. Cold treatment should be applied for 10 to 15 minutes every 2 to 3 hours for inflammation and pain and immediately after any activity that aggravates your symptoms. Use ice packs or massage the area with a piece of ice (ice massage).  Heat treatment may be used prior to performing the stretching and strengthening activities   prescribed by your caregiver, physical therapist, or athletic trainer. Use a heat pack or soak the injury in warm water. SEEK IMMEDIATE MEDICAL CARE IF:  Treatment seems to offer no benefit, or the condition worsens.  Any medications produce adverse side effects. EXERCISES RANGE OF MOTION (ROM) AND STRETCHING EXERCISES - Plantar Fasciitis (Heel Spur Syndrome) These exercises may help you when beginning to rehabilitate your injury. Your symptoms may  resolve with or without further involvement from your physician, physical therapist or athletic trainer. While completing these exercises, remember:   Restoring tissue flexibility helps normal motion to return to the joints. This allows healthier, less painful movement and activity.  An effective stretch should be held for at least 30 seconds.  A stretch should never be painful. You should only feel a gentle lengthening or release in the stretched tissue. RANGE OF MOTION - Toe Extension, Flexion  Sit with your right / left leg crossed over your opposite knee.  Grasp your toes and gently pull them back toward the top of your foot. You should feel a stretch on the bottom of your toes and/or foot.  Hold this stretch for __________ seconds.  Now, gently pull your toes toward the bottom of your foot. You should feel a stretch on the top of your toes and or foot.  Hold this stretch for __________ seconds. Repeat __________ times. Complete this stretch __________ times per day.  RANGE OF MOTION - Ankle Dorsiflexion, Active Assisted  Remove shoes and sit on a chair that is preferably not on a carpeted surface.  Place right / left foot under knee. Extend your opposite leg for support.  Keeping your heel down, slide your right / left foot back toward the chair until you feel a stretch at your ankle or calf. If you do not feel a stretch, slide your bottom forward to the edge of the chair, while still keeping your heel down.  Hold this stretch for __________ seconds. Repeat __________ times. Complete this stretch __________ times per day.  STRETCH - Gastroc, Standing  Place hands on wall.  Extend right / left leg, keeping the front knee somewhat bent.  Slightly point your toes inward on your back foot.  Keeping your right / left heel on the floor and your knee straight, shift your weight toward the wall, not allowing your back to arch.  You should feel a gentle stretch in the right / left  calf. Hold this position for __________ seconds. Repeat __________ times. Complete this stretch __________ times per day. STRETCH - Soleus, Standing  Place hands on wall.  Extend right / left leg, keeping the other knee somewhat bent.  Slightly point your toes inward on your back foot.  Keep your right / left heel on the floor, bend your back knee, and slightly shift your weight over the back leg so that you feel a gentle stretch deep in your back calf.  Hold this position for __________ seconds. Repeat __________ times. Complete this stretch __________ times per day. STRETCH - Gastrocsoleus, Standing  Note: This exercise can place a lot of stress on your foot and ankle. Please complete this exercise only if specifically instructed by your caregiver.   Place the ball of your right / left foot on a step, keeping your other foot firmly on the same step.  Hold on to the wall or a rail for balance.  Slowly lift your other foot, allowing your body weight to press your heel down over the edge of   the step.  You should feel a stretch in your right / left calf.  Hold this position for __________ seconds.  Repeat this exercise with a slight bend in your right / left knee. Repeat __________ times. Complete this stretch __________ times per day.  STRENGTHENING EXERCISES - Plantar Fasciitis (Heel Spur Syndrome)  These exercises may help you when beginning to rehabilitate your injury. They may resolve your symptoms with or without further involvement from your physician, physical therapist or athletic trainer. While completing these exercises, remember:   Muscles can gain both the endurance and the strength needed for everyday activities through controlled exercises.  Complete these exercises as instructed by your physician, physical therapist or athletic trainer. Progress the resistance and repetitions only as guided. STRENGTH - Towel Curls  Sit in a chair positioned on a non-carpeted  surface.  Place your foot on a towel, keeping your heel on the floor.  Pull the towel toward your heel by only curling your toes. Keep your heel on the floor.  If instructed by your physician, physical therapist or athletic trainer, add ____________________ at the end of the towel. Repeat __________ times. Complete this exercise __________ times per day. STRENGTH - Ankle Inversion  Secure one end of a rubber exercise band/tubing to a fixed object (table, pole). Loop the other end around your foot just before your toes.  Place your fists between your knees. This will focus your strengthening at your ankle.  Slowly, pull your big toe up and in, making sure the band/tubing is positioned to resist the entire motion.  Hold this position for __________ seconds.  Have your muscles resist the band/tubing as it slowly pulls your foot back to the starting position. Repeat __________ times. Complete this exercises __________ times per day.  Document Released: 08/06/2005 Document Revised: 10/29/2011 Document Reviewed: 11/18/2008 [employer] Patient Information 2015 [employer]. This information is not intended to replace advice given to you by your health care provider. Make sure you discuss any questions you have with your health care provider.  

## 2015-02-28 ENCOUNTER — Ambulatory Visit: Payer: No Typology Code available for payment source | Admitting: Podiatry

## 2015-02-28 ENCOUNTER — Encounter: Payer: Self-pay | Admitting: Podiatry

## 2015-02-28 VITALS — BP 103/67 | HR 70 | Resp 16

## 2015-02-28 DIAGNOSIS — M722 Plantar fascial fibromatosis: Secondary | ICD-10-CM

## 2015-02-28 MED ORDER — MELOXICAM 15 MG PO TABS: 15.0000 mg | ORAL_TABLET | Freq: Every day | ORAL | Status: DC

## 2015-02-28 NOTE — Progress Notes (Signed)
Patient ID: Margaret Austin, female   DOB: 1955-12-24, 59 y.o.   MRN: [id number]  Subjective: 59 year old female presents the office they for follow-up evaluation of left heel pain, plantar fasciitis and neuropathy. She states that she continues to have pain to the bottom part of her heel particularly in the morning or after periods of standing her feet all day however the pain has decreased. She states that the area feels tight. She is continuing stretching activities and ice daily. She states the meloxicam helped quite a bit. Denies any systemic complaints such as fevers, chills, nausea, vomiting. No acute changes since last appointment, and no other complaints at this time.   Objective: AAO x3, NAD; presents in a flat sandal with a Spanish interpreter. DP/PT pulses palpable bilaterally, CRT less than 3 seconds Protective sensation decreased with Simms Weinstein monofilament, vibratory sensation intact, Achilles tendon reflex intact There is mild continued tenderness to palpation overlying the plantar medial tubercle of the calcaneus to left heel at the insertion of the plantar fascia. There is no pain along the course of plantar fascia within the arch of the foot and the plantar fascia appears intact. There is no pain with lateral compression of the calcaneus or pain the vibratory sensation. No pain on the posterior aspect of the calcaneus or along the course/insertion of the Achilles tendon. There is no overlying edema, erythema, increase in warmth. No other areas of tenderness palpation or pain with vibratory sensation to the foot/ankle. MMT 5/5, ROM WNL No open lesions or pre-ulcerative lesions are identified. No pain with calf compression, swelling, warmth, erythema.   Assessment: 44 show female with resolving left heel pain, plantar fasciitis  Plan: -All treatment options discussed with the patient including all alternatives, risks, complications.  -Refill meloxicam 15 mg. Discussed side  effects and what is happening are to occur call the office. She had no side effects previously. -Continue stretching activities on a consistent basis. -Discussed shoe gear modifications and recommended her to wear supportive shoe. -Discussed steroid injection however she states her sugar has been high recently. -Follow-up 6 weeks or sooner if any problems arise. In the meantime, encouraged to call the office with any questions, concerns, change in symptoms.  -Patient encouraged to call the office with any questions, concerns, change in symptoms.   Celesta Gentile, DPM

## 2015-02-28 NOTE — Patient Instructions (Signed)
Plantar Fasciitis (Heel Spur Syndrome) with Rehab The plantar fascia is a fibrous, ligament-like, soft-tissue structure that spans the bottom of the foot. Plantar fasciitis is a condition that causes pain in the foot due to inflammation of the tissue. SYMPTOMS   Pain and tenderness on the underneath side of the foot.  Pain that worsens with standing or walking. CAUSES  Plantar fasciitis is caused by irritation and injury to the plantar fascia on the underneath side of the foot. Common mechanisms of injury include:  Direct trauma to bottom of the foot.  Damage to a small nerve that runs under the foot where the main fascia attaches to the heel bone.  Stress placed on the plantar fascia due to bone spurs. RISK INCREASES WITH:   Activities that place stress on the plantar fascia (running, jumping, pivoting, or cutting).  Poor strength and flexibility.  Improperly fitted shoes.  Tight calf muscles.  Flat feet.  Failure to warm-up properly before activity.  Obesity. PREVENTION  Warm up and stretch properly before activity.  Allow for adequate recovery between workouts.  Maintain physical fitness:  Strength, flexibility, and endurance.  Cardiovascular fitness.  Maintain a health body weight.  Avoid stress on the plantar fascia.  Wear properly fitted shoes, including arch supports for individuals who have flat feet. PROGNOSIS  If treated properly, then the symptoms of plantar fasciitis usually resolve without surgery. However, occasionally surgery is necessary. RELATED COMPLICATIONS   Recurrent symptoms that may result in a chronic condition.  Problems of the lower back that are caused by compensating for the injury, such as limping.  Pain or weakness of the foot during push-off following surgery.  Chronic inflammation, scarring, and partial or complete fascia tear, occurring more often from repeated injections. TREATMENT  Treatment initially involves the use of  ice and medication to help reduce pain and inflammation. The use of strengthening and stretching exercises may help reduce pain with activity, especially stretches of the Achilles tendon. These exercises may be performed at home or with a therapist. Your caregiver may recommend that you use heel cups of arch supports to help reduce stress on the plantar fascia. Occasionally, corticosteroid injections are given to reduce inflammation. If symptoms persist for greater than 6 months despite non-surgical (conservative), then surgery may be recommended.  MEDICATION   If pain medication is necessary, then nonsteroidal anti-inflammatory medications, such as aspirin and ibuprofen, or other minor pain relievers, such as acetaminophen, are often recommended.  Do not take pain medication within 7 days before surgery.  Prescription pain relievers may be given if deemed necessary by your caregiver. Use only as directed and only as much as you need.  Corticosteroid injections may be given by your caregiver. These injections should be reserved for the most serious cases, because they may only be given a certain number of times. HEAT AND COLD  Cold treatment (icing) relieves pain and reduces inflammation. Cold treatment should be applied for 10 to 15 minutes every 2 to 3 hours for inflammation and pain and immediately after any activity that aggravates your symptoms. Use ice packs or massage the area with a piece of ice (ice massage).  Heat treatment may be used prior to performing the stretching and strengthening activities prescribed by your caregiver, physical therapist, or athletic trainer. Use a heat pack or soak the injury in warm water. SEEK IMMEDIATE MEDICAL CARE IF:  Treatment seems to offer no benefit, or the condition worsens.  Any medications produce adverse side effects. EXERCISES RANGE   OF MOTION (ROM) AND STRETCHING EXERCISES - Plantar Fasciitis (Heel Spur Syndrome) These exercises may help you  when beginning to rehabilitate your injury. Your symptoms may resolve with or without further involvement from your physician, physical therapist or athletic trainer. While completing these exercises, remember:   Restoring tissue flexibility helps normal motion to return to the joints. This allows healthier, less painful movement and activity.  An effective stretch should be held for at least 30 seconds.  A stretch should never be painful. You should only feel a gentle lengthening or release in the stretched tissue. RANGE OF MOTION - Toe Extension, Flexion  Sit with your right / left leg crossed over your opposite knee.  Grasp your toes and gently pull them back toward the top of your foot. You should feel a stretch on the bottom of your toes and/or foot.  Hold this stretch for __________ seconds.  Now, gently pull your toes toward the bottom of your foot. You should feel a stretch on the top of your toes and or foot.  Hold this stretch for __________ seconds. Repeat __________ times. Complete this stretch __________ times per day.  RANGE OF MOTION - Ankle Dorsiflexion, Active Assisted  Remove shoes and sit on a chair that is preferably not on a carpeted surface.  Place right / left foot under knee. Extend your opposite leg for support.  Keeping your heel down, slide your right / left foot back toward the chair until you feel a stretch at your ankle or calf. If you do not feel a stretch, slide your bottom forward to the edge of the chair, while still keeping your heel down.  Hold this stretch for __________ seconds. Repeat __________ times. Complete this stretch __________ times per day.  STRETCH - Gastroc, Standing  Place hands on wall.  Extend right / left leg, keeping the front knee somewhat bent.  Slightly point your toes inward on your back foot.  Keeping your right / left heel on the floor and your knee straight, shift your weight toward the wall, not allowing your back to  arch.  You should feel a gentle stretch in the right / left calf. Hold this position for __________ seconds. Repeat __________ times. Complete this stretch __________ times per day. STRETCH - Soleus, Standing  Place hands on wall.  Extend right / left leg, keeping the other knee somewhat bent.  Slightly point your toes inward on your back foot.  Keep your right / left heel on the floor, bend your back knee, and slightly shift your weight over the back leg so that you feel a gentle stretch deep in your back calf.  Hold this position for __________ seconds. Repeat __________ times. Complete this stretch __________ times per day. STRETCH - Gastrocsoleus, Standing  Note: This exercise can place a lot of stress on your foot and ankle. Please complete this exercise only if specifically instructed by your caregiver.   Place the ball of your right / left foot on a step, keeping your other foot firmly on the same step.  Hold on to the wall or a rail for balance.  Slowly lift your other foot, allowing your body weight to press your heel down over the edge of the step.  You should feel a stretch in your right / left calf.  Hold this position for __________ seconds.  Repeat this exercise with a slight bend in your right / left knee. Repeat __________ times. Complete this stretch __________ times per day.    STRENGTHENING EXERCISES - Plantar Fasciitis (Heel Spur Syndrome)  These exercises may help you when beginning to rehabilitate your injury. They may resolve your symptoms with or without further involvement from your physician, physical therapist or athletic trainer. While completing these exercises, remember:   Muscles can gain both the endurance and the strength needed for everyday activities through controlled exercises.  Complete these exercises as instructed by your physician, physical therapist or athletic trainer. Progress the resistance and repetitions only as guided. STRENGTH -  Towel Curls  Sit in a chair positioned on a non-carpeted surface.  Place your foot on a towel, keeping your heel on the floor.  Pull the towel toward your heel by only curling your toes. Keep your heel on the floor.  If instructed by your physician, physical therapist or athletic trainer, add ____________________ at the end of the towel. Repeat __________ times. Complete this exercise __________ times per day. STRENGTH - Ankle Inversion  Secure one end of a rubber exercise band/tubing to a fixed object (table, pole). Loop the other end around your foot just before your toes.  Place your fists between your knees. This will focus your strengthening at your ankle.  Slowly, pull your big toe up and in, making sure the band/tubing is positioned to resist the entire motion.  Hold this position for __________ seconds.  Have your muscles resist the band/tubing as it slowly pulls your foot back to the starting position. Repeat __________ times. Complete this exercises __________ times per day.  Document Released: 08/06/2005 Document Revised: 10/29/2011 Document Reviewed: 11/18/2008 [employer] Patient Information 2015 [employer]. This information is not intended to replace advice given to you by your health care provider. Make sure you discuss any questions you have with your health care provider.  

## 2015-04-11 ENCOUNTER — Encounter: Payer: Self-pay | Admitting: Podiatry

## 2015-04-11 ENCOUNTER — Ambulatory Visit: Payer: No Typology Code available for payment source | Admitting: Podiatry

## 2015-04-11 VITALS — BP 109/70 | HR 86 | Resp 18

## 2015-04-11 DIAGNOSIS — M722 Plantar fascial fibromatosis: Secondary | ICD-10-CM

## 2015-04-11 MED ORDER — TRIAMCINOLONE ACETONIDE 10 MG/ML IJ SUSP
10.0000 mg | Freq: Once | INTRAMUSCULAR | Status: AC
  Administered 2015-04-11: 10 mg

## 2015-04-11 MED ORDER — MELOXICAM 15 MG PO TABS: 15.0000 mg | ORAL_TABLET | Freq: Every day | ORAL | Status: DC

## 2015-04-11 NOTE — Progress Notes (Signed)
Patient ID: Margaret Austin, female   DOB: 12-11-1955, 59 y.o.   MRN: [id number]  Subjective: 59 year old female presents the office today for follow-up evaluation left heel pain. She states that he is a Curator left heel although does appear to be significantly improved compared to what it was last appointment. She does state that the meloxicam is helping and she was having no side effects. She continues with stretching and icing activities daily. She is also purchased new shoes which seem to help. No other complaints at this time.  Objective: AAO x3, NAD DP/PT pulses palpable bilaterally, CRT less than 3 seconds Protective sensation intact with Simms Weinstein monofilament, vibratory sensation intact, Achilles tendon reflex intact There is mild continued tenderness to palpation overlying the plantar medial tubercle of the calcaneus to left heel at the insertion of the plantar fascia. There is no pain along the course of plantar fascial within the arch of the foot. There is no pain with lateral compression of the calcaneus or pain the vibratory sensation. No pain on the posterior aspect of the calcaneus or along the course/insertion of the Achilles tendon. There is no overlying edema, erythema, increase in warmth. No other areas of tenderness palpation or pain with vibratory sensation to the foot/ankle. No open lesions or pre-ulcerative lesions are identified. No pain with calf compression, swelling, warmth, erythema.  Plan: -Treatment options discussed including all alternatives, risks, and complications -Patient elects to proceed with steroid injection into the left heel. Under sterile skin preparation, a total of 2.5cc of kenalog 10, 0.5% Marcaine plain, and 2% lidocaine plain were infiltrated into the symptomatic area without complication. A band-aid was applied. Patient tolerated the injection well without complication. Post-injection care with discussed with the patient. Discussed with the  patient to ice the area over the next couple of days to help prevent a steroid flare.  -Plantar fascial taping applied -Continue with shoe gear modifications. Discussed orthotics. -Stretching exercises and icing daily. -Follow-up 4 weeks or sooner if any problems arise. In the meantime, encouraged to call the office with any questions, concerns, change in symptoms.  Celesta Gentile, DPM

## 2015-05-09 ENCOUNTER — Encounter: Payer: Self-pay | Admitting: Podiatry

## 2015-05-09 ENCOUNTER — Ambulatory Visit: Payer: No Typology Code available for payment source | Admitting: Podiatry

## 2015-05-09 VITALS — BP 114/57 | HR 63 | Resp 18

## 2015-05-09 DIAGNOSIS — M722 Plantar fascial fibromatosis: Secondary | ICD-10-CM

## 2015-05-09 MED ORDER — MELOXICAM 15 MG PO TABS: 15.0000 mg | ORAL_TABLET | Freq: Every day | ORAL | Status: DC

## 2015-05-09 NOTE — Progress Notes (Signed)
Patient ID: Margaret Austin, female   DOB: 10-16-1955, 59 y.o.   MRN: [id number]  Subjective: 59 year old female to the office today for follow up with vibration left heel pain, plantar fasciitis. She states that since last appointment she does feel better and her pain is subsided. She gets some intermittent discomfort continuing to the bottom of her heel. She is continuing the stretching and icing activities daily. She denies any complications after the injection last appointment. Chest continue systems she's had no some burning to the bottom of her heels. The pain does not wake her up at night. No other complaints at this time. Denies any redness or swelling.  Objective: AAO x3, NAD DP/PT pulses palpable bilaterally, CRT less than 3 seconds Protective sensation intact with Simms Weinstein monofilament, vibratory sensation intact, Achilles tendon reflex intact There is mild tenderness to palpation overlying the plantar medial tubercle of the calcaneus to the left heel at the insertion of the plantar fascia, but decreased since last appointment. There is no pain along the course of plantar fascial within the arch of the foot. There is no pain with lateral compression of the calcaneus or pain the vibratory sensation. No pain on the posterior aspect of the calcaneus or along the course/insertion of the Achilles tendon. There is no overlying edema, erythema, increase in warmth. No other areas of tenderness palpation or pain with vibratory sensation to the foot/ankle.  No open lesions or pre-ulcerative lesions are identified. No pain with calf compression, swelling, warmth, erythema.  Assessment: 59 year old female with resolving she'll pain, plantar fasciitis with possible neuropathy  Plan: -Treatment options discussed including all alternatives, risks, and complications -Patient elects to proceed with steroid injection into the left heel. Under sterile skin preparation, a total of 2.5cc of kenalog  10, 0.5% Marcaine plain, and 2% lidocaine plain were infiltrated into the symptomatic area without complication. A band-aid was applied. Patient tolerated the injection well without complication. Post-injection care with discussed with the patient. Discussed with the patient to ice the area over the next couple of days to help prevent a steroid flare. Monitor blood sugar.  -Removal plantar fascial taken was applied to the left foot. -Continue meloxicam. Refill was prescribed today. -Continue stretching, icing activities daily. -Continue with supportive shoe gear. -I discussed possible neuropathy treatment. However her symptoms seem to be resolving with treatment for plantar fasciitis. We'll consider neuropathy treatment in the future. -Follow-up in 4 weeks or sooner if any problems arise. In the meantime, encouraged to call the office with any questions, concerns, change in symptoms.   Celesta Gentile, DPM

## 2015-05-17 ENCOUNTER — Encounter (INDEPENDENT_AMBULATORY_CARE_PROVIDER_SITE_OTHER): Payer: Self-pay

## 2015-05-17 ENCOUNTER — Ambulatory Visit: Payer: Self-pay | Attending: Internal Medicine

## 2015-06-06 ENCOUNTER — Encounter: Payer: Self-pay | Admitting: Podiatry

## 2015-06-06 ENCOUNTER — Ambulatory Visit (INDEPENDENT_AMBULATORY_CARE_PROVIDER_SITE_OTHER): Payer: No Typology Code available for payment source | Admitting: Podiatry

## 2015-06-06 VITALS — BP 101/58 | HR 75 | Resp 18

## 2015-06-06 DIAGNOSIS — M722 Plantar fascial fibromatosis: Secondary | ICD-10-CM

## 2015-06-06 MED ORDER — DICLOFENAC SODIUM 75 MG PO TBEC: 75.0000 mg | DELAYED_RELEASE_TABLET | Freq: Two times a day (BID) | ORAL | Status: DC

## 2015-06-06 NOTE — Patient Instructions (Signed)

## 2015-06-06 NOTE — Progress Notes (Signed)
Patient ID: Margaret Austin, female   DOB: 10/29/1955, 59 y.o.   MRN: [id number]  Subjective: 59 year old female to the office today for follow up evaluation of left heel pain, plantar fasciitis.  She says the pain to her heel is better than what it was previously although discontinued. She states that the pain is mostly in the morning and she first gets up or after standing for long period of time. She does get numbness  And tingling to her feet as well. She is not taking any medication for neuropathy at this time. She is continuing stretching icing activities daily. He tries to wear supportive shoe as much as possible. The pain does not wake her up at night. No other complaints at this time in no acute changes since last appointment. She denies any  Systemic complaints such as fevers, chills, nausea, vomiting. No calf pain, chest pain, shortness of breath.  Objective: AAO x3, NAD DP/PT pulses palpable bilaterally, CRT less than 3 seconds Protective sensation intact with Simms Weinstein monofilament There is mild continued tenderness to palpation overlying the plantar medial tubercle of the calcaneus to the left heel at the insertion of the plantar fascia. There is no pain along the course of plantar fascia within the arch of the foot and the plantar fascia appears intact. There is no pain with lateral compression of the calcaneus or pain the vibratory sensation. No pain on the posterior aspect of the calcaneus or along the course/insertion of the Achilles tendon. There is no overlying edema, erythema, increase in warmth. No other areas of tenderness palpation or pain with vibratory sensation to the foot/ankle.  No open lesions or pre-ulcerative lesions are identified. No pain with calf compression, swelling, warmth, erythema.  Assessment: 59 year old female with resolving heelpain, plantar fasciitis with possible neuropathy  Plan: -Treatment options discussed including all alternatives, risks,  and complications -Patient elects to proceed with steroid injection into the left heel. This is her 3rd injection. Under sterile skin preparation, a total of 2.5cc of kenalog 10, 0.5% Marcaine plain, and 2% lidocaine plain were infiltrated into the symptomatic area without complication. A band-aid was applied. Patient tolerated the injection well without complication. Post-injection care with discussed with the patient. Discussed with the patient to ice the area over the next couple of days to help prevent a steroid flare. Monitor blood sugar.  -Removal plantar fascial taken was applied to the left foot. -She states the meloxicam does not help much. Switched to Voltaren.  -Continue stretching, icing activities daily. -Continue with supportive shoe gear. - again discussed possible treatment for neuropathy however her symptoms appear to be localized to her heel. If in the next appointment if symptoms are not improved will possibly start treatment. -Follow-up in 4 weeks or sooner if any problems arise. In the meantime, encouraged to call the office with any questions, concerns, change in symptoms.   Celesta Gentile, DPM

## 2015-06-14 ENCOUNTER — Ambulatory Visit: Payer: Self-pay | Admitting: Internal Medicine

## 2015-07-11 ENCOUNTER — Encounter: Payer: Self-pay | Admitting: Podiatry

## 2015-07-11 ENCOUNTER — Ambulatory Visit (INDEPENDENT_AMBULATORY_CARE_PROVIDER_SITE_OTHER): Payer: No Typology Code available for payment source | Admitting: Podiatry

## 2015-07-11 VITALS — BP 98/57 | HR 66 | Resp 18

## 2015-07-11 DIAGNOSIS — M722 Plantar fascial fibromatosis: Secondary | ICD-10-CM

## 2015-07-11 MED ORDER — DICLOFENAC SODIUM 75 MG PO TBEC: 75.0000 mg | DELAYED_RELEASE_TABLET | Freq: Two times a day (BID) | ORAL | Status: DC

## 2015-07-12 NOTE — Progress Notes (Signed)
Patient ID: Margaret Austin, female   DOB: [birth year], 59 y.o.   MRN: [id number]  Subjective: Margaret Austin presents to the office today for follow-up evaluation of left heel pain. They state that they are doing her but still continues to have some pain to her heel mostly save for long periods of time. She is requesting another steroid injection at this time.. They have been icing, stretching, try to wear supportive shoe as much as possible. She is also beginning to the plantar fascial brace/removable taping.  No other complaints at this time. No acute changes since last appointment. They deny any systemic complaints such as fevers, chills, nausea, vomiting.  Objective: General: AAO x3, NAD  Dermatological: Skin is warm, dry and supple bilateral. Nails x 10 are well manicured; remaining integument appears unremarkable at this time. There are no open sores, no preulcerative lesions, no rash or signs of infection present.  Vascular: Dorsalis Pedis artery and Posterior Tibial artery pedal pulses are 2/4 bilateral with immedate capillary fill time. Pedal hair growth present. There is no pain with calf compression, swelling, warmth, erythema.   Neruologic: Grossly intact via light touch bilateral. Vibratory intact via tuning fork bilateral. Protective threshold with Semmes Wienstein monofilament intact to all pedal sites bilateral.   Musculoskeletal: There is mild continue tenderness palpation along the plantar medial tubercle of the calcaneus at the insertion of the plantar fascia on the left foot. There is no pain along the course of the plantar fascia within the arch of the foot. Plantar fascia appears to be intact bilaterally. There is no pain with lateral compression of the calcaneus and there is no pain with vibratory sensation. There is no pain along the course or insertion of the Achilles tendon. There are no other areas of tenderness to bilateral lower extremities. No gross boney pedal  deformities bilateral. No pain, crepitus, or limitation noted with foot and ankle range of motion bilateral. Muscular strength 5/5 in all groups tested bilateral.  Gait: Unassisted, Nonantalgic.   Assessment: Presents for follow-up evaluation for heel pain, likely plantar fasciitis   Plan: -Treatment options discussed including all alternatives, risks, and complications -Patient elects to proceed with steroid injection into the left heel. She discussed risks of multiple steroid injections for which she understands and wishes to proceed. Under sterile skin preparation, a total of 2.5cc of kenalog 10, 0.5% Marcaine plain, and 2% lidocaine plain were infiltrated into the symptomatic area without complication. A band-aid was applied. Patient tolerated the injection well without complication. Post-injection care with discussed with the patient. Discussed with the patient to ice the area over the next couple of days to help prevent a steroid flare.  -Discussed inserts. She will look at purchase an orthotic over-the-counter. -Ice and stretching exercises on a daily basis. -Continue supportive shoe gear. -Follow-up in 4-6 weeks or sooner if any problems arise. In the meantime, encouraged to call the office with any questions, concerns, change in symptoms.   Celesta Gentile, DPM

## 2015-08-16 ENCOUNTER — Ambulatory Visit: Payer: No Typology Code available for payment source | Admitting: Podiatry

## 2015-09-16 ENCOUNTER — Ambulatory Visit: Payer: Self-pay | Attending: Internal Medicine | Admitting: Internal Medicine

## 2015-09-16 ENCOUNTER — Encounter: Payer: Self-pay | Admitting: Internal Medicine

## 2015-09-16 VITALS — BP 124/84 | HR 69 | Temp 98.0°F | Resp 16 | Ht 61.0 in | Wt 178.0 lb

## 2015-09-16 DIAGNOSIS — Z9114 Patient's other noncompliance with medication regimen: Secondary | ICD-10-CM | POA: Insufficient documentation

## 2015-09-16 DIAGNOSIS — B373 Candidiasis of vulva and vagina: Secondary | ICD-10-CM | POA: Insufficient documentation

## 2015-09-16 DIAGNOSIS — Z794 Long term (current) use of insulin: Secondary | ICD-10-CM | POA: Insufficient documentation

## 2015-09-16 DIAGNOSIS — E119 Type 2 diabetes mellitus without complications: Secondary | ICD-10-CM | POA: Insufficient documentation

## 2015-09-16 DIAGNOSIS — H9203 Otalgia, bilateral: Secondary | ICD-10-CM | POA: Insufficient documentation

## 2015-09-16 DIAGNOSIS — B3731 Acute candidiasis of vulva and vagina: Secondary | ICD-10-CM

## 2015-09-16 DIAGNOSIS — Z79899 Other long term (current) drug therapy: Secondary | ICD-10-CM | POA: Insufficient documentation

## 2015-09-16 DIAGNOSIS — Z7984 Long term (current) use of oral hypoglycemic drugs: Secondary | ICD-10-CM | POA: Insufficient documentation

## 2015-09-16 DIAGNOSIS — J069 Acute upper respiratory infection, unspecified: Secondary | ICD-10-CM | POA: Insufficient documentation

## 2015-09-16 LAB — GLUCOSE, POCT (MANUAL RESULT ENTRY): POC GLUCOSE: 277 mg/dL — AB (ref 70–99)

## 2015-09-16 LAB — POCT GLYCOSYLATED HEMOGLOBIN (HGB A1C): Hemoglobin A1C: 12.1

## 2015-09-16 MED ORDER — INSULIN GLARGINE 100 UNIT/ML SOLOSTAR PEN: 18.0000 [IU] | PEN_INJECTOR | Freq: Every day | SUBCUTANEOUS | Status: DC

## 2015-09-16 MED ORDER — METFORMIN HCL 1000 MG PO TABS: 1000.0000 mg | ORAL_TABLET | Freq: Two times a day (BID) | ORAL | Status: DC

## 2015-09-16 MED ORDER — MICONAZOLE NITRATE 100 MG VA SUPP: 100.0000 mg | Freq: Every day | VAGINAL | Status: DC

## 2015-09-16 MED ORDER — AMOXICILLIN 500 MG PO CAPS: 500.0000 mg | ORAL_CAPSULE | Freq: Two times a day (BID) | ORAL | Status: DC

## 2015-09-16 NOTE — Progress Notes (Signed)
Patient here for follow up  Complains of congestion, bilateral ear pain and cough Patient also stated her blood sugars have been running high

## 2015-09-16 NOTE — Progress Notes (Signed)
Patient ID: Margaret Austin, female   DOB: 20-Jul-1956, 60 y.o.   MRN: [id number] SUBJECTIVE: 60 y.o. female for follow up of diabetes. Sugars increased to 500 yesterday but was 260 today and she has not ate breakfast. Currently only takes 15 units of Lantus but has been out of insulin for one day. She has continued to use Metformin daily.  She is not adherent to a diabetic diet. Symptoms of congestion, bilateral ear pain, cough for 2 weeks. She has tried OTC medications without relief. Vaginal itch that has been present for over one week.   Current Outpatient Prescriptions  Medication Sig Dispense Refill  . azithromycin (ZITHROMAX Z-PAK) 250 MG tablet Take as directed 6 each 0  . cetirizine (ZYRTEC) 10 MG tablet Take 1 tablet (10 mg total) by mouth daily. 30 tablet 11  . diclofenac (VOLTAREN) 75 MG EC tablet Take 1 tablet (75 mg total) by mouth 2 (two) times daily. 45 tablet 0  . diclofenac (VOLTAREN) 75 MG EC tablet Take 1 tablet (75 mg total) by mouth 2 (two) times daily. 30 tablet 2  . docusate sodium (RA COL-RITE) 100 MG capsule Take 100 mg by mouth daily.    . fluticasone (FLONASE) 50 MCG/ACT nasal spray Place 2 sprays into both nostrils daily. 16 g 3  . glucose blood (TRUETEST TEST) test strip Check blood sugar TID & QHS 100 each 12  . Insulin Glargine (LANTUS) 100 UNIT/ML Solostar Pen Inject 15 Units into the skin daily at 10 pm. 15 mL 3  . Insulin Pen Needle (ULTICARE PEN NEEDLES) 29G X 12.7MM MISC 15 Units by Does not apply route at bedtime. 1 each 2  . metFORMIN (GLUCOPHAGE) 1000 MG tablet Take 1 tablet (1,000 mg total) by mouth 2 (two) times daily with a meal. 180 tablet 3  . mometasone (NASONEX) 50 MCG/ACT nasal spray Place 2 sprays into the nose daily. 17 g 12  . oxyCODONE-acetaminophen (PERCOCET/ROXICET) 5-325 MG per tablet Take 1 tablet by mouth every 4 (four) hours as needed for severe pain. 20 tablet 0  . oxymetazoline (AFRIN NASAL SPRAY) 0.05 % nasal spray Place 1 spray into  both nostrils 2 (two) times daily. 30 mL 0  . TRUEPLUS LANCETS 28G MISC Check blood sugar TID & QHS 100 each 2   No current facility-administered medications for this visit.  Review of Systems  Eyes: Positive for double vision.  Genitourinary:       Vaginal itch  Neurological: Positive for tingling.  All other systems reviewed and are negative.   OBJECTIVE: Appearance: alert, well appearing, and in no distress, oriented to person, place, and time and overweight. BP 124/84 mmHg  Pulse 69  Temp(Src) 98 F (36.7 C)  Resp 16  Ht 5\' 1"  (1.549 m)  Wt 178 lb (80.74 kg)  BMI 33.65 kg/m2  SpO2 100%  Exam: heart sounds normal rate, regular rhythm, normal S1, S2, no murmurs, rubs, clicks or gallops, no JVD, chest clear, no hepatosplenomegaly, no carotid bruits, feet: warm, good capillary refill, no trophic changes or ulcerative lesions, normal DP and PT pulses, normal monofilament exam and normal sensory exam. Normal ears, nose, and oropharynx  ASSESSMENT: Margaret Austin was seen today for uri.  Diagnoses and all orders for this visit:  Type 2 diabetes mellitus without complication, without long-term current use of insulin (HCC) -     Glucose (CBG) -     HgB A1c -     Microalbumin, urine -     Insulin Glargine (LANTUS)  100 UNIT/ML Solostar Pen; Inject 18 Units into the skin daily at 10 pm. -     metFORMIN (GLUCOPHAGE) 1000 MG tablet; Take 1 tablet (1,000 mg total) by mouth 2 (two) times daily with a meal. Patients diabetes remains uncontrolled as evidence by hemoglobin a1c >8.  Patient has been non-compliant with medication regimen. Stressed the multiple complications associated with uncontrolled diabetes.  Patient will stay on current medication dose and report back to clinic with cbg log in 2 weeks.  URI (upper respiratory infection) -     Begin amoxicillin (AMOXIL) 500 MG capsule; Take 1 capsule (500 mg total) by mouth 2 (two) times daily.  Vaginal candida -     Begin miconazole (MICOTIN)  100 MG vaginal suppository; Place 1 suppository (100 mg total) vaginally at bedtime. Vaginal yeast due to uncontrolled sugars. Stressed importance of strict glycemic control.    PLAN: See orders for this visit as documented in the electronic medical record. Issues reviewed with her: low cholesterol diet, weight control and daily exercise discussed, foot care discussed and Podiatry visits discussed and annual eye examinations at Ophthalmology discussed.  Due to language barrier, an interpreter was present during the history-taking and subsequent discussion (and for part of the physical exam) with this patient.   Return in about 2 weeks (around 09/30/2015) for Nurse Visit-log review and 3 mo PCP DM.   Lance Bosch, NP 09/23/2015 10:06 AM

## 2015-09-17 LAB — MICROALBUMIN, URINE: MICROALB UR: 1.4 mg/dL

## 2015-10-04 ENCOUNTER — Encounter: Payer: Self-pay | Admitting: Pharmacist

## 2015-10-11 ENCOUNTER — Ambulatory Visit: Payer: Self-pay | Attending: Internal Medicine | Admitting: Pharmacist

## 2015-10-11 ENCOUNTER — Encounter: Payer: Self-pay | Admitting: Pharmacist

## 2015-10-11 DIAGNOSIS — E119 Type 2 diabetes mellitus without complications: Secondary | ICD-10-CM

## 2015-10-11 MED ORDER — GLUCOSE BLOOD VI STRP: ORAL_STRIP | Status: DC

## 2015-10-11 MED ORDER — TRUEPLUS LANCETS 28G MISC: Status: DC

## 2015-10-11 MED ORDER — TRUE METRIX METER W/DEVICE KIT: PACK | Status: DC

## 2015-10-11 NOTE — Progress Notes (Signed)
S:    Patient arrives in good spirits.  Presents for diabetes evaluation, education, and management at the request of Chari Manning, NP. Patient was referred on 09/16/15.  Patient was last seen by Primary Care Provider on 09/16/15. A virtual interpreter Darel Hong [phone]) was used for the duration of the visit.  Patient reports adherence with medications.  Current diabetes medications include: Lantus 18 units daily and metformin 100 mg BID.  Patient reports hypoglycemic events. She reports a reading of 75 but it is not recorded on her log book and she does not remember when it occurred.  Patient reported exercise habits: none   Patient reports nocturia.  Patient reports neuropathy. Patient denies visual changes. Patient reports self foot exams.   Patient reports that she cannot read well and so she did not understand how to use the blood glucose log book. She also reports that her meter is broken so she has been using a friend's. She would like a new meter.   She also reports that she has continued vaginal itching. She understands that this is due to her high blood glucose. She denies ever using the vaginal suppositories but does report using the antibiotic.   O:  Lab Results  Component Value Date   HGBA1C 12.10 09/16/2015   There were no vitals filed for this visit.  Home fasting CBG: 105-300s (over last week 100s-200s) 2 hour post-prandial/random CBG: 200s-300s (over last week 200s).   A/P: Diabetes currently uncontrolled based on A1c of 12.1 and home CBGs. Patient reports hypoglycemic events and is able to verbalize appropriate hypoglycemia management plan. Patient reports adherence with medication. Control is suboptimal due to dietary indiscretion and sedentary lifestyle.  Continued Lantus 18 units and metformin 1000 mg BID. Patient educated on use of the blood glucose log book. It was too hard for me to tell what was what based on the log book but it appears her blood glucose has  improved to the 100s and 200s over the last week. Patient to monitor blood glucose at home and return with her meter and log book.  Patient to start the miconazole vaginal suppository as directed by Chari Manning, NP. Patient instructed on how to use it and to return to see Chari Manning if her symptoms do not improve after use. Patient verbalized understanding.    Next A1C anticipated April 2017.    Written patient instructions provided.  Total time in face to face counseling 20 minutes.   Follow up in Pharmacist Clinic Visit in 2 weeks.

## 2015-10-11 NOTE — Patient Instructions (Addendum)
Thanks for coming to see me!  Schedule an appointment with Chari Manning for vagina itching if it continues  Follow up with me 2 weeks

## 2015-10-25 ENCOUNTER — Encounter: Payer: Self-pay | Admitting: Family Medicine

## 2015-10-25 ENCOUNTER — Ambulatory Visit: Payer: Self-pay | Attending: Internal Medicine | Admitting: Pharmacist

## 2015-10-25 ENCOUNTER — Ambulatory Visit (HOSPITAL_BASED_OUTPATIENT_CLINIC_OR_DEPARTMENT_OTHER): Payer: Worker's Compensation | Admitting: Family Medicine

## 2015-10-25 VITALS — BP 115/75 | HR 85 | Temp 98.5°F | Resp 15 | Ht 61.0 in | Wt 176.0 lb

## 2015-10-25 DIAGNOSIS — K219 Gastro-esophageal reflux disease without esophagitis: Secondary | ICD-10-CM

## 2015-10-25 DIAGNOSIS — R739 Hyperglycemia, unspecified: Secondary | ICD-10-CM | POA: Diagnosis not present

## 2015-10-25 DIAGNOSIS — Z794 Long term (current) use of insulin: Secondary | ICD-10-CM

## 2015-10-25 DIAGNOSIS — J0100 Acute maxillary sinusitis, unspecified: Secondary | ICD-10-CM | POA: Diagnosis not present

## 2015-10-25 DIAGNOSIS — E1165 Type 2 diabetes mellitus with hyperglycemia: Secondary | ICD-10-CM | POA: Diagnosis not present

## 2015-10-25 DIAGNOSIS — R351 Nocturia: Secondary | ICD-10-CM | POA: Insufficient documentation

## 2015-10-25 DIAGNOSIS — G629 Polyneuropathy, unspecified: Secondary | ICD-10-CM | POA: Insufficient documentation

## 2015-10-25 DIAGNOSIS — Z79899 Other long term (current) drug therapy: Secondary | ICD-10-CM | POA: Insufficient documentation

## 2015-10-25 DIAGNOSIS — E119 Type 2 diabetes mellitus without complications: Secondary | ICD-10-CM | POA: Insufficient documentation

## 2015-10-25 LAB — POCT URINALYSIS DIPSTICK
BILIRUBIN UA: NEGATIVE
GLUCOSE UA: 500
Ketones, UA: 15
LEUKOCYTES UA: NEGATIVE
NITRITE UA: NEGATIVE
Protein, UA: NEGATIVE
Spec Grav, UA: 1.01
Urobilinogen, UA: 0.2
pH, UA: 6

## 2015-10-25 LAB — GLUCOSE, POCT (MANUAL RESULT ENTRY)
POC GLUCOSE: 450 mg/dL — AB (ref 70–99)
POC Glucose: 297 mg/dl — AB (ref 70–99)

## 2015-10-25 MED ORDER — INSULIN ASPART 100 UNIT/ML ~~LOC~~ SOLN
20.0000 [IU] | Freq: Once | SUBCUTANEOUS | Status: AC
  Administered 2015-10-25: 20 [IU] via SUBCUTANEOUS

## 2015-10-25 MED ORDER — AMOXICILLIN 500 MG PO CAPS: 500.0000 mg | ORAL_CAPSULE | Freq: Three times a day (TID) | ORAL | Status: DC

## 2015-10-25 MED ORDER — INSULIN GLARGINE 100 UNIT/ML SOLOSTAR PEN: 25.0000 [IU] | PEN_INJECTOR | Freq: Every day | SUBCUTANEOUS | Status: DC

## 2015-10-25 NOTE — Progress Notes (Signed)
Subjective:  Patient ID: Margaret Austin, female    DOB: July 25, 1956  Age: 60 y.o. MRN: [id number]  CC: Hyperglycemia   HPI Margaret Austin is a 60 year old Spanish speaking lady with a history of uncontrolled type 2 diabetes mellitus (A1c 12.1), GERD seen with the aid of the language line who was seen for the pharmacist today for a follow-up on her blood sugar and was found to be hyperglycemic with a CBG of 450 and ketonuria on UA. Patient received 20 units of Lantus, transferred to see me. On speaking with her she endorses noncompliance and has run out of her Lantus for some time now and has only been taking metformin. She complains of a 6 day history of sinus congestion, nasal congestion, sore throat, fever and retro-orbital pain and myalgias but has not used any medications over-the-counter.   Outpatient Prescriptions Prior to Visit  Medication Sig Dispense Refill  . azithromycin (ZITHROMAX Z-PAK) 250 MG tablet Take as directed 6 each 0  . Blood Glucose Monitoring Suppl (TRUE METRIX METER) w/Device KIT Used as directed 1 kit 0  . cetirizine (ZYRTEC) 10 MG tablet Take 1 tablet (10 mg total) by mouth daily. 30 tablet 11  . diclofenac (VOLTAREN) 75 MG EC tablet Take 1 tablet (75 mg total) by mouth 2 (two) times daily. 45 tablet 0  . diclofenac (VOLTAREN) 75 MG EC tablet Take 1 tablet (75 mg total) by mouth 2 (two) times daily. 30 tablet 2  . docusate sodium (RA COL-RITE) 100 MG capsule Take 100 mg by mouth daily.    . fluticasone (FLONASE) 50 MCG/ACT nasal spray Place 2 sprays into both nostrils daily. 16 g 3  . glucose blood (TRUE METRIX BLOOD GLUCOSE TEST) test strip Use as instructed 100 each 12  . Insulin Pen Needle (ULTICARE PEN NEEDLES) 29G X 12.7MM MISC 15 Units by Does not apply route at bedtime. 1 each 2  . metFORMIN (GLUCOPHAGE) 1000 MG tablet Take 1 tablet (1,000 mg total) by mouth 2 (two) times daily with a meal. 180 tablet 3  . miconazole (MICOTIN) 100 MG vaginal  suppository Place 1 suppository (100 mg total) vaginally at bedtime. 7 suppository 0  . mometasone (NASONEX) 50 MCG/ACT nasal spray Place 2 sprays into the nose daily. 17 g 12  . oxymetazoline (AFRIN NASAL SPRAY) 0.05 % nasal spray Place 1 spray into both nostrils 2 (two) times daily. 30 mL 0  . TRUEPLUS LANCETS 28G MISC Check blood sugar TID & QHS 100 each 12  . amoxicillin (AMOXIL) 500 MG capsule Take 1 capsule (500 mg total) by mouth 2 (two) times daily. 20 capsule 0  . Insulin Glargine (LANTUS) 100 UNIT/ML Solostar Pen Inject 18 Units into the skin daily at 10 pm. 15 mL 3  . oxyCODONE-acetaminophen (PERCOCET/ROXICET) 5-325 MG per tablet Take 1 tablet by mouth every 4 (four) hours as needed for severe pain. 20 tablet 0   No facility-administered medications prior to visit.    ROS Review of Systems  Constitutional: Positive for fever. Negative for activity change, appetite change and fatigue.  HENT: Positive for postnasal drip, sinus pressure, sore throat and voice change. Negative for congestion.   Eyes: Positive for pain. Negative for visual disturbance.  Respiratory: Negative for cough, chest tightness, shortness of breath and wheezing.   Cardiovascular: Negative for chest pain and palpitations.  Gastrointestinal: Negative for abdominal pain, constipation and abdominal distention.  Endocrine: Negative for polydipsia.  Genitourinary: Negative for dysuria and frequency.  Musculoskeletal: Negative  for back pain and arthralgias.  Skin: Negative for rash.  Neurological: Negative for tremors, light-headedness and numbness.  Hematological: Does not bruise/bleed easily.  Psychiatric/Behavioral: Negative for behavioral problems and agitation.    Objective:  BP 115/75 mmHg  Pulse 85  Temp(Src) 98.5 F (36.9 C)  Resp 15  Ht '5\' 1"'  (1.549 m)  Wt 176 lb (79.833 kg)  BMI 33.27 kg/m2  SpO2 98%  BP/Weight 10/25/2015 09/16/2015 32/07/2481  Systolic BP 500 370 98  Diastolic BP 75 84 57  Wt.  (Lbs) 176 178 -  BMI 33.27 33.65 -      Physical Exam  Constitutional: She is oriented to person, place, and time. She appears well-developed and well-nourished.  Cardiovascular: Normal rate, normal heart sounds and intact distal pulses.   No murmur heard. Pulmonary/Chest: Effort normal and breath sounds normal. She has no wheezes. She has no rales. She exhibits no tenderness.  Abdominal: Soft. Bowel sounds are normal. She exhibits no distension and no mass. There is no tenderness.  Musculoskeletal: Normal range of motion.  Neurological: She is alert and oriented to person, place, and time.   Lab Results  Component Value Date   HGBA1C 12.10 09/16/2015    CMP Latest Ref Rng 09/27/2014 09/04/2014 04/24/2014  Glucose 70 - 99 mg/dL 313(H) 336(H) 128(H)  BUN 6 - 23 mg/dL '10 11 11  ' Creatinine 0.50 - 1.10 mg/dL 0.48(L) 0.56 0.58  Sodium 135 - 145 mEq/L 135 133(L) 139  Potassium 3.5 - 5.3 mEq/L 3.5 4.0 4.2  Chloride 96 - 112 mEq/L 99 98 101  CO2 19 - 32 mEq/L '24 28 25  ' Calcium 8.4 - 10.5 mg/dL 8.8 8.7 9.1  Total Protein 6.0 - 8.3 g/dL 6.4 7.0 7.5  Total Bilirubin 0.2 - 1.2 mg/dL 0.9 1.1 2.0(H)  Alkaline Phos 39 - 117 U/L 136(H) 157(H) 99  AST 0 - 37 U/L '19 22 21  ' ALT 0 - 35 U/L 27 34 21      Assessment & Plan:   1. Type 2 diabetes mellitus with hyperglycemia, with long-term current use of insulin (HCC)  Raised dose of Lantus from 18 units to 25 units. Keep blood sugar logs with fasting goals of 80-120 mg/dl, random of less than 180 and in the event of sugars less than 60 mg/dl or greater than 400 mg/dl please notify the clinic ASAP. It is recommended that you undergo annual eye exams and annual foot exams. Pneumovax is recommended every 5 years before the age of 25 and once for a lifetime at or after the age of 80.  Insulin Glargine (LANTUS) 100 UNIT/ML Solostar Pen; Inject 25 Units into the skin daily at 10 pm.  Dispense: 15 mL; Refill: 3   2. Gastroesophageal reflux disease  without esophagitis Controlled Continue PPI   3. Acute maxillary sinusitis, recurrence not specified Treated - amoxicillin (AMOXIL) 500 MG capsule; Take 1 capsule (500 mg total) by mouth 3 (three) times daily.  Dispense: 30 capsule; Refill: 0  5. Hyperglycemia Due to being out of Lantus CBG 450 Novolog 20 units administered, patient observed for 45 minutes after oral hydration provided Repeat CBG 267 - Glucose (CBG)   Meds ordered this encounter  Medications  . Insulin Glargine (LANTUS) 100 UNIT/ML Solostar Pen    Sig: Inject 25 Units into the skin daily at 10 pm.    Dispense:  15 mL    Refill:  3    Discontinue previous dose  . amoxicillin (AMOXIL) 500 MG capsule  Sig: Take 1 capsule (500 mg total) by mouth 3 (three) times daily.    Dispense:  30 capsule    Refill:  0    Follow-up: Return in about 3 weeks (around 11/15/2015) for Follow-up: Diabetes mellitus.    This note has been created with Surveyor, quantity. Any transcriptional errors are unintentional.    Arnoldo Morale MD

## 2015-10-25 NOTE — Progress Notes (Signed)
Patient was here for diabetes check with the pharmacist.  Patient had CBG of 450 with ketones in urine Pharmacist gave 20 units of Novolog and placed patient on Dr. Johna Sheriff schedule

## 2015-10-25 NOTE — Progress Notes (Signed)
S:    Patient arrives in good spirits.  Presents for diabetes evaluation, education, and management at the request of Chari Manning, NP. Patient was referred on 09/16/15.  Patient was last seen by Primary Care Provider on 09/16/15. A virtual interpreter was used for the duration of the visit.  Patient reports adherence with medications.  Current diabetes medications include: Lantus 18 units daily and metformin 100 mg BID.  Patient denies hypoglycemic events.   Patient reported exercise habits: none   Patient reports nocturia.  Patient reports neuropathy. Patient denies visual changes. Patient reports self foot exams.    O:  Lab Results  Component Value Date   HGBA1C 12.10 09/16/2015   There were no vitals filed for this visit.  Home fasting CBG: 200s-400s 2 hour post-prandial/random CBG: 300s-400s   A/P: Diabetes currently uncontrolled based on A1c of 12.1 and home CBGs. Patient denies hypoglycemic events and is able to verbalize appropriate hypoglycemia management plan. Patient reports adherence with medication. Control is suboptimal due to dietary indiscretion and infection.  POCT glucose 450 and urine positive for ketones. Administered Novolog 20 units x 1. Patient appears to have a viral infection that is causing elevated blood glucose. Will work in to Dr. Johna Sheriff schedule for further work up.  Next A1C anticipated April 2017.    Written patient instructions provided.  Total time in face to face counseling 20 minutes.  Follow up Dr. Jarold Song today.

## 2015-10-25 NOTE — Patient Instructions (Signed)
La diabetes mellitus y los alimentos (Diabetes Mellitus and Food) Es importante que controle su nivel de azcar en la sangre (glucosa). El nivel de glucosa en sangre depende en gran medida de lo que usted come. Comer alimentos saludables en las cantidades [place] a lo largo del Training and development officer, aproximadamente a la misma hora [employer], lo ayudar a Chief Technology Officer su nivel de Multimedia programmer. Tambin puede ayudarlo a retrasar o Patent attorney de la diabetes mellitus. Comer de [employer] saludable incluso puede ayudarlo a Chartered loss adjuster de presin arterial y a Science writer o Theatre manager un peso saludable.  Entre las recomendaciones generales para alimentarse y Audiological scientist los alimentos de forma saludable, se incluyen las siguientes:  Respetar las comidas principales y comer colaciones con regularidad. Evitar pasar largos perodos sin comer con el fin de perder peso.  Seguir una dieta que consista principalmente en alimentos de origen vegetal, como frutas, vegetales, frutos secos, legumbres y cereales integrales.  Utilizar mtodos de coccin a baja temperatura, como hornear, en lugar de mtodos de coccin a alta temperatura, como frer en abundante aceite. Trabaje con el nutricionista para aprender a Financial planner nutricional de las etiquetas de los alimentos. CMO PUEDEN AFECTARME LOS ALIMENTOS? Carbohidratos Los carbohidratos afectan el nivel de glucosa en sangre ms que cualquier otro tipo de alimento. El nutricionista lo ayudar a Teacher, adult education cuntos carbohidratos puede consumir en cada comida y ensearle a contarlos. El recuento de carbohidratos es importante para mantener la glucosa en sangre en un nivel saludable, en especial si utiliza insulina o toma determinados medicamentos para la diabetes mellitus. Alcohol El alcohol puede provocar disminuciones sbitas de la glucosa en sangre (hipoglucemia), en especial si utiliza insulina o toma determinados medicamentos para la diabetes mellitus. La  hipoglucemia es una afeccin que puede poner en peligro la vida. Los sntomas de la hipoglucemia (somnolencia, mareos y Data processing manager) son similares a los sntomas de haber consumido mucho alcohol.  Si el mdico lo autoriza a beber alcohol, hgalo con moderacin y siga estas pautas:  Las mujeres no deben beber ms de un trago por da, y los hombres no deben beber ms de dos tragos por Training and development officer. Un trago es igual a:  12 onzas (355 ml) de cerveza  5 onzas de vino (150 ml) de vino  1,5onzas (23m) de bebidas espirituosas  No beba con el estmago vaco.  Mantngase hidratado. Beba agua, gaseosas dietticas o t helado sin azcar.  Las gaseosas comunes, los jugos y otros refrescos podran contener muchos carbohidratos y se dCivil Service fast streamer QU ALIMENTOS NO SE RECOMIENDAN? Cuando haga las elecciones de alimentos, es importante que recuerde que todos los alimentos son distintos. Algunos tienen menos nutrientes que otros por porcin, aunque podran tener la misma cantidad de caloras o carbohidratos. Es difcil darle al cuerpo lo que necesita cuando consume alimentos con menos nutrientes. Estos son algunos ejemplos de alimentos que debera evitar ya que contienen muchas caloras y carbohidratos, pero pocos nutrientes:  GPhysicist, medicaltrans (la mayora de los alimentos procesados incluyen grasas trans en la etiqueta de Informacin nutricional).  Gaseosas comunes.  Jugos.  Caramelos.  Dulces, como tortas, pasteles, rosquillas y [place]  Comidas fritas. QU ALIMENTOS PUEDO COMER? Consuma alimentos ricos en nutrientes, que nutrirn el cuerpo y lo mantendrn saludable. Los alimentos que debe comer tambin dependern de varios factores, como:  Las caloras que necesita.  Los medicamentos que toma.  Su peso.  El nivel de glucosa en sMarist College  El nArrow Rockde presin arterial.  El nivel de colesterol.  Debe consumir una amplia variedad de alimentos, por ejemplo:  Protenas.  Cortes de carne  magros.  Protenas con bajo contenido de grasas saturadas, como pescado, clara de huevo y frijoles. Evite las carnes procesadas.  Frutas y vegetales.  Frutas y vegetales que pueden ayudar a controlar los niveles sanguneos de glucosa, como manzanas, mangos y batatas.  Productos lcteos.  Elija productos lcteos sin grasa o con bajo contenido de grasa, como leche, yogur y queso.  Cereales, panes, pastas y arroz.  Elija cereales integrales, como panes multicereales, avena en grano y arroz integral. Estos alimentos pueden ayudar a controlar la presin arterial.  Grasas.  Alimentos que contengan grasas saludables, como frutos secos, aguacate, aceite de oliva, aceite de canola y pescado. TODOS LOS QUE PADECEN DIABETES MELLITUS TIENEN EL MISMO PLAN DE COMIDAS? Dado que todas las personas que padecen diabetes mellitus son distintas, no hay un solo plan de comidas que funcione para todos. Es muy importante que se rena con un nutricionista que lo ayudar a crear un plan de comidas adecuado para usted.   Esta informacin no tiene como fin reemplazar el consejo del mdico. Asegrese de hacerle al mdico cualquier pregunta que tenga.   Document Released: 11/13/2007 Document Revised: 08/27/2014 Elsevier Interactive Patient Education 2016 [employer].  

## 2015-11-15 ENCOUNTER — Encounter: Payer: Self-pay | Admitting: Internal Medicine

## 2015-11-15 ENCOUNTER — Ambulatory Visit: Payer: Self-pay | Attending: Internal Medicine | Admitting: Internal Medicine

## 2015-11-15 VITALS — BP 120/75 | HR 78 | Temp 98.0°F | Resp 16 | Ht 61.0 in | Wt 171.0 lb

## 2015-11-15 DIAGNOSIS — Z79899 Other long term (current) drug therapy: Secondary | ICD-10-CM | POA: Insufficient documentation

## 2015-11-15 DIAGNOSIS — R05 Cough: Secondary | ICD-10-CM | POA: Insufficient documentation

## 2015-11-15 DIAGNOSIS — Z794 Long term (current) use of insulin: Secondary | ICD-10-CM

## 2015-11-15 DIAGNOSIS — R0602 Shortness of breath: Secondary | ICD-10-CM | POA: Insufficient documentation

## 2015-11-15 DIAGNOSIS — E1165 Type 2 diabetes mellitus with hyperglycemia: Secondary | ICD-10-CM

## 2015-11-15 DIAGNOSIS — J309 Allergic rhinitis, unspecified: Secondary | ICD-10-CM

## 2015-11-15 LAB — POCT URINALYSIS DIPSTICK
Bilirubin, UA: NEGATIVE
Glucose, UA: 500
KETONES UA: NEGATIVE
Leukocytes, UA: NEGATIVE
Nitrite, UA: NEGATIVE
PROTEIN UA: NEGATIVE
RBC UA: NEGATIVE
SPEC GRAV UA: 1.01
UROBILINOGEN UA: 0.2
pH, UA: 6

## 2015-11-15 LAB — GLUCOSE, POCT (MANUAL RESULT ENTRY)
POC GLUCOSE: 419 mg/dL — AB (ref 70–99)
POC Glucose: 495 mg/dl — AB (ref 70–99)

## 2015-11-15 MED ORDER — MOMETASONE FUROATE 50 MCG/ACT NA SUSP: 2.0000 | Freq: Every day | NASAL | Status: DC

## 2015-11-15 MED ORDER — INSULIN ASPART 100 UNIT/ML ~~LOC~~ SOLN
20.0000 [IU] | Freq: Once | SUBCUTANEOUS | Status: AC
  Administered 2015-11-15: 20 [IU] via SUBCUTANEOUS

## 2015-11-15 MED ORDER — METFORMIN HCL 1000 MG PO TABS: 1000.0000 mg | ORAL_TABLET | Freq: Two times a day (BID) | ORAL | Status: DC

## 2015-11-15 MED ORDER — CETIRIZINE HCL 10 MG PO TABS: 10.0000 mg | ORAL_TABLET | Freq: Every day | ORAL | Status: DC

## 2015-11-15 NOTE — Progress Notes (Signed)
Patient ID: Margaret Austin, female   DOB: 1956/05/09, 60 y.o.   MRN: [id number] SUBJECTIVE: 60 y.o. female for follow up of diabetes. Patient presents today with a blood sugar of 495. She was seen 2 weeks ago and was found to be hyperglycemic due to not taking insulin in several weeks. Today she reports that she last used her Lantus last night at 10 pm.  She drank orange juice and ate a burrito earlier today. Patient states that she did not know how many units of insulin to take so she has only been taking 15 units instead of 25 units.  Patient concerned about left ear pain that has been present for almost two months. She states that she feels like her throat is swollen, right ear, cough, SOB, sneezing, and rhinitis.   Current Outpatient Prescriptions  Medication Sig Dispense Refill  . amoxicillin (AMOXIL) 500 MG capsule Take 1 capsule (500 mg total) by mouth 3 (three) times daily. 30 capsule 0  . Blood Glucose Monitoring Suppl (TRUE METRIX METER) w/Device KIT Used as directed 1 kit 0  . cetirizine (ZYRTEC) 10 MG tablet Take 1 tablet (10 mg total) by mouth daily. 30 tablet 11  . docusate sodium (RA COL-RITE) 100 MG capsule Take 100 mg by mouth daily.    Marland Kitchen glucose blood (TRUE METRIX BLOOD GLUCOSE TEST) test strip Use as instructed 100 each 12  . Insulin Glargine (LANTUS) 100 UNIT/ML Solostar Pen Inject 25 Units into the skin daily at 10 pm. 15 mL 3  . Insulin Pen Needle (ULTICARE PEN NEEDLES) 29G X 12.7MM MISC 15 Units by Does not apply route at bedtime. 1 each 2  . metFORMIN (GLUCOPHAGE) 1000 MG tablet Take 1 tablet (1,000 mg total) by mouth 2 (two) times daily with a meal. 180 tablet 3  . miconazole (MICOTIN) 100 MG vaginal suppository Place 1 suppository (100 mg total) vaginally at bedtime. 7 suppository 0  . TRUEPLUS LANCETS 28G MISC Check blood sugar TID & QHS 100 each 12  . azithromycin (ZITHROMAX Z-PAK) 250 MG tablet Take as directed (Patient not taking: Reported on 11/15/2015) 6 each 0  .  diclofenac (VOLTAREN) 75 MG EC tablet Take 1 tablet (75 mg total) by mouth 2 (two) times daily. 45 tablet 0  . fluticasone (FLONASE) 50 MCG/ACT nasal spray Place 2 sprays into both nostrils daily. (Patient not taking: Reported on 11/15/2015) 16 g 3  . mometasone (NASONEX) 50 MCG/ACT nasal spray Place 2 sprays into the nose daily. (Patient not taking: Reported on 11/15/2015) 17 g 12  . oxymetazoline (AFRIN NASAL SPRAY) 0.05 % nasal spray Place 1 spray into both nostrils 2 (two) times daily. (Patient not taking: Reported on 11/15/2015) 30 mL 0   Current Facility-Administered Medications  Medication Dose Route Frequency Provider Last Rate Last Dose  . insulin aspart (novoLOG) injection 20 Units  20 Units Subcutaneous Once Lance Bosch, NP      ROS: Other than what is stated in HPI, all other systems are negative.   OBJECTIVE: Appearance: alert, well appearing, and in no distress, oriented to person, place, and time and overweight. BP 120/75 mmHg  Pulse 78  Temp(Src) 98 F (36.7 C) (Oral)  Resp 16  Ht '5\' 1"'  (1.549 m)  Wt 171 lb (77.565 kg)  BMI 32.33 kg/m2  SpO2 98%  Exam: heart sounds normal rate, regular rhythm, normal S1, S2, no murmurs, rubs, clicks or gallops, no JVD, chest clear, no hepatosplenomegaly, no carotid bruits  ASSESSMENT: Margaret Austin was seen  today for follow-up and diabetes.  Diagnoses and all orders for this visit:  Type 2 diabetes mellitus with hyperglycemia, with long-term current use of insulin (HCC) -     Glucose (CBG) -     insulin aspart (novoLOG) injection 20 Units; Inject 0.2 mLs (20 Units total) into the skin once. -     Glucose (CBG) -     Urinalysis Dipstick -     metFORMIN (GLUCOPHAGE) 1000 MG tablet; Take 1 tablet (1,000 mg total) by mouth 2 (two) times daily with a meal. Orally hydrated in office and given insulin to lower sugars. Patient advised to stay hydrated today and take correct dose of Lantus (25 units). She will come back in 1 week for review of log.  If fasting sugars are still above 180 I will begin to slowly increase dose.  Allergic rhinitis, unspecified allergic rhinitis type -     Begin cetirizine (ZYRTEC) 10 MG tablet; Take 1 tablet (10 mg total) by mouth daily. -     Refilled mometasone (NASONEX) 50 MCG/ACT nasal spray; Place 2 sprays into the nose daily.    PLAN: See orders for this visit as documented in the electronic medical record. Issues reviewed with her: diabetic diet discussed in detail, written exchange diet given, low cholesterol diet, weight control and daily exercise discussed, all medications, side effects and compliance discussed carefully, annual eye examinations at Ophthalmology discussed and long term diabetic complications discussed.   Interpreter was used to communicate directly with patient for the entire encounter including providing detailed patient instructions.   Return in about 1 week (around 11/22/2015) for Stacy log review and 3 mo PCP .  Lance Bosch, NP 11/19/2015 3:17 PM

## 2015-11-15 NOTE — Progress Notes (Signed)
Patient's here for f/up diabetes.  Patient reports having pain in R ear x3 months now.   Patient reports being on ear drops, but since ran out and still having ear pain.  Patient rates pain 9/10.

## 2015-11-23 ENCOUNTER — Ambulatory Visit: Payer: Self-pay | Attending: Internal Medicine | Admitting: Pharmacist

## 2015-11-23 ENCOUNTER — Ambulatory Visit (HOSPITAL_BASED_OUTPATIENT_CLINIC_OR_DEPARTMENT_OTHER): Payer: Self-pay | Admitting: Internal Medicine

## 2015-11-23 ENCOUNTER — Encounter: Payer: Self-pay | Admitting: Pharmacist

## 2015-11-23 ENCOUNTER — Encounter: Payer: Self-pay | Admitting: Internal Medicine

## 2015-11-23 VITALS — BP 108/71 | HR 68

## 2015-11-23 VITALS — BP 114/74 | HR 62 | Temp 98.1°F | Resp 16 | Ht 61.0 in | Wt 171.6 lb

## 2015-11-23 DIAGNOSIS — E119 Type 2 diabetes mellitus without complications: Secondary | ICD-10-CM | POA: Insufficient documentation

## 2015-11-23 DIAGNOSIS — Z794 Long term (current) use of insulin: Secondary | ICD-10-CM

## 2015-11-23 DIAGNOSIS — E1165 Type 2 diabetes mellitus with hyperglycemia: Secondary | ICD-10-CM

## 2015-11-23 DIAGNOSIS — H53133 Sudden visual loss, bilateral: Secondary | ICD-10-CM

## 2015-11-23 LAB — GLUCOSE, POCT (MANUAL RESULT ENTRY)
POC Glucose: 114 mg/dl — AB (ref 70–99)
POC Glucose: 97 mg/dl (ref 70–99)

## 2015-11-23 LAB — POCT GLYCOSYLATED HEMOGLOBIN (HGB A1C): Hemoglobin A1C: 13

## 2015-11-23 NOTE — Progress Notes (Signed)
Patient ID: Margaret Austin, female   DOB: 05-Jun-1956, 60 y.o.   MRN: [id number]  CC: loss of vision  HPI: Margaret Austin is a 60 y.o. female here today for a follow up visit.  Patient has past medical history of diabetes. Patient has been seen here several times in the past 3 weeks. She was last evaluated by me for insulin changes. Today she presents because for the past 5 days she has been having symptoms of severe blurred vision. Patient states that she feels like she is losing her vision. Snellen chart exam today revealed 20/100 bilaterally. Patient has not had her yearly diabetic eye exam. Vision in right eye is worse than left.   No Known Allergies Past Medical History  Diagnosis Date  . Diabetes mellitus without complication [facility])    Current Outpatient Prescriptions on File Prior to Visit  Medication Sig Dispense Refill  . amoxicillin (AMOXIL) 500 MG capsule Take 1 capsule (500 mg total) by mouth 3 (three) times daily. 30 capsule 0  . Blood Glucose Monitoring Suppl (TRUE METRIX METER) w/Device KIT Used as directed 1 kit 0  . cetirizine (ZYRTEC) 10 MG tablet Take 1 tablet (10 mg total) by mouth daily. 30 tablet 1  . glucose blood (TRUE METRIX BLOOD GLUCOSE TEST) test strip Use as instructed 100 each 12  . Insulin Glargine (LANTUS) 100 UNIT/ML Solostar Pen Inject 25 Units into the skin daily at 10 pm. 15 mL 3  . Insulin Pen Needle (ULTICARE PEN NEEDLES) 29G X 12.7MM MISC 15 Units by Does not apply route at bedtime. 1 each 2  . metFORMIN (GLUCOPHAGE) 1000 MG tablet Take 1 tablet (1,000 mg total) by mouth 2 (two) times daily with a meal. 180 tablet 3  . mometasone (NASONEX) 50 MCG/ACT nasal spray Place 2 sprays into the nose daily. 17 g 1  . TRUEPLUS LANCETS 28G MISC Check blood sugar TID & QHS 100 each 12  . azithromycin (ZITHROMAX Z-PAK) 250 MG tablet Take as directed (Patient not taking: Reported on 11/15/2015) 6 each 0  . diclofenac (VOLTAREN) 75 MG EC tablet Take 1 tablet (75 mg  total) by mouth 2 (two) times daily. (Patient not taking: Reported on 11/23/2015) 45 tablet 0  . docusate sodium (RA COL-RITE) 100 MG capsule Take 100 mg by mouth daily. Reported on 11/23/2015    . fluticasone (FLONASE) 50 MCG/ACT nasal spray Place 2 sprays into both nostrils daily. (Patient not taking: Reported on 11/15/2015) 16 g 3  . miconazole (MICOTIN) 100 MG vaginal suppository Place 1 suppository (100 mg total) vaginally at bedtime. (Patient not taking: Reported on 11/23/2015) 7 suppository 0  . oxymetazoline (AFRIN NASAL SPRAY) 0.05 % nasal spray Place 1 spray into both nostrils 2 (two) times daily. (Patient not taking: Reported on 11/15/2015) 30 mL 0   No current facility-administered medications on file prior to visit.   History reviewed. No pertinent family history. Social History   Social History  . Marital Status: Single    Spouse Name: N/A  . Number of Children: N/A  . Years of Education: N/A   Occupational History  . Not on file.   Social History Main Topics  . Smoking status: Never Smoker   . Smokeless tobacco: Not on file  . Alcohol Use: No  . Drug Use: No  . Sexual Activity: Not on file   Other Topics Concern  . Not on file   Social History Narrative   ** Merged History Encounter **  Review of Systems: Other than what is stated in HPI, all other systems are negative.   Objective:   Filed Vitals:   11/23/15 1505  BP: 114/74  Pulse: 62  Temp: 98.1 F (36.7 C)  Resp: 16    Physical Exam  HENT:  Left Ear: External ear normal.  Normal vessels of left eye. Unable to clearly visualize vessels of right eye on fundoscopic exam.  Cardiovascular: Normal rate, regular rhythm and normal heart sounds.   Pulmonary/Chest: Effort normal and breath sounds normal.     Lab Results  Component Value Date   WBC 10.7* 09/04/2014   HGB 13.1 09/04/2014   HCT 37.7 09/04/2014   MCV 86.3 09/04/2014   PLT 261 09/04/2014   Lab Results  Component Value Date    CREATININE 0.48* 09/27/2014   BUN 10 09/27/2014   NA 135 09/27/2014   K 3.5 09/27/2014   CL 99 09/27/2014   CO2 24 09/27/2014    Lab Results  Component Value Date   HGBA1C 13.0 11/23/2015   Lipid Panel     Component Value Date/Time   CHOL 144 09/15/2013 1515   TRIG 162* 09/15/2013 1515   HDL 31* 09/15/2013 1515   CHOLHDL 4.6 09/15/2013 1515   VLDL 32 09/15/2013 1515   LDLCALC 81 09/15/2013 1515       Assessment and plan:   [name] was seen today for loss of vision.  Diagnoses and all orders for this visit:  Acute loss of vision, bilateral -     Ambulatory referral to Ophthalmology I will place referral for Ophthalmologyy. I have also given her a list of low cost providers she may call to schedule a appointment with. I have discussed the seriousness of her condition and highly recommend she makes appointment ASAP.   Type 2 diabetes mellitus with hyperglycemia, with long-term current use of insulin (HCC) -     POCT A1C -     Glucose (CBG) Improving, will return in 1 week with PharmD for titration if insulin.  Interpreter was used to communicate directly with patient for the entire encounter including providing detailed patient instructions.   Return in about 1 week (around 11/30/2015) for [name] review and 3 mo PCP DM.       Lance Bosch, [facility] [phone] 11/23/2015, 4:18 PM

## 2015-11-23 NOTE — Progress Notes (Signed)
S:    Patient arrives in good spirits this afternoon.  Presents for diabetes evaluation, education, and management. A Spanish interpreter was used for the entirety of the visit Lennette Bihari [phone]).   Patient reports Diabetes was diagnosed in 2014.   Patient reports adherence with medications.  Current diabetes medications include: Metformin 1000 mg BID; Lantus 25 units HS  Patient denies hypoglycemic events.   Patient reports nocturia.  Patient reports neuropathy. Patient reports visual changes. Patient reports self foot exams.   Patient states that her vision has been very blurry for the past 5 days. She has a hard time seeing me while I'm standing 3 feet in front of her. She states that when she pushes up on her neck and "pushes up her tonsils" she can see again. But once she stops the vision returns to blurry. Patient also states she has a hard time reading before her vision became blurry and tries her best to copy the number from her meter to her BG log. Pt endorses that she has been working 12 hour days recently.   O:  Lab Results  Component Value Date   HGBA1C 12.10 09/16/2015   Filed Vitals:   11/23/15 1533  BP: 108/71  Pulse: 68    Home fasting CBG: 280-320  POCT glucose = 97  A/P: Diabetes longstanding currently uncontrolled with A1c of 12.1 and home fasting CBG typically in the low 300s. Patient denies hypoglycemic events and is able to verbalize appropriate hypoglycemia management plan. Patient reports adherence with medication. Control is suboptimal due to additional medication therapy needed and lifestyle. No medication changes were made at this time and will defer until after visit with Mateo Flow.  Patient was referred to Chari Manning NP this afternoon to examine her eyes.  Next A1C anticipated 12/15/15.    Written patient instructions provided.  Total time in face to face counseling 30 minutes.   Follow up with Chari Manning NP at 1500 this afternoon.   Patient seen with  Jamie Brookes, PharmD Candidate

## 2015-11-23 NOTE — Progress Notes (Signed)
Patient c/o loss of vision.   Patient reports that she noticed constant blurry vision x5 days.Patient denies any pain, but states eyes become teary. No d/c.  Patient requesting med refills.

## 2015-12-01 ENCOUNTER — Ambulatory Visit: Payer: Self-pay | Admitting: Pharmacist

## 2015-12-06 ENCOUNTER — Ambulatory Visit: Payer: Self-pay | Attending: Internal Medicine | Admitting: Pharmacist

## 2015-12-06 ENCOUNTER — Encounter: Payer: Self-pay | Admitting: Pharmacist

## 2015-12-06 DIAGNOSIS — R0981 Nasal congestion: Secondary | ICD-10-CM

## 2015-12-06 MED ORDER — FLUTICASONE PROPIONATE 50 MCG/ACT NA SUSP: 2.0000 | Freq: Every day | NASAL | Status: DC

## 2015-12-06 NOTE — Patient Instructions (Signed)
Thanks for coming to see me!  You are doing a great job!  Schedule an appointment to be established with a new primary care doctor  Hipoglucemia (Hypoglycemia) Un bajo nivel de azcar en la sangre (hipoglucemia) implica que es menor de lo que debera ser. Algunos signos de hipoglucemia son:  Transpiracin abundante.  Aumento del apetito.  Sensacin de [employer] o debilidad.  Sentir ms sueo que lo habitual.  Nerviosismo.  Dolor de [place].  Pulsaciones rpidas. El nivel de azcar en sangre puede descender rpidamente y puede ser Engineer, maintenance (IT). Su mdico puede hacerle estudios para Public house manager. Puede ser que tenga hipoglucemia y no sea diabtico. CUIDADOS EN EL HOGAR  Controle el nivel de azcar en la sangre como lo indique su mdico. Si es menor de 70 mg / dl , o el que le indique su mdico, tome una de las siguientes medidas:  3  4 tabletas de glucosa.   taza de jugo liviano.   taza de gaseosa que no sea diettica.  [place]  5  6 caramelos duros.  Vuelva a controlarse despus de 15 minutos. Repita hasta que el nivel sea el [place].  Tome una colacin si falta ms de 1 hora hasta la prxima comida.  Tome los medicamentos tal como se los prescribi el mdico.  No saltee las comidas. Coma siempre a la misma hora.  Debe evitar las bebidas alcohlicas, excepto con las comidas.  Controle su nivel de glucosa antes de conducir.  Controle su nivel de glucosa antes y despus de hacer actividad fsica.  Lleve siempre los medicamentos con usted,como por ejemplo las pastillas para la glucosa.  Siempre use un brazalete de alerta mdica si es diabtico. SOLICITE AYUDA DE INMEDIATO SI:   El nivel de glucosa desciende por debajo de 70 mg / dl o el que le indique su mdico, y:  Se siente confundido  No puede tragar.  Se desmaya.  No puede solucionarlo por sus propios medios. Puede ser necesario que alguien lo ayude.  Este problema le ocurre con  frecuencia.  Tiene algn problema con los medicamentos.  No se siente bien despus de 3  4 das.  Observa cambios en la visin. ASEGRESE DE QUE:   Comprende estas instrucciones.  Controlar la enfermedad.  Solicitar ayuda de inmediato si no mejora o empeora.   Esta informacin no tiene Marine scientist el consejo del mdico. Asegrese de hacerle al mdico cualquier pregunta que tenga.   Document Released: 09/08/2010 Document Revised: 08/27/2014 Elsevier Interactive Patient Education [employer].

## 2015-12-06 NOTE — Progress Notes (Addendum)
S:    Patient arrives in good spirits this afternoon.  Presents for diabetes evaluation, education, and management. A Spanish interpreter was used for the entirety of the visit Blane Ohara [phone]).   Patient reports Diabetes was diagnosed in 2014.   Patient reports adherence with medications.  Current diabetes medications include: Metformin 1000 mg BID; Lantus 25 units qHS  Patient denies hypoglycemic events.   Patient reports nocturia.  Patient reports neuropathy. Patient reports visual changes. Patient reports self foot exams.      O:  Lab Results  Component Value Date   HGBA1C 13.0 11/23/2015   There were no vitals filed for this visit.  Home fasting CBG: 124-168 Home random CBGs: 200s (<200 the last week)   7 day average = 147 14 day average 155 30 day average 186 - unlikely accurate as she was only checking fastings at this time  A/P: Diabetes longstanding currently uncontrolled with A1c of 12.1 but with improved readings based on home CBGs fasting and random. Patient denies hypoglycemic events and is able to verbalize appropriate hypoglycemia management plan. Patient reports adherence with medication. Control is suboptimal due to sedentary lifestyle and dietary indiscretion.  Continue metformin 1000 mg BID and Lantus 25 units daily. Her blood glucose readings are much better and almost to goal over the last week. Will have her follow up with new primary care provider and then can follow up with me as needed. Instructed her to return sooner if she starts seeing multiple readings in the 200s. Patient verbalized understanding  Next A1C anticipated July 2017   Written patient instructions provided.  Total time in face to face counseling 30 minutes.  Follow up with new primary care provider.

## 2016-01-09 ENCOUNTER — Ambulatory Visit: Payer: Self-pay | Attending: Family Medicine | Admitting: Family Medicine

## 2016-01-09 ENCOUNTER — Encounter: Payer: Self-pay | Admitting: Family Medicine

## 2016-01-09 VITALS — BP 112/70 | HR 69 | Temp 98.1°F | Resp 16 | Ht 61.0 in | Wt 175.2 lb

## 2016-01-09 DIAGNOSIS — M79604 Pain in right leg: Secondary | ICD-10-CM | POA: Insufficient documentation

## 2016-01-09 DIAGNOSIS — Z7951 Long term (current) use of inhaled steroids: Secondary | ICD-10-CM | POA: Insufficient documentation

## 2016-01-09 DIAGNOSIS — E1165 Type 2 diabetes mellitus with hyperglycemia: Secondary | ICD-10-CM | POA: Insufficient documentation

## 2016-01-09 DIAGNOSIS — Z7984 Long term (current) use of oral hypoglycemic drugs: Secondary | ICD-10-CM | POA: Insufficient documentation

## 2016-01-09 DIAGNOSIS — Z79899 Other long term (current) drug therapy: Secondary | ICD-10-CM | POA: Insufficient documentation

## 2016-01-09 DIAGNOSIS — Z794 Long term (current) use of insulin: Secondary | ICD-10-CM | POA: Insufficient documentation

## 2016-01-09 DIAGNOSIS — Z9119 Patient's noncompliance with other medical treatment and regimen: Secondary | ICD-10-CM | POA: Insufficient documentation

## 2016-01-09 DIAGNOSIS — M5441 Lumbago with sciatica, right side: Secondary | ICD-10-CM | POA: Insufficient documentation

## 2016-01-09 LAB — GLUCOSE, POCT (MANUAL RESULT ENTRY): POC GLUCOSE: 202 mg/dL — AB (ref 70–99)

## 2016-01-09 MED ORDER — TIZANIDINE HCL 4 MG PO TABS: 4.0000 mg | ORAL_TABLET | Freq: Four times a day (QID) | ORAL | Status: DC | PRN

## 2016-01-09 MED ORDER — INSULIN GLARGINE 100 UNIT/ML SOLOSTAR PEN: 25.0000 [IU] | PEN_INJECTOR | Freq: Every day | SUBCUTANEOUS | Status: DC

## 2016-01-09 MED ORDER — MELOXICAM 7.5 MG PO TABS: 7.5000 mg | ORAL_TABLET | Freq: Every day | ORAL | Status: DC

## 2016-01-09 NOTE — Progress Notes (Signed)
Patient's here c/o Left leg pain. Pain rated 10/10, described as throbbing that starts at the buttock to and radiates down her leg.  Patient is more intense when she sleeps.

## 2016-01-09 NOTE — Progress Notes (Signed)
Subjective:  Patient ID: Margaret Austin, female    DOB: Aug 09, 1956  Age: 60 y.o. MRN: [id number]  CC: Leg Pain   HPI Margaret Austin is a 60 year old female with a history of type 2 diabetes mellitus (A1c 13 from 11/2015) tenderness who comes into the clinic complaining of low back pain radiating down her right leg for the last 1 year.  She slipped on a slippery surface at her place of work and fell in 04/2014 with resulting low back pain and leg pain and last year slipped again and fell with resulting low back pain radiating to the right leg. At this time pain in the right leg as 10/10 and left this 8/10. 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 She informs me today she was referred here by her Attorney. Admits to tingling of her right lower extremity but no numbness, no leg weakness or recent falls, no loss of sphincteric function.  Outpatient Prescriptions Prior to Visit  Medication Sig Dispense Refill  . Blood Glucose Monitoring Suppl (TRUE METRIX METER) w/Device KIT Used as directed 1 kit 0  . glucose blood (TRUE METRIX BLOOD GLUCOSE TEST) test strip Use as instructed 100 each 12  . Insulin Pen Needle (ULTICARE PEN NEEDLES) 29G X 12.7MM MISC 15 Units by Does not apply route at bedtime. 1 each 2  . metFORMIN (GLUCOPHAGE) 1000 MG tablet Take 1 tablet (1,000 mg total) by mouth 2 (two) times daily with a meal. 180 tablet 3  . TRUEPLUS LANCETS 28G MISC Check blood sugar TID & QHS 100 each 12  . Insulin Glargine (LANTUS) 100 UNIT/ML Solostar Pen Inject 25 Units into the skin daily at 10 pm. 15 mL 3  . cetirizine (ZYRTEC) 10 MG tablet Take 1 tablet (10 mg total) by mouth daily. (Patient not taking: Reported on 01/09/2016) 30 tablet 1  . docusate sodium (RA COL-RITE) 100 MG capsule Take 100 mg by mouth daily. Reported on 01/09/2016    .  fluticasone (FLONASE) 50 MCG/ACT nasal spray Place 2 sprays into both nostrils daily. (Patient not taking: Reported on 01/09/2016) 16 g 2  . mometasone (NASONEX) 50 MCG/ACT nasal spray Place 2 sprays into the nose daily. (Patient not taking: Reported on 01/09/2016) 17 g 1   No facility-administered medications prior to visit.    ROS Review of Systems  Constitutional: Negative for activity change, appetite change and fatigue.  HENT: Negative for congestion, sinus pressure and sore throat.   Eyes: Negative for visual disturbance.  Respiratory: Negative for cough, chest tightness, shortness of breath and wheezing.   Cardiovascular: Negative for chest pain and palpitations.  Gastrointestinal: Negative for abdominal pain, constipation and abdominal distention.  Endocrine: Negative for polydipsia.  Genitourinary: Negative for dysuria and frequency.  Musculoskeletal:       See hpi  Skin: Negative for rash.  Neurological: Negative for tremors, light-headedness and numbness.  Hematological: Does not bruise/bleed easily.  Psychiatric/Behavioral: Negative for behavioral problems and agitation.    Objective:  BP 112/70 mmHg  Pulse 69  Temp(Src) 98.1 F (36.7 C) (Oral)  Resp 16  Ht _0  (1.549 m)  Wt 175 lb 3.2 oz (79.47 kg)  BMI 33.12 kg/m2  SpO2 99%  BP/Weight 01/09/2016 08/26/7937 0/10/90  Systolic BP 330 076 226  Diastolic BP 70 74 71  Wt. (Lbs)  175.2 171.6 -  BMI 33.12 32.44 -      Physical Exam  Constitutional: She is oriented to person, place, and time. She appears well-developed and well-nourished.  Cardiovascular: Normal rate, normal heart sounds and intact distal pulses.   No murmur heard. Pulmonary/Chest: Effort normal and breath sounds normal. She has no wheezes. She has no rales. She exhibits no tenderness.  Abdominal: Soft. Bowel sounds are normal. She exhibits no distension and no mass. There is no tenderness.  Musculoskeletal: Normal range of motion.  Tenderness to  palpation of  lumbar spine; positive straight leg raise on the right  Neurological: She is alert and oriented to person, place, and time.   Lab Results  Component Value Date   HGBA1C 13.0 11/23/2015     Assessment & Plan:   1. Type 2 diabetes mellitus with hyperglycemia, with long-term current use of insulin (HCC) Uncontrolled with A1c of 13 due to noncompliance Compliance emphasized - Glucose (CBG) - Insulin Glargine (LANTUS) 100 UNIT/ML Solostar Pen; Inject 25 Units into the skin daily at 10 pm.  Dispense: 15 mL; Refill: 3  2. Right-sided low back pain with right-sided sciatica Demonstrated stretching exercises. - tiZANidine (ZANAFLEX) 4 MG tablet; Take 1 tablet (4 mg total) by mouth every 6 (six) hours as needed for muscle spasms.  Dispense: 90 tablet; Refill: 2 - meloxicam (MOBIC) 7.5 MG tablet; Take 1 tablet (7.5 mg total) by mouth daily.  Dispense: 30 tablet; Refill: 2   Meds ordered this encounter  Medications  . Insulin Glargine (LANTUS) 100 UNIT/ML Solostar Pen    Sig: Inject 25 Units into the skin daily at 10 pm.    Dispense:  15 mL    Refill:  3    Discontinue previous dose  . tiZANidine (ZANAFLEX) 4 MG tablet    Sig: Take 1 tablet (4 mg total) by mouth every 6 (six) hours as needed for muscle spasms.    Dispense:  90 tablet    Refill:  2  . meloxicam (MOBIC) 7.5 MG tablet    Sig: Take 1 tablet (7.5 mg total) by mouth daily.    Dispense:  30 tablet    Refill:  2    Follow-up: Return in about 3 weeks (around 01/30/2016) for Follow-up of diabetes mellitus.   Arnoldo Morale MD

## 2016-01-09 NOTE — Patient Instructions (Signed)

## 2016-02-06 ENCOUNTER — Encounter: Payer: Self-pay | Admitting: Family Medicine

## 2016-02-06 ENCOUNTER — Ambulatory Visit: Payer: Self-pay | Attending: Family Medicine | Admitting: Family Medicine

## 2016-02-06 ENCOUNTER — Ambulatory Visit: Payer: Self-pay

## 2016-02-06 VITALS — BP 105/72 | HR 79 | Temp 97.8°F | Resp 18 | Ht 60.0 in | Wt 176.0 lb

## 2016-02-06 DIAGNOSIS — E669 Obesity, unspecified: Secondary | ICD-10-CM | POA: Insufficient documentation

## 2016-02-06 DIAGNOSIS — Z6834 Body mass index (BMI) 34.0-34.9, adult: Secondary | ICD-10-CM | POA: Insufficient documentation

## 2016-02-06 DIAGNOSIS — Z794 Long term (current) use of insulin: Secondary | ICD-10-CM

## 2016-02-06 DIAGNOSIS — E1165 Type 2 diabetes mellitus with hyperglycemia: Secondary | ICD-10-CM | POA: Insufficient documentation

## 2016-02-06 DIAGNOSIS — Z79899 Other long term (current) drug therapy: Secondary | ICD-10-CM | POA: Insufficient documentation

## 2016-02-06 DIAGNOSIS — Z7984 Long term (current) use of oral hypoglycemic drugs: Secondary | ICD-10-CM | POA: Insufficient documentation

## 2016-02-06 LAB — LIPID PANEL
CHOL/HDL RATIO: 5.2 ratio — AB (ref <=5.0)
Cholesterol: 217 mg/dL — ABNORMAL HIGH (ref 125–200)
HDL: 42 mg/dL — AB (ref >=46)
LDL Cholesterol: 133 mg/dL — ABNORMAL HIGH (ref <130)
TRIGLYCERIDES: 209 mg/dL — AB (ref <150)
VLDL: 42 mg/dL — ABNORMAL HIGH (ref <30)

## 2016-02-06 LAB — COMPLETE METABOLIC PANEL WITH GFR
ALK PHOS: 111 U/L (ref 33–130)
ALT: 26 U/L (ref 6–29)
AST: 19 U/L (ref 10–35)
Albumin: 4.2 g/dL (ref 3.6–5.1)
BILIRUBIN TOTAL: 1.1 mg/dL (ref 0.2–1.2)
BUN: 10 mg/dL (ref 7–25)
CHLORIDE: 101 mmol/L (ref 98–110)
CO2: 29 mmol/L (ref 20–31)
Calcium: 9.2 mg/dL (ref 8.6–10.4)
Creat: 0.48 mg/dL — ABNORMAL LOW (ref 0.50–1.05)
GFR, Est Non African American: >89 mL/min (ref >=60)
Glucose, Bld: 126 mg/dL — ABNORMAL HIGH (ref 65–99)
Potassium: 4.1 mmol/L (ref 3.5–5.3)
Sodium: 138 mmol/L (ref 135–146)
Total Protein: 7.3 g/dL (ref 6.1–8.1)

## 2016-02-06 LAB — GLUCOSE, POCT (MANUAL RESULT ENTRY): POC Glucose: 120 mg/dl — AB (ref 70–99)

## 2016-02-06 MED ORDER — GLUCOSE BLOOD VI STRP: ORAL_STRIP | Status: DC

## 2016-02-06 MED ORDER — METFORMIN HCL 1000 MG PO TABS: 1000.0000 mg | ORAL_TABLET | Freq: Two times a day (BID) | ORAL | Status: DC

## 2016-02-06 MED ORDER — TRUE METRIX METER W/DEVICE KIT: PACK | Status: DC

## 2016-02-06 MED ORDER — INSULIN GLARGINE 100 UNIT/ML SOLOSTAR PEN: 25.0000 [IU] | PEN_INJECTOR | Freq: Every day | SUBCUTANEOUS | Status: DC

## 2016-02-06 MED ORDER — TRUEPLUS LANCETS 28G MISC: Status: DC
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
## 2016-02-06 NOTE — Progress Notes (Signed)
Diabetes check Medication refills

## 2016-02-06 NOTE — Patient Instructions (Signed)
Diabetes Mellitus and Food It is important for you to manage your blood sugar (glucose) level. Your blood glucose level can be greatly affected by what you eat. Eating healthier foods in the appropriate amounts throughout the day at about the same time each day will help you control your blood glucose level. It can also help slow or prevent worsening of your diabetes mellitus. Healthy eating may even help you improve the level of your blood pressure and reach or maintain a healthy weight.  General recommendations for healthful eating and cooking habits include:  Eating meals and snacks regularly. Avoid going long periods of time without eating to lose weight.  Eating a diet that consists mainly of plant-based foods, such as fruits, vegetables, nuts, legumes, and whole grains.  Using low-heat cooking methods, such as baking, instead of high-heat cooking methods, such as deep frying. Work with your dietitian to make sure you understand how to use the Nutrition Facts information on food labels. HOW CAN FOOD AFFECT ME? Carbohydrates Carbohydrates affect your blood glucose level more than any other type of food. Your dietitian will help you determine how many carbohydrates to eat at each meal and teach you how to count carbohydrates. Counting carbohydrates is important to keep your blood glucose at a healthy level, especially if you are using insulin or taking certain medicines for diabetes mellitus. Alcohol Alcohol can cause sudden decreases in blood glucose (hypoglycemia), especially if you use insulin or take certain medicines for diabetes mellitus. Hypoglycemia can be a life-threatening condition. Symptoms of hypoglycemia (sleepiness, dizziness, and disorientation) are similar to symptoms of having too much alcohol.  If your health care provider has given you approval to drink alcohol, do so in moderation and use the following guidelines:  Women should not have more than one drink per day, and men  should not have more than two drinks per day. One drink is equal to:  12 oz of beer.  5 oz of wine.  1 oz of hard liquor.  Do not drink on an empty stomach.  Keep yourself hydrated. Have water, diet soda, or unsweetened iced tea.  Regular soda, juice, and other mixers might contain a lot of carbohydrates and should be counted. WHAT FOODS ARE NOT RECOMMENDED? As you make food choices, it is important to remember that all foods are not the same. Some foods have fewer nutrients per serving than other foods, even though they might have the same number of calories or carbohydrates. It is difficult to get your body what it needs when you eat foods with fewer nutrients. Examples of foods that you should avoid that are high in calories and carbohydrates but low in nutrients include:  Trans fats (most processed foods list trans fats on the Nutrition Facts label).  Regular soda.  Juice.  Candy.  Sweets, such as cake, pie, doughnuts, and cookies.  Fried foods. WHAT FOODS CAN I EAT? Eat nutrient-rich foods, which will nourish your body and keep you healthy. The food you should eat also will depend on several factors, including:  The calories you need.  The medicines you take.  Your weight.  Your blood glucose level.  Your blood pressure level.  Your cholesterol level. You should eat a variety of foods, including:  Protein.  Lean cuts of meat.  Proteins low in saturated fats, such as fish, egg whites, and beans. Avoid processed meats.  Fruits and vegetables.  Fruits and vegetables that may help control blood glucose levels, such as apples, mangoes, and   yams.  Dairy products.  Choose fat-free or low-fat dairy products, such as milk, yogurt, and cheese.  Grains, bread, pasta, and rice.  Choose whole grain products, such as multigrain bread, whole oats, and brown rice. These foods may help control blood pressure.  Fats.  Foods containing healthful fats, such as nuts,  avocado, olive oil, canola oil, and fish. DOES EVERYONE WITH DIABETES MELLITUS HAVE THE SAME MEAL PLAN? Because every person with diabetes mellitus is different, there is not one meal plan that works for everyone. It is very important that you meet with a dietitian who will help you create a meal plan that is just right for you.   This information is not intended to replace advice given to you by your health care provider. Make sure you discuss any questions you have with your health care provider.   Document Released: 05/03/2005 Document Revised: 08/27/2014 Document Reviewed: 07/03/2013 Elsevier Interactive Patient Education 2016 Elsevier Inc.  

## 2016-02-07 ENCOUNTER — Other Ambulatory Visit: Payer: Self-pay | Admitting: Family Medicine

## 2016-02-07 DIAGNOSIS — E785 Hyperlipidemia, unspecified: Secondary | ICD-10-CM | POA: Insufficient documentation

## 2016-02-07 LAB — MICROALBUMIN / CREATININE URINE RATIO
CREATININE, URINE: 143 mg/dL (ref 20–320)
MICROALB UR: 0.5 mg/dL
MICROALB/CREAT RATIO: 3 ug/mg{creat} (ref <30)

## 2016-02-07 MED ORDER — ATORVASTATIN CALCIUM 40 MG PO TABS: 40.0000 mg | ORAL_TABLET | Freq: Every day | ORAL | Status: DC

## 2016-02-08 ENCOUNTER — Telehealth: Payer: Self-pay

## 2016-02-08 NOTE — Telephone Encounter (Signed)
Its okay to leave voicemail. Patient returned nurses phone call

## 2016-02-08 NOTE — Telephone Encounter (Signed)
-----   Message from Arnoldo Morale, MD sent at 02/07/2016  9:13 AM EDT ----- Cholesterol is elevated and so I have sent a prescription for atorvastatin to her pharmacy.

## 2016-02-08 NOTE — Telephone Encounter (Signed)
Through Licensed conveyancer called patient.  A VM was left for patient to call back to get her lab results.

## 2016-02-09 NOTE — Telephone Encounter (Signed)
Through [place] [phone] patient was a called and with permission from the patient a VM was left stating that patient's cholesterol was elevated so MD sent a prescription for lipitor to the pharmacy here for patient to start taking one daily.  Call back number was provided to patient and she was encouraged to call back with any questions.

## 2016-03-16 ENCOUNTER — Ambulatory Visit: Payer: Self-pay | Attending: Family Medicine | Admitting: Family Medicine

## 2016-03-16 ENCOUNTER — Other Ambulatory Visit: Payer: Self-pay | Admitting: Internal Medicine

## 2016-03-16 ENCOUNTER — Encounter: Payer: Self-pay | Admitting: Family Medicine

## 2016-03-16 VITALS — BP 124/80 | HR 67 | Temp 97.7°F | Ht 61.0 in | Wt 172.8 lb

## 2016-03-16 DIAGNOSIS — Z0000 Encounter for general adult medical examination without abnormal findings: Secondary | ICD-10-CM

## 2016-03-16 DIAGNOSIS — Z794 Long term (current) use of insulin: Secondary | ICD-10-CM | POA: Insufficient documentation

## 2016-03-16 DIAGNOSIS — Z79899 Other long term (current) drug therapy: Secondary | ICD-10-CM | POA: Insufficient documentation

## 2016-03-16 DIAGNOSIS — J309 Allergic rhinitis, unspecified: Secondary | ICD-10-CM

## 2016-03-16 DIAGNOSIS — M79603 Pain in arm, unspecified: Secondary | ICD-10-CM | POA: Insufficient documentation

## 2016-03-16 DIAGNOSIS — M79629 Pain in unspecified upper arm: Secondary | ICD-10-CM

## 2016-03-16 DIAGNOSIS — Z7984 Long term (current) use of oral hypoglycemic drugs: Secondary | ICD-10-CM | POA: Insufficient documentation

## 2016-03-16 DIAGNOSIS — Z23 Encounter for immunization: Secondary | ICD-10-CM

## 2016-03-16 DIAGNOSIS — Z1211 Encounter for screening for malignant neoplasm of colon: Secondary | ICD-10-CM

## 2016-03-16 DIAGNOSIS — M25529 Pain in unspecified elbow: Secondary | ICD-10-CM

## 2016-03-16 DIAGNOSIS — Z124 Encounter for screening for malignant neoplasm of cervix: Secondary | ICD-10-CM

## 2016-03-16 DIAGNOSIS — Z1159 Encounter for screening for other viral diseases: Secondary | ICD-10-CM

## 2016-03-16 DIAGNOSIS — E119 Type 2 diabetes mellitus without complications: Secondary | ICD-10-CM | POA: Insufficient documentation

## 2016-03-16 DIAGNOSIS — Z1239 Encounter for other screening for malignant neoplasm of breast: Secondary | ICD-10-CM

## 2016-03-16 NOTE — Patient Instructions (Addendum)
Mantenimiento de la salud - Mujeres (Health Maintenance, Female) Un estilo de vida saludable y los cuidados preventivos pueden favorecer considerablemente a la salud y el bienestar. Pregunte a su mdico cul es el cronograma de exmenes peridicos apropiado para usted. Esta es una buena oportunidad para consultarlo sobre cmo prevenir enfermedades y mantenerse sano. Adems de los controles, hay muchas otras cosas que puede hacer usted mismo. Los expertos han realizado numerosas investigaciones sobre los cambios en el estilo de vida y las medidas de prevencin que, muy probablemente, lo ayudarn a mantenerse sano. Solicite a su mdico ms informacin. EL PESO Y LA DIETA  Consuma una dieta saludable.  Asegrese de incluir muchas verduras, frutas, productos lcteos de bajo contenido de grasa y protenas magras.  No consuma muchos alimentos de alto contenido de grasas slidas, azcares agregados o sal.  Realice actividad fsica con regularidad. Esta es una de las prcticas ms importantes que puede hacer por su salud.  La mayora de los adultos deben hacer ejercicio durante al menos 150minutos por semana. El ejercicio debe aumentar la frecuencia cardaca y provocar la transpiracin (ejercicio de intensidad moderada).  La mayora de los adultos tambin deben hacer ejercicios de elongacin al menos dos veces a la semana. Agregue esto al su plan de ejercicio de intensidad moderada. Mantenga un peso saludable.  El ndice de masa corporal (IMC) es una medida que puede utilizarse para identificar posibles problemas de peso. Proporciona una estimacin de la grasa corporal basndose en el peso y la altura. Su mdico puede ayudarle a determinar su IMC y a lograr o mantener un peso saludable.  Para las mujeres de 20aos o ms:  Un IMC menor de 18,5 se considera bajo peso.  Un IMC entre 18,5 y 24,9 es normal.  Un IMC entre 25 y 29,9 se considera sobrepeso.  Un IMC de 30 o ms se considera  obesidad. Observe los niveles de colesterol y lpidos en la sangre.  Debe comenzar a realizarse anlisis de lpidos y colesterol en la sangre a los 20aos y luego repetirlos cada 5aos.  Es posible que necesite controlar los niveles de colesterol con mayor frecuencia si:  Sus niveles de lpidos y colesterol son altos.  Es mayor de 50aos.  Presenta un alto riesgo de padecer enfermedades cardacas. DETECCIN DE CNCER  Cncer de pulmn  Se recomienda realizar exmenes de deteccin de cncer de pulmn a personas adultas entre 55 y 80 aos que estn en riesgo de desarrollar cncer de pulmn por sus antecedentes de consumo de tabaco.  Se recomienda una tomografa computarizada de baja dosis de los pulmones todos los aos a las personas que:  Fuman actualmente.  Hayan dejado el hbito en algn momento en los ltimos 15aos.  Hayan fumado durante 30aos un paquete diario. Un paquete-ao equivale a fumar un promedio de un paquete de cigarrillos diario durante un ao.  Los exmenes de deteccin anuales deben continuar hasta que hayan pasado 15aos desde que dej de fumar.  Ya no debern realizarse si tiene un problema de salud que le impida recibir tratamiento para el cncer de pulmn. Cncer de mama  Practique la autoconciencia de la mama. Esto significa reconocer la apariencia normal de sus mamas y cmo las siente.  Tambin significa realizar autoexmenes regulares de las mamas. Informe a su mdico sobre cualquier cambio, sin importar cun pequeo sea.  Si tiene entre 20 y 30 aos, un mdico debe realizarle un examen clnico de las mamas como parte del examen regular de salud, cada   1 a 3aos.  Si tiene 40aos o ms, debe realizarse un examen clnico de las mamas todos los aos. Tambin considere realizarse una radiografa de las mamas (mamografa) todos los aos.  Si tiene antecedentes familiares de cncer de mama, hable con su mdico para someterse a un estudio  gentico.  Si tiene alto riesgo de padecer cncer de mama, hable con su mdico para someterse a una resonancia magntica y una mamografa todos los aos.  La evaluacin del gen del cncer de mama (BRCA) se recomienda a mujeres que tengan familiares con cnceres relacionados con el BRCA. Los cnceres relacionados con el BRCA incluyen los siguientes:  Mama.  Ovario.  Trompas.  Cnceres de peritoneo.  Los resultados de la evaluacin determinarn la necesidad de asesoramiento gentico y de anlisis de BRCA1 y BRCA2. Cncer de cuello del tero El mdico puede recomendarle que se haga pruebas peridicas de deteccin de cncer de los rganos de la pelvis (ovarios, tero y vagina). Estas pruebas incluyen un examen plvico, que abarca controlar si se produjeron cambios microscpicos en la superficie del cuello del tero (prueba de Papanicolaou). Pueden recomendarle que se haga estas pruebas cada 3aos, a partir de los 21aos.  A las mujeres que tienen entre 30 y 65aos, los mdicos pueden recomendarles que se sometan a exmenes plvicos y pruebas de Papanicolaou cada 3aos, o a la prueba de Papanicolaou y el examen plvico en combinacin con estudios de deteccin del virus del papiloma humano (VPH) cada 5aos. Algunos tipos de VPH aumentan el riesgo de padecer cncer de cuello del tero. La prueba para la deteccin del VPH tambin puede realizarse a mujeres de cualquier edad cuyos resultados de la prueba de Papanicolaou no sean claros.  Es posible que otros mdicos no recomienden exmenes de deteccin a mujeres no embarazadas que se consideran sujetos de bajo riesgo de padecer cncer de pelvis y que no tienen sntomas. Pregntele al mdico si un examen plvico de deteccin es adecuado para usted.  Si ha recibido un tratamiento para el cncer cervical o una enfermedad que podra causar cncer, necesitar realizarse una prueba de Papanicolaou y controles durante al menos 20 aos de concluido el  tratamiento. Si no se ha hecho el Papanicolaou con regularidad, debern volver a evaluarse los factores de riesgo (como tener un nuevo compaero sexual), para determinar si debe realizarse los estudios nuevamente. Algunas mujeres sufren problemas mdicos que aumentan la probabilidad de contraer cncer de cuello del tero. En estos casos, el mdico podr indicar que se realicen controles y pruebas de Papanicolaou con ms frecuencia. Cncer colorrectal  Este tipo de cncer puede detectarse y a menudo prevenirse.  Por lo general, los estudios de rutina se deben comenzar a hacer a partir de los 50 aos y hasta los 75 aos.  Sin embargo, el mdico podr aconsejarle que lo haga antes, si tiene factores de riesgo para el cncer de colon.  Tambin puede recomendarle que use un kit de prueba para hallar sangre oculta en la materia fecal.  Es posible que se use una pequea cmara en el extremo de un tubo para examinar directamente el colon (sigmoidoscopia o colonoscopia) a fin de detectar formas tempranas de cncer colorrectal.  Los exmenes de rutina generalmente comienzan a los 50aos.  El examen directo del colon se debe repetir cada 5 a 10aos hasta los 75aos. Sin embargo, es posible que se realicen exmenes con mayor frecuencia, si se detectan formas tempranas de plipos precancerosos o pequeos bultos. Cncer de piel  Revise   la piel de la cabeza a los pies con regularidad.  Informe a su mdico si aparecen nuevos lunares o los que tiene se modifican, especialmente en su forma y color.  Tambin notifique al mdico si tiene un lunar que es ms grande que el tamao de una goma de lpiz.  Siempre use pantalla solar. Aplique pantalla solar de Kerry Dory y repetida a lo largo del Training and development officer.  Protjase usando mangas y [employer], un sombrero de ala ancha y gafas para el sol, siempre que se encuentre en el exterior. ENFERMEDADES CARDACAS, DIABETES E HIPERTENSIN ARTERIAL   La hipertensin  arterial causa enfermedades cardacas y [place] el riesgo de ictus. La hipertensin arterial es ms probable en los siguientes casos:  Las personas que tienen la presin arterial en el extremo del rango normal (100-139/85-89 mm Hg).  Las personas con sobrepeso u obesidad.  Las Retail banker.  Si usted tiene entre 18 y 39 aos, debe medirse la presin arterial cada 3 a 5 aos. Si usted tiene 40 aos o ms, debe medirse la presin arterial [employer]. Debe medirse la presin arterial dos veces: una vez cuando est en un hospital o una clnica y la otra vez cuando est en otro sitio. Registre el promedio de [employer]. Para controlar su presin arterial cuando no est en un hospital o [place], puede usar lo siguiente:  Ardelia Mems mquina automtica para medir la presin arterial en una farmacia.  Un monitor para medir la presin arterial en el hogar.  Si tiene entre 33 y 105 aos, consulte a su mdico si debe tomar aspirina para prevenir el ictus.  Realcese exmenes de deteccin de la diabetes con regularidad. Esto incluye la toma de [place] de sangre para controlar el nivel de azcar en la sangre durante el [place].  Si tiene un peso normal y un bajo riesgo de padecer diabetes, realcese este anlisis cada tres aos despus de los 45aos.  Si tiene sobrepeso y un alto riesgo de padecer diabetes, considere someterse a este anlisis antes o con mayor frecuencia. PREVENCIN DE INFECCIONES  HepatitisB  Si tiene un riesgo ms alto de Museum/gallery curator hepatitis B, debe someterse a un examen de deteccin de este virus. Se considera que tiene un alto riesgo de Museum/gallery curator hepatitis B si:  Naci en un pas donde la hepatitis B es frecuente. Pregntele a su mdico qu pases son considerados de Public affairs consultant.  Sus padres nacieron en un pas de alto riesgo y usted no recibi una vacuna que lo proteja contra la hepatitis B (vacuna contra la hepatitis B).  [place] agujas  para inyectarse drogas.  Vive con alguien que tiene hepatitis B.  Ha tenido sexo con alguien que tiene hepatitis B.  Recibe tratamiento de hemodilisis.  Toma ciertos medicamentos para el cncer, trasplante de rganos y afecciones autoinmunitarias. Hepatitis C  Se recomienda un anlisis de [place] para:  Todos los que nacieron entre 1943 y [id number].  Todas las personas que tengan un riesgo de haber contrado hepatitis C. Enfermedades de transmisin sexual (ETS).  Debe realizarse pruebas de deteccin de enfermedades de transmisin sexual (ETS), incluidas gonorrea y clamidia si:  Es sexualmente activo y es menor de 24aos.  Es mayor de 24aos, y Investment banker, operational informa que corre riesgo de tener este tipo de infecciones.  La actividad sexual ha cambiado desde que le hicieron la ltima prueba de deteccin y tiene un riesgo mayor de Best boy clamidia o Radio broadcast assistant. Pregntele  al mdico si usted tiene riesgo.  Si no tiene el VIH, pero corre riesgo de infectarse por el virus, se recomienda tomar diariamente un medicamento recetado para evitar la infeccin. Esto se conoce como profilaxis previa a la exposicin. Se considera que est en riesgo si:  Es activo sexualmente y no usa preservativos habitualmente o no conoce el estado del VIH de sus parejas sexuales.  Se inyecta drogas.  Es activo sexualmente con una pareja que tiene VIH. Consulte a su mdico para saber si tiene un alto riesgo de infectarse por el VIH. Si opta por comenzar la profilaxis previa a la exposicin, primero debe realizarse anlisis de deteccin del VIH. Luego, le harn anlisis cada 3meses mientras est tomando los medicamentos para la profilaxis previa a la exposicin.  EMBARAZO   Si es premenopusica y puede quedar embarazada, solicite a su mdico asesoramiento previo a la concepcin.  Si puede quedar embarazada, tome 400 a 800microgramos (mcg) de cido flico todos los das.  Si desea evitar el embarazo, hable con su  mdico sobre el control de la natalidad (anticoncepcin). OSTEOPOROSIS Y MENOPAUSIA   La osteoporosis es una enfermedad en la que los huesos pierden los minerales y la fuerza por el avance de la edad. El resultado pueden ser fracturas graves en los huesos. El riesgo de osteoporosis puede identificarse con una prueba de densidad sea.  Si tiene 65aos o ms, o si est en riesgo de sufrir osteoporosis y fracturas, pregunte a su mdico si debe someterse a exmenes.  Consulte a su mdico si debe tomar un suplemento de calcio o de vitamina D para reducir el riesgo de osteoporosis.  La menopausia puede presentar ciertos sntomas fsicos y riesgos.  La terapia de reemplazo hormonal puede reducir algunos de estos sntomas y riesgos. Consulte a su mdico para saber si la terapia de reemplazo hormonal es conveniente para usted.  INSTRUCCIONES PARA EL CUIDADO EN EL HOGAR   Realcese los estudios de rutina de la salud, dentales y de la vista.  Mantngase al da con las vacunas.  No consuma ningn producto que contenga tabaco, lo que incluye cigarrillos, tabaco de mascar o cigarrillos electrnicos.  Si est embarazada, no beba alcohol.  Si est amamantando, reduzca el consumo de alcohol y la frecuencia con la que consume.  Si es mujer y no est embarazada limite el consumo de alcohol a no ms de 1 medida por da. Una medida equivale a 12onzas de cerveza, 5onzas de vino o 1onzas de bebidas alcohlicas de alta graduacin.  No consuma drogas.  No comparta agujas.  Solicite ayuda a su mdico si necesita apoyo o informacin para abandonar las drogas.  Informe a su mdico si a menudo se siente deprimido.  Notifique a su mdico si alguna vez ha sido vctima de abuso o si no se siente seguro en su hogar.   Esta informacin no tiene como fin reemplazar el consejo del mdico. Asegrese de hacerle al mdico cualquier pregunta que tenga.   Document Released: 07/26/2011 Document Revised:  08/27/2014 Elsevier Interactive Patient Education 2016 Elsevier Inc.  

## 2016-03-16 NOTE — Progress Notes (Signed)
Bilateral arm pain Medication refills

## 2016-03-16 NOTE — Progress Notes (Signed)
Subjective:  Patient ID: Margaret Austin, female    DOB: 01/14/1956  Age: 60 y.o. MRN: [id number]  CC: Annual Exam and Diabetes   HPI Margaret Austin presents for A complete physical exam.  Outpatient Medications Prior to Visit  Medication Sig Dispense Refill  . atorvastatin (LIPITOR) 40 MG tablet Take 1 tablet (40 mg total) by mouth daily. 30 tablet 3  . Blood Glucose Monitoring Suppl (TRUE METRIX METER) w/Device KIT Used as directed, 3 times daily. 1 kit 0  . cetirizine (ZYRTEC) 10 MG tablet Take 1 tablet (10 mg total) by mouth daily. 30 tablet 1  . glucose blood (TRUE METRIX BLOOD GLUCOSE TEST) test strip Use as instructed, 3 times daily 100 each 12  . Insulin Glargine (LANTUS) 100 UNIT/ML Solostar Pen Inject 25 Units into the skin daily at 10 pm. 15 mL 3  . Insulin Pen Needle (ULTICARE PEN NEEDLES) 29G X 12.7MM MISC 15 Units by Does not apply route at bedtime. 1 each 2  . meloxicam (MOBIC) 7.5 MG tablet Take 1 tablet (7.5 mg total) by mouth daily. 30 tablet 2  . metFORMIN (GLUCOPHAGE) 1000 MG tablet Take 1 tablet (1,000 mg total) by mouth 2 (two) times daily with a meal. 180 tablet 3  . TRUEPLUS LANCETS 28G MISC Check blood sugar TID 100 each 12  . docusate sodium (RA COL-RITE) 100 MG capsule Take 100 mg by mouth daily. Reported on 01/09/2016    . fluticasone (FLONASE) 50 MCG/ACT nasal spray Place 2 sprays into both nostrils daily. (Patient not taking: Reported on 03/16/2016) 16 g 2  . mometasone (NASONEX) 50 MCG/ACT nasal spray Place 2 sprays into the nose daily. (Patient not taking: Reported on 02/06/2016) 17 g 1  . tiZANidine (ZANAFLEX) 4 MG tablet Take 1 tablet (4 mg total) by mouth every 6 (six) hours as needed for muscle spasms. (Patient not taking: Reported on 03/16/2016) 90 tablet 2   No facility-administered medications prior to visit.     ROS Review of Systems  Constitutional: Negative for activity change, appetite change and fatigue.  HENT: Negative for congestion,  sinus pressure and sore throat.   Eyes: Negative for visual disturbance.  Respiratory: Negative for cough, chest tightness, shortness of breath and wheezing.   Cardiovascular: Negative for chest pain and palpitations.  Gastrointestinal: Negative for abdominal distention, abdominal pain and constipation.  Endocrine: Negative for polydipsia.  Genitourinary: Negative for dysuria and frequency.  Musculoskeletal: Negative for arthralgias and back pain.       Bilateral arm pain worse at night.  Skin: Negative for rash.  Neurological: Negative for tremors, light-headedness and numbness.  Hematological: Does not bruise/bleed easily.  Psychiatric/Behavioral: Negative for agitation and behavioral problems.    Objective:  BP 124/80 (BP Location: Right Arm, Patient Position: Sitting, Cuff Size: Small)   Pulse 67   Temp 97.7 F (36.5 C) (Oral)   Ht '5\' 1"'  (1.549 m)   Wt 172 lb 12.8 oz (78.4 kg)   SpO2 99%   BMI 32.65 kg/m   BP/Weight 03/16/2016 02/06/2016 4/69/6295  Systolic BP 284 132 440  Diastolic BP 80 72 70  Wt. (Lbs) 172.8 176 175.2  BMI 32.65 34.37 33.12      Physical Exam  Constitutional: She is oriented to person, place, and time. She appears well-developed and well-nourished. No distress.  HENT:  Head: Normocephalic.  Right Ear: External ear normal.  Left Ear: External ear normal.  Nose: Nose normal.  Mouth/Throat: Oropharynx is clear and moist.  Eyes:  Conjunctivae and EOM are normal. Pupils are equal, round, and reactive to light.  Neck: Normal range of motion. No JVD present.  Cardiovascular: Normal rate, regular rhythm, normal heart sounds and intact distal pulses.  Exam reveals no gallop.   No murmur heard. Pulmonary/Chest: Effort normal and breath sounds normal. No respiratory distress. She has no wheezes. She has no rales. She exhibits no tenderness. Right breast exhibits no mass, no nipple discharge and no skin change. Left breast exhibits no mass, no nipple discharge  and no skin change.  Abdominal: Soft. Bowel sounds are normal. She exhibits no distension and no mass. There is no tenderness.  Genitourinary:  Genitourinary Comments: Normal external genitalia Normal vagina Normal cervix and adnexa  Musculoskeletal: Normal range of motion. She exhibits no edema or tenderness.  Neurological: She is alert and oriented to person, place, and time. She has normal reflexes.  Skin: Skin is warm and dry. She is not diaphoretic.  Psychiatric: She has a normal mood and affect.     Assessment & Plan:   1. Routine general medical examination at a health care facility  2. Need for DTaP vaccine - Tdap vaccine greater than or equal to 7yo IM  3. Cervical cancer screening - Cytology - PAP ([place])  4. Special screening for malignant neoplasms, colon - Ambulatory referral to Gastroenterology  5. Screening for breast cancer - Mammogram Digital Screening; Future  6. Encounter for screening for other viral diseases - Hepatitis C antibody screen - HIV antibody  7.Arm pain Advised to use Aleve No orders of the defined types were placed in this encounter.   Follow-up: Return in 3 months (on 06/16/2016) for Follow-up of diabetes mellitus.   Arnoldo Morale MD

## 2016-03-16 NOTE — Telephone Encounter (Signed)
Rx Request 

## 2016-03-17 LAB — HEPATITIS C ANTIBODY: HCV Ab: NEGATIVE

## 2016-03-17 LAB — HIV ANTIBODY (ROUTINE TESTING W REFLEX): HIV: NONREACTIVE

## 2016-03-19 LAB — CYTOLOGY - PAP

## 2016-03-21 ENCOUNTER — Telehealth: Payer: Self-pay

## 2016-03-21 NOTE — Telephone Encounter (Signed)
[place] Id# [id number] Contacted pt to go over lab results pt is aware of results and doesn't have any questions or concerns

## 2016-03-26 ENCOUNTER — Telehealth: Payer: Self-pay

## 2016-03-26 NOTE — Telephone Encounter (Signed)
Through [place] patient was informed that all her lab tests came back normal.  Patient stated understanding.

## 2016-03-26 NOTE — Telephone Encounter (Signed)
-----   Message from Arnoldo Morale, MD sent at 03/20/2016  1:35 PM EDT ----- Please inform the patient that labs are normal. Thank you.

## 2016-04-18 ENCOUNTER — Other Ambulatory Visit: Payer: Self-pay | Admitting: Family Medicine

## 2016-04-18 DIAGNOSIS — Z1231 Encounter for screening mammogram for malignant neoplasm of breast: Secondary | ICD-10-CM

## 2016-04-25 ENCOUNTER — Ambulatory Visit
Admission: RE | Admit: 2016-04-25 | Discharge: 2016-04-25 | Disposition: A | Discharge status: Home / Self Care | Payer: No Typology Code available for payment source | Source: Ambulatory Visit | Attending: Family Medicine | Admitting: Family Medicine

## 2016-04-25 DIAGNOSIS — Z1231 Encounter for screening mammogram for malignant neoplasm of breast: Secondary | ICD-10-CM

## 2016-05-02 ENCOUNTER — Telehealth: Payer: Self-pay

## 2016-05-02 NOTE — Telephone Encounter (Signed)
-----   Message from Arnoldo Morale, MD sent at 04/26/2016  2:05 PM EDT ----- Mammogram is negative for malignancy

## 2016-05-02 NOTE — Telephone Encounter (Signed)
Writer called patient through [place] # [phone] from [facility] and was able to inform patient that she had a normal mammogram.  Patient stated understanding.

## 2016-06-20 ENCOUNTER — Other Ambulatory Visit: Payer: Self-pay | Admitting: Family Medicine

## 2016-06-20 DIAGNOSIS — M5441 Lumbago with sciatica, right side: Secondary | ICD-10-CM

## 2016-10-24 ENCOUNTER — Telehealth: Payer: Self-pay | Admitting: Family Medicine

## 2016-10-24 DIAGNOSIS — Z794 Long term (current) use of insulin: Secondary | ICD-10-CM

## 2016-10-24 DIAGNOSIS — E1165 Type 2 diabetes mellitus with hyperglycemia: Secondary | ICD-10-CM

## 2016-10-24 DIAGNOSIS — M5441 Lumbago with sciatica, right side: Secondary | ICD-10-CM

## 2016-10-24 MED ORDER — TIZANIDINE HCL 4 MG PO TABS
ORAL_TABLET | ORAL | 0 refills | Status: DC
Start: 2016-10-24 — End: 2016-12-07

## 2016-10-24 MED ORDER — METFORMIN HCL 1000 MG PO TABS: 1000.0000 mg | ORAL_TABLET | Freq: Two times a day (BID) | ORAL | 0 refills | Status: DC

## 2016-10-24 NOTE — Telephone Encounter (Signed)
Refilled x 30 days - patient must have office visit for further refills.

## 2016-10-24 NOTE — Telephone Encounter (Signed)
Patient called the office to request medication refill for tiZANidine (ZANAFLEX) 4 MG tablet and metFORMIN (GLUCOPHAGE) 1000 MG tablet. Please call it in to our pharmacy.   Thank you.

## 2016-11-10 IMAGING — CR DG CHEST 2V
2 series · 2 of 2 positions shown · non-contrast
Comparison: 04/24/2014

CLINICAL DATA: Nasal congestion, sore throat, bilateral ear pain
for 4 days.

EXAM:
CHEST  2 VIEW

[chest pa]
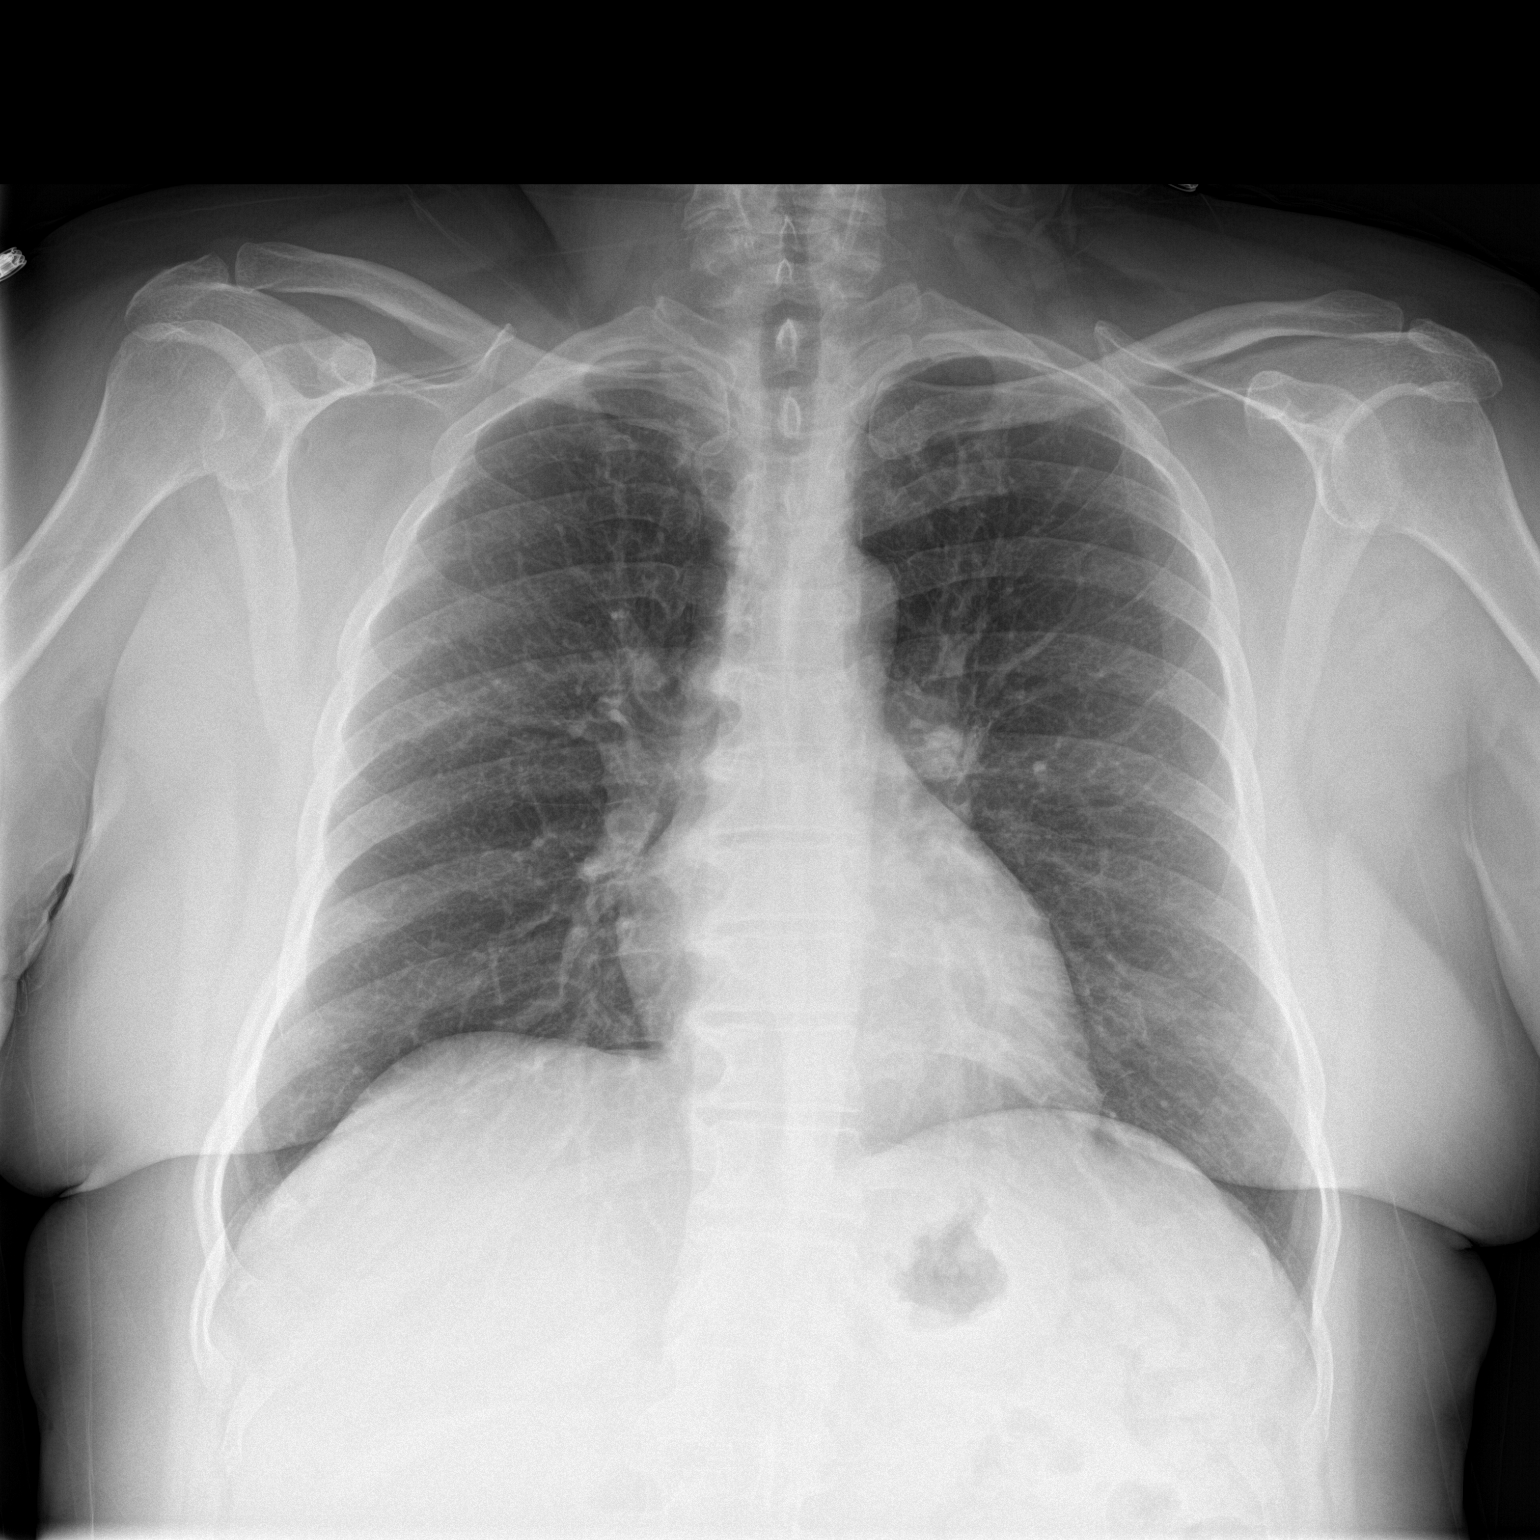

[chest lat]
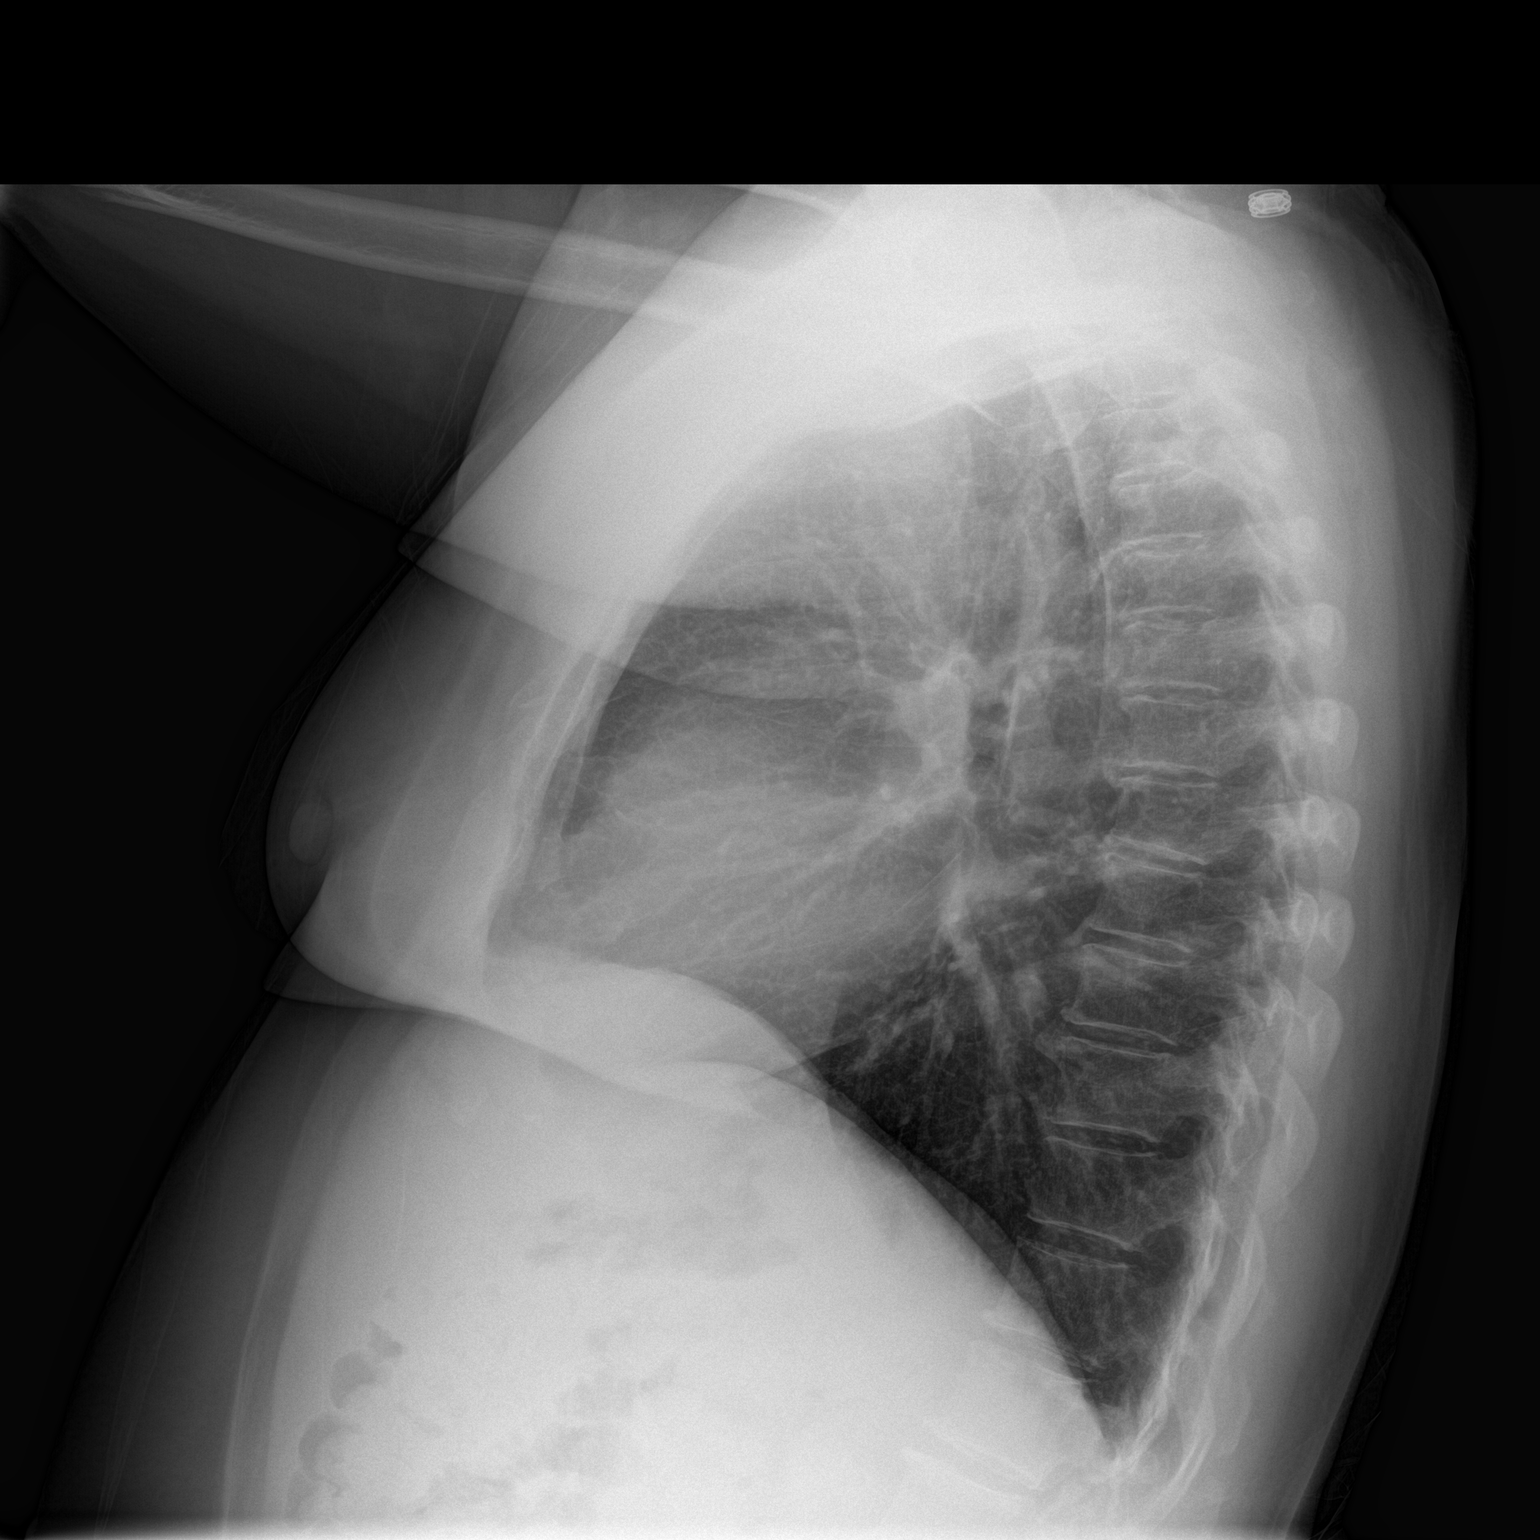

[2 of 2 positions shown; findings below may reference images not displayed]

FINDINGS: The heart size and mediastinal contours are within normal limits.
Both lungs are clear. The visualized skeletal structures are
unremarkable.
IMPRESSION: No active cardiopulmonary disease.

## 2016-11-23 ENCOUNTER — Ambulatory Visit: Payer: Self-pay | Attending: Internal Medicine

## 2016-12-07 ENCOUNTER — Ambulatory Visit: Payer: Self-pay | Attending: Family Medicine | Admitting: Family Medicine

## 2016-12-07 ENCOUNTER — Encounter: Payer: Self-pay | Admitting: Family Medicine

## 2016-12-07 VITALS — BP 127/80 | HR 64 | Temp 98.2°F | Ht 60.5 in | Wt 173.8 lb

## 2016-12-07 DIAGNOSIS — M5441 Lumbago with sciatica, right side: Secondary | ICD-10-CM

## 2016-12-07 DIAGNOSIS — E7800 Pure hypercholesterolemia, unspecified: Secondary | ICD-10-CM

## 2016-12-07 DIAGNOSIS — M25512 Pain in left shoulder: Secondary | ICD-10-CM | POA: Insufficient documentation

## 2016-12-07 DIAGNOSIS — Z794 Long term (current) use of insulin: Secondary | ICD-10-CM | POA: Insufficient documentation

## 2016-12-07 DIAGNOSIS — J329 Chronic sinusitis, unspecified: Secondary | ICD-10-CM | POA: Insufficient documentation

## 2016-12-07 DIAGNOSIS — M25511 Pain in right shoulder: Secondary | ICD-10-CM | POA: Insufficient documentation

## 2016-12-07 DIAGNOSIS — M158 Other polyosteoarthritis: Secondary | ICD-10-CM

## 2016-12-07 DIAGNOSIS — M1990 Unspecified osteoarthritis, unspecified site: Secondary | ICD-10-CM | POA: Insufficient documentation

## 2016-12-07 DIAGNOSIS — Z760 Encounter for issue of repeat prescription: Secondary | ICD-10-CM | POA: Insufficient documentation

## 2016-12-07 DIAGNOSIS — E119 Type 2 diabetes mellitus without complications: Secondary | ICD-10-CM

## 2016-12-07 DIAGNOSIS — G8929 Other chronic pain: Secondary | ICD-10-CM

## 2016-12-07 DIAGNOSIS — M25521 Pain in right elbow: Secondary | ICD-10-CM | POA: Insufficient documentation

## 2016-12-07 DIAGNOSIS — J328 Other chronic sinusitis: Secondary | ICD-10-CM

## 2016-12-07 DIAGNOSIS — M25522 Pain in left elbow: Secondary | ICD-10-CM | POA: Insufficient documentation

## 2016-12-07 LAB — GLUCOSE, POCT (MANUAL RESULT ENTRY): POC GLUCOSE: 184 mg/dL — AB (ref 70–99)

## 2016-12-07 LAB — POCT GLYCOSYLATED HEMOGLOBIN (HGB A1C): Hemoglobin A1C: 7.4

## 2016-12-07 MED ORDER — METFORMIN HCL 1000 MG PO TABS: 1000.0000 mg | ORAL_TABLET | Freq: Two times a day (BID) | ORAL | 5 refills | Status: DC

## 2016-12-07 MED ORDER — CETIRIZINE HCL 10 MG PO TABS: 10.0000 mg | ORAL_TABLET | Freq: Every day | ORAL | 5 refills | Status: DC

## 2016-12-07 MED ORDER — TIZANIDINE HCL 4 MG PO TABS: 4.0000 mg | ORAL_TABLET | Freq: Three times a day (TID) | ORAL | 2 refills | Status: DC | PRN

## 2016-12-07 MED ORDER — FLUTICASONE PROPIONATE 50 MCG/ACT NA SUSP: 2.0000 | Freq: Every day | NASAL | 5 refills | Status: DC

## 2016-12-07 MED ORDER — MELOXICAM 7.5 MG PO TABS: 7.5000 mg | ORAL_TABLET | Freq: Every day | ORAL | 2 refills | Status: DC

## 2016-12-07 MED ORDER — ATORVASTATIN CALCIUM 40 MG PO TABS: 40.0000 mg | ORAL_TABLET | Freq: Every day | ORAL | 5 refills | Status: DC

## 2016-12-07 MED ORDER — GLIPIZIDE 5 MG PO TABS: 2.5000 mg | ORAL_TABLET | Freq: Two times a day (BID) | ORAL | 3 refills | Status: DC

## 2016-12-07 NOTE — Patient Instructions (Signed)
La diabetes mellitus y los alimentos (Diabetes Mellitus and Food) Es importante que controle su nivel de azcar en la sangre (glucosa). El nivel de glucosa en sangre depende en gran medida de lo que usted come. Comer alimentos saludables en las cantidades [place] a lo largo del Training and development officer, aproximadamente a la misma hora [employer], lo ayudar a Chief Technology Officer su nivel de Multimedia programmer. Tambin puede ayudarlo a retrasar o Patent attorney de la diabetes mellitus. Comer de [employer] saludable incluso puede ayudarlo a Chartered loss adjuster de presin arterial y a Science writer o Theatre manager un peso saludable. Entre las recomendaciones generales para alimentarse y Audiological scientist los alimentos de forma saludable, se incluyen las siguientes:  Respetar las comidas principales y comer colaciones con regularidad. Evitar pasar largos perodos sin comer con el fin de perder peso.  Seguir una dieta que consista principalmente en alimentos de origen vegetal, como frutas, vegetales, frutos secos, legumbres y cereales integrales.  Utilizar mtodos de coccin a baja temperatura, como hornear, en lugar de mtodos de coccin a alta temperatura, como frer en abundante aceite. Trabaje con el nutricionista para aprender a Financial planner nutricional de las etiquetas de los alimentos. CMO PUEDEN AFECTARME LOS ALIMENTOS? Carbohidratos Los carbohidratos afectan el nivel de glucosa en sangre ms que cualquier otro tipo de alimento. El nutricionista lo ayudar a Teacher, adult education cuntos carbohidratos puede consumir en cada comida y ensearle a contarlos. El recuento de carbohidratos es importante para mantener la glucosa en sangre en un nivel saludable, en especial si utiliza insulina o toma determinados medicamentos para la diabetes mellitus. Alcohol El alcohol puede provocar disminuciones sbitas de la glucosa en sangre (hipoglucemia), en especial si utiliza insulina o toma determinados medicamentos para la diabetes mellitus. La  hipoglucemia es una afeccin que puede poner en peligro la vida. Los sntomas de la hipoglucemia (somnolencia, mareos y Data processing manager) son similares a los sntomas de haber consumido mucho alcohol. Si el mdico lo autoriza a beber alcohol, hgalo con moderacin y siga estas pautas:  Las mujeres no deben beber ms de un trago por da, y los hombres no deben beber ms de dos tragos por Training and development officer. Un trago es igual a:  12 onzas (355 ml) de cerveza  5 onzas de vino (150 ml) de vino  1,5onzas (23m) de bebidas espirituosas  No beba con el estmago vaco.  Mantngase hidratado. Beba agua, gaseosas dietticas o t helado sin azcar.  Las gaseosas comunes, los jugos y otros refrescos podran contener muchos carbohidratos y se dCivil Service fast streamer QU ALIMENTOS NO SE RECOMIENDAN? Cuando haga las elecciones de alimentos, es importante que recuerde que todos los alimentos son distintos. Algunos tienen menos nutrientes que otros por porcin, aunque podran tener la misma cantidad de caloras o carbohidratos. Es difcil darle al cuerpo lo que necesita cuando consume alimentos con menos nutrientes. Estos son algunos ejemplos de alimentos que debera evitar ya que contienen muchas caloras y carbohidratos, pero pocos nutrientes:  GPhysicist, medicaltrans (la mayora de los alimentos procesados incluyen grasas trans en la etiqueta de Informacin nutricional).  Gaseosas comunes.  Jugos.  Caramelos.  Dulces, como tortas, pasteles, rosquillas y g[place]  Comidas fritas. QU ALIMENTOS PUEDO COMER? Consuma alimentos ricos en nutrientes, que nutrirn el cuerpo y lo mantendrn saludable. Los alimentos que debe comer tambin dependern de varios factores, como:  Las caloras que necesita.  Los medicamentos que toma.  Su peso.  El nivel de glucosa en sElizabeth  El n[place]de presin arterial.  El nivel de colesterol. Debe consumir  una amplia variedad de alimentos, por ejemplo:  Protenas.  Cortes de carne  magros.  Protenas con bajo contenido de grasas saturadas, como pescado, clara de huevo y frijoles. Evite las carnes procesadas.  Frutas y vegetales.  Frutas y vegetales que pueden ayudar a controlar los niveles sanguneos de glucosa, como manzanas, mangos y batatas.  Productos lcteos.  Elija productos lcteos sin grasa o con bajo contenido de grasa, como leche, yogur y queso.  Cereales, panes, pastas y arroz.  Elija cereales integrales, como panes multicereales, avena en grano y arroz integral. Estos alimentos pueden ayudar a controlar la presin arterial.  Grasas.  Alimentos que contengan grasas saludables, como frutos secos, aguacate, aceite de oliva, aceite de canola y pescado. TODOS LOS QUE PADECEN DIABETES MELLITUS TIENEN EL MISMO PLAN DE COMIDAS? Dado que todas las personas que padecen diabetes mellitus son distintas, no hay un solo plan de comidas que funcione para todos. Es muy importante que se rena con un nutricionista que lo ayudar a crear un plan de comidas adecuado para usted. Esta informacin no tiene como fin reemplazar el consejo del mdico. Asegrese de hacerle al mdico cualquier pregunta que tenga. Document Released: 11/13/2007 Document Revised: 08/27/2014 Document Reviewed: 07/03/2013 Elsevier Interactive Patient Education  2017 Elsevier Inc.  

## 2016-12-07 NOTE — Progress Notes (Signed)
Med refills- lipitor, testing strips, lancets, zyrtec, flonase, docusate sodium, mobic, metformin, nasonex, and zanaflex   Not using lantus because her sugars are "ok"

## 2016-12-07 NOTE — Progress Notes (Signed)
Subjective:  Patient ID: Margaret Austin, female    DOB: 12-Sep-1955  Age: 61 y.o. MRN: [id number]  CC: Diabetes; medication refills; and Pain ("bone"- elbows and back)   HPI Margaret Austin is a 61 year old female with a history of type 2 diabetes mellitus (A1c 7.4 which is down from 16 previously), chronic sinusitis, chronic back pain who presents today for a follow-up visit.   Her fasting blood sugars have been in the 130- 140 range and so she discontinued taking lantis. Denies numbness in extremities, hypoglycemia or visual complaints. She has been compliant with her Metformin as well as a Diabetic diet.  Complains of chronic pain in elbows, shoulders and lower back but denies swelling of joints; she has been out of her Mobic and Tizanidine.  Also complains of nasal stuffiness and post nasal drips; has been out of Flonase and Zyrtec.  Her last visit to the clinic was 9 months ago which she states was due to caring for a sick child and being unable to make it to the clinic.  Past Medical History:  Diagnosis Date  . Diabetes mellitus without complication ([place])     History reviewed. No pertinent surgical history.  No Known Allergies   Outpatient Medications Prior to Visit  Medication Sig Dispense Refill  . Blood Glucose Monitoring Suppl (TRUE METRIX METER) w/Device KIT Used as directed, 3 times daily. 1 kit 0  . docusate sodium (RA COL-RITE) 100 MG capsule Take 100 mg by mouth daily. Reported on 01/09/2016    . glucose blood (TRUE METRIX BLOOD GLUCOSE TEST) test strip Use as instructed, 3 times daily 100 each 12  . TRUEPLUS LANCETS 28G MISC Check blood sugar TID 100 each 12  . cetirizine (ZYRTEC) 10 MG tablet TAKE 1 TABLET BY MOUTH DAILY. 30 tablet 1  . fluticasone (FLONASE) 50 MCG/ACT nasal spray Place 2 sprays into both nostrils daily. 16 g 2  . meloxicam (MOBIC) 7.5 MG tablet Take 1 tablet (7.5 mg total) by mouth daily. 30 tablet 2  . metFORMIN (GLUCOPHAGE) 1000 MG  tablet Take 1 tablet (1,000 mg total) by mouth 2 (two) times daily with a meal. 60 tablet 0  . mometasone (NASONEX) 50 MCG/ACT nasal spray Place 2 sprays into the nose daily. 17 g 1  . tiZANidine (ZANAFLEX) 4 MG tablet TAKE 1 TABLET BY MOUTH EVERY 6 HOURS AS NEEDED FOR MUSCLE SPASMS. 30 tablet 0  . Insulin Pen Needle (ULTICARE PEN NEEDLES) 29G X 12.7MM MISC 15 Units by Does not apply route at bedtime. (Patient not taking: Reported on 12/07/2016) 1 each 2  . atorvastatin (LIPITOR) 40 MG tablet Take 1 tablet (40 mg total) by mouth daily. (Patient not taking: Reported on 12/07/2016) 30 tablet 3  . Insulin Glargine (LANTUS) 100 UNIT/ML Solostar Pen Inject 25 Units into the skin daily at 10 pm. (Patient not taking: Reported on 12/07/2016) 15 mL 3   No facility-administered medications prior to visit.     ROS Review of Systems  Constitutional: Negative for activity change, appetite change and fatigue.  HENT: Negative for congestion, sinus pressure and sore throat.   Eyes: Negative for visual disturbance.  Respiratory: Negative for cough, chest tightness, shortness of breath and wheezing.   Cardiovascular: Negative for chest pain and palpitations.  Gastrointestinal: Negative for abdominal distention, abdominal pain and constipation.  Endocrine: Negative for polydipsia.  Genitourinary: Negative for dysuria and frequency.  Musculoskeletal:       See hpi  Skin: Negative for rash.  Neurological: Negative for tremors, light-headedness and numbness.  Hematological: Does not bruise/bleed easily.  Psychiatric/Behavioral: Negative for agitation and behavioral problems.    Objective:  BP 127/80 (BP Location: Right Arm, Patient Position: Sitting, Cuff Size: Small)   Pulse 64   Temp 98.2 F (36.8 C) (Oral)   Ht 5' 0.5" (1.537 m)   Wt 173 lb 12.8 oz (78.8 kg)   SpO2 99%   BMI 33.38 kg/m   BP/Weight 12/07/2016 03/16/2016 09/05/4351  Systolic BP 912 258 346  Diastolic BP 80 80 72  Wt. (Lbs) 173.8 172.8  176  BMI 33.38 32.65 34.37      Physical Exam  Constitutional: She is oriented to person, place, and time. She appears well-developed and well-nourished.  Cardiovascular: Normal rate, normal heart sounds and intact distal pulses.   No murmur heard. Pulmonary/Chest: Effort normal and breath sounds normal. She has no wheezes. She has no rales. She exhibits no tenderness.  Abdominal: Soft. Bowel sounds are normal. She exhibits no distension and no mass. There is no tenderness.  Musculoskeletal: Normal range of motion.  Neurological: She is alert and oriented to person, place, and time.  Skin: Skin is warm and dry.  Psychiatric: She has a normal mood and affect.    Lab Results  Component Value Date   HGBA1C 7.4 12/07/2016    CMP Latest Ref Rng & Units 02/06/2016 09/27/2014 09/04/2014  Glucose 65 - 99 mg/dL 126(H) 313(H) 336(H)  BUN 7 - 25 mg/dL _0 Creatinine 0.50 - 1.05 mg/dL 0.48(L) 0.48(L) 0.56  Sodium 135 - 146 mmol/L 138 135 133(L)  Potassium 3.5 - 5.3 mmol/L 4.1 3.5 4.0  Chloride 98 - 110 mmol/L 101 99 98  CO2 20 - 31 mmol/L _1 Calcium 8.6 - 10.4 mg/dL 9.2 8.8 8.7  Total Protein 6.1 - 8.1 g/dL 7.3 6.4 7.0  Total Bilirubin 0.2 - 1.2 mg/dL 1.1 0.9 1.1  Alkaline Phos 33 - 130 U/L 111 136(H) 157(H)  AST 10 - 35 U/L _2 ALT 6 - 29 U/L 26 27 34    Assessment & Plan:   1. Right-sided low back pain with right-sided sciatica, unspecified chronicity Stable - tiZANidine (ZANAFLEX) 4 MG tablet; Take 1 tablet (4 mg total) by mouth every 8 (eight) hours as needed for muscle spasms.  Dispense: 60 tablet; Refill: 2  2. Type 2 diabetes mellitus without complication, without long-term current use of insulin (HCC) Controlled with a1c of 7.4 which has improved from 13.0 last year Discontinue Lantus Add Glipizide to regimen - Glucose (CBG) - metFORMIN (GLUCOPHAGE) 1000 MG tablet; Take 1 tablet (1,000 mg total) by mouth 2 (two) times daily with a meal.  Dispense: 60  tablet; Refill: 5 - CMP14+EGFR; Future - Lipid panel; Future - Microalbumin/Creatinine Ratio, Urine; Future - glipiZIDE (GLUCOTROL) 5 MG tablet; Take 0.5 tablets (2.5 mg total) by mouth 2 (two) times daily before a meal.  Dispense: 60 tablet; Refill: 3 - HgB A1c  3. Chronic right-sided low back pain with right-sided sciatica - meloxicam (MOBIC) 7.5 MG tablet; Take 1 tablet (7.5 mg total) by mouth daily.  Dispense: 30 tablet; Refill: 2  4. Other chronic sinusitis stable - cetirizine (ZYRTEC) 10 MG tablet; Take 1 tablet (10 mg total) by mouth daily.  Dispense: 30 tablet; Refill: 5 - fluticasone (FLONASE) 50 MCG/ACT nasal spray; Place 2 sprays into both nostrils daily.  Dispense: 16 g; Refill: 5  5. Pure hypercholesterolemia Not compliant with Statin  hence no regimen changes if lipids are elevated - atorvastatin (LIPITOR) 40 MG tablet; Take 1 tablet (40 mg total) by mouth daily.  Dispense: 30 tablet; Refill: 5  6. Other osteoarthritis involving multiple joints Continue NSAIDS   Meds ordered this encounter  Medications  . tiZANidine (ZANAFLEX) 4 MG tablet    Sig: Take 1 tablet (4 mg total) by mouth every 8 (eight) hours as needed for muscle spasms.    Dispense:  60 tablet    Refill:  2  . metFORMIN (GLUCOPHAGE) 1000 MG tablet    Sig: Take 1 tablet (1,000 mg total) by mouth 2 (two) times daily with a meal.    Dispense:  60 tablet    Refill:  5  . meloxicam (MOBIC) 7.5 MG tablet    Sig: Take 1 tablet (7.5 mg total) by mouth daily.    Dispense:  30 tablet    Refill:  2  . cetirizine (ZYRTEC) 10 MG tablet    Sig: Take 1 tablet (10 mg total) by mouth daily.    Dispense:  30 tablet    Refill:  5  . fluticasone (FLONASE) 50 MCG/ACT nasal spray    Sig: Place 2 sprays into both nostrils daily.    Dispense:  16 g    Refill:  5  . atorvastatin (LIPITOR) 40 MG tablet    Sig: Take 1 tablet (40 mg total) by mouth daily.    Dispense:  30 tablet    Refill:  5  . glipiZIDE (GLUCOTROL) 5  MG tablet    Sig: Take 0.5 tablets (2.5 mg total) by mouth 2 (two) times daily before a meal.    Dispense:  60 tablet    Refill:  3    Follow-up: Return in about 3 months (around 03/08/2017) for follow up on Diabetes.   Arnoldo Morale MD

## 2016-12-12 ENCOUNTER — Ambulatory Visit: Payer: Self-pay | Attending: Family Medicine

## 2016-12-12 DIAGNOSIS — E119 Type 2 diabetes mellitus without complications: Secondary | ICD-10-CM

## 2016-12-12 NOTE — Progress Notes (Signed)
Patient here for lab visit only 

## 2016-12-13 LAB — CMP14+EGFR
ALT: 23 IU/L (ref 0–32)
AST: 22 IU/L (ref 0–40)
Albumin/Globulin Ratio: 1.4 (ref 1.2–2.2)
Albumin: 4.3 g/dL (ref 3.5–5.5)
Alkaline Phosphatase: 103 IU/L (ref 39–117)
BILIRUBIN TOTAL: 0.9 mg/dL (ref 0.0–1.2)
BUN/Creatinine Ratio: 19 (ref 9–23)
BUN: 11 mg/dL (ref 6–24)
CO2: 26 mmol/L (ref 18–29)
Calcium: 9 mg/dL (ref 8.7–10.2)
Chloride: 100 mmol/L (ref 96–106)
Creatinine, Ser: 0.58 mg/dL (ref 0.57–1.00)
GFR calc non Af Amer: 102 mL/min/{1.73_m2} (ref >59)
GFR, EST AFRICAN AMERICAN: 117 mL/min/{1.73_m2} (ref >59)
Globulin, Total: 3 g/dL (ref 1.5–4.5)
Glucose: 125 mg/dL — ABNORMAL HIGH (ref 65–99)
POTASSIUM: 4.1 mmol/L (ref 3.5–5.2)
Sodium: 141 mmol/L (ref 134–144)
Total Protein: 7.3 g/dL (ref 6.0–8.5)

## 2016-12-13 LAB — LIPID PANEL
Chol/HDL Ratio: 4.7 ratio — ABNORMAL HIGH (ref 0.0–4.4)
Cholesterol, Total: 226 mg/dL — ABNORMAL HIGH (ref 100–199)
HDL: 48 mg/dL (ref >39)
LDL Calculated: 149 mg/dL — ABNORMAL HIGH (ref 0–99)
Triglycerides: 144 mg/dL (ref 0–149)
VLDL Cholesterol Cal: 29 mg/dL (ref 5–40)

## 2016-12-13 LAB — MICROALBUMIN / CREATININE URINE RATIO
CREATININE, UR: 34.1 mg/dL
Microalbumin, Urine: <3 ug/mL

## 2016-12-19 ENCOUNTER — Telehealth: Payer: Self-pay

## 2016-12-19 NOTE — Telephone Encounter (Signed)
Through [facility] [phone] patient was given her lab results.  Patient very confused on her meds.  Writer through the interpreter discussed each med that she is on and what it is used for.  Patient stated understanding.

## 2016-12-19 NOTE — Telephone Encounter (Signed)
-----   Message from Arnoldo Morale, MD sent at 12/13/2016  1:24 PM EDT ----- Cholesterol is elevated likely due to noncompliance. Please encourage compliance and low-cholesterol diet.

## 2017-03-12 ENCOUNTER — Encounter: Payer: Self-pay | Admitting: Family Medicine

## 2017-03-12 ENCOUNTER — Ambulatory Visit: Payer: Self-pay | Attending: Family Medicine | Admitting: Family Medicine

## 2017-03-12 VITALS — BP 104/63 | HR 68 | Temp 98.4°F | Resp 18 | Ht 62.0 in | Wt 164.7 lb

## 2017-03-12 DIAGNOSIS — Z1211 Encounter for screening for malignant neoplasm of colon: Secondary | ICD-10-CM

## 2017-03-12 DIAGNOSIS — E119 Type 2 diabetes mellitus without complications: Secondary | ICD-10-CM

## 2017-03-12 DIAGNOSIS — H04203 Unspecified epiphora, bilateral lacrimal glands: Secondary | ICD-10-CM

## 2017-03-12 DIAGNOSIS — Z7984 Long term (current) use of oral hypoglycemic drugs: Secondary | ICD-10-CM | POA: Insufficient documentation

## 2017-03-12 DIAGNOSIS — J329 Chronic sinusitis, unspecified: Secondary | ICD-10-CM | POA: Insufficient documentation

## 2017-03-12 DIAGNOSIS — E7800 Pure hypercholesterolemia, unspecified: Secondary | ICD-10-CM

## 2017-03-12 DIAGNOSIS — J328 Other chronic sinusitis: Secondary | ICD-10-CM

## 2017-03-12 DIAGNOSIS — M5441 Lumbago with sciatica, right side: Secondary | ICD-10-CM

## 2017-03-12 DIAGNOSIS — H9201 Otalgia, right ear: Secondary | ICD-10-CM | POA: Insufficient documentation

## 2017-03-12 DIAGNOSIS — G8929 Other chronic pain: Secondary | ICD-10-CM

## 2017-03-12 LAB — POCT GLYCOSYLATED HEMOGLOBIN (HGB A1C): Hemoglobin A1C: 10.3

## 2017-03-12 LAB — GLUCOSE, POCT (MANUAL RESULT ENTRY): POC Glucose: 209 mg/dl — AB (ref 70–99)

## 2017-03-12 MED ORDER — MELOXICAM 7.5 MG PO TABS: 7.5000 mg | ORAL_TABLET | Freq: Every day | ORAL | 2 refills | Status: DC

## 2017-03-12 MED ORDER — CETIRIZINE HCL 10 MG PO TABS: 10.0000 mg | ORAL_TABLET | Freq: Every day | ORAL | 5 refills | Status: DC

## 2017-03-12 MED ORDER — OLOPATADINE HCL 0.1 % OP SOLN: 1.0000 [drp] | Freq: Two times a day (BID) | OPHTHALMIC | 1 refills | Status: DC

## 2017-03-12 MED ORDER — METFORMIN HCL 1000 MG PO TABS: 1000.0000 mg | ORAL_TABLET | Freq: Two times a day (BID) | ORAL | 5 refills | Status: DC

## 2017-03-12 MED ORDER — GLIPIZIDE 10 MG PO TABS: 10.0000 mg | ORAL_TABLET | Freq: Two times a day (BID) | ORAL | 5 refills | Status: DC

## 2017-03-12 MED ORDER — HYDROCORTISONE 2.5 % EX CREA: TOPICAL_CREAM | Freq: Two times a day (BID) | CUTANEOUS | 0 refills | Status: DC

## 2017-03-12 MED ORDER — TIZANIDINE HCL 4 MG PO TABS: 4.0000 mg | ORAL_TABLET | Freq: Three times a day (TID) | ORAL | 2 refills | Status: DC | PRN

## 2017-03-12 MED ORDER — ATORVASTATIN CALCIUM 40 MG PO TABS: 40.0000 mg | ORAL_TABLET | Freq: Every day | ORAL | 5 refills | Status: DC

## 2017-03-12 MED ORDER — FLUTICASONE PROPIONATE 50 MCG/ACT NA SUSP: 2.0000 | Freq: Every day | NASAL | 5 refills | Status: DC

## 2017-03-12 NOTE — Progress Notes (Signed)
Patient has not eaten Patient has had her medication  Patient needs a refill on all medication

## 2017-03-12 NOTE — Progress Notes (Signed)
Subjective:  Patient ID: Margaret Austin, female    DOB: [birth year]  Age: 61 y.o. MRN: [id number]  CC: Follow-up; Diabetes; and Ear Pain   HPI Margaret Austin is a 61 year old female with a history of type 2 diabetes mellitus (A1c 10.3 which is up from 7.4), hyperlipidemia chronic sinusitis, chronic back pain who presents today for a follow-up visit.   Her fasting blood sugars have been in the 200 range and she occasionally has sugars of 85. She informed me at her last office visit she had discontinued taking lantus due to low sugars and was placed on low-dose glipizide. Her blood sugars are now elevated with trending up of her A1c from 7.4 to10.3. to  Denies numbness in extremities, hypoglycemia or visual complaints. She has been compliant with her Metformin, glipizide as well as a Diabetic diet.  She complains of intermittent right ear pain but then points to her right lower jaw; currently on meloxicam and I have advised that she could have some form of temporomandibular joint dysfunction. Also complains of tearing from both eyes like to have eyedrops for this. Denies hearing loss .  Chronic back pain is controlled on NSAID and muscle relaxant.   Past Medical History:  Diagnosis Date  . Diabetes mellitus without complication ([place])     No past surgical history on file.  No Known Allergies   Outpatient Medications Prior to Visit  Medication Sig Dispense Refill  . Blood Glucose Monitoring Suppl (TRUE METRIX METER) w/Device KIT Used as directed, 3 times daily. 1 kit 0  . glucose blood (TRUE METRIX BLOOD GLUCOSE TEST) test strip Use as instructed, 3 times daily 100 each 12  . Insulin Pen Needle (ULTICARE PEN NEEDLES) 29G X 12.7MM MISC 15 Units by Does not apply route at bedtime. 1 each 2  . TRUEPLUS LANCETS 28G MISC Check blood sugar TID 100 each 12  . atorvastatin (LIPITOR) 40 MG tablet Take 1 tablet (40 mg total) by mouth daily. 30 tablet 5  . cetirizine (ZYRTEC) 10 MG  tablet Take 1 tablet (10 mg total) by mouth daily. 30 tablet 5  . fluticasone (FLONASE) 50 MCG/ACT nasal spray Place 2 sprays into both nostrils daily. 16 g 5  . glipiZIDE (GLUCOTROL) 5 MG tablet Take 0.5 tablets (2.5 mg total) by mouth 2 (two) times daily before a meal. 60 tablet 3  . meloxicam (MOBIC) 7.5 MG tablet Take 1 tablet (7.5 mg total) by mouth daily. 30 tablet 2  . metFORMIN (GLUCOPHAGE) 1000 MG tablet Take 1 tablet (1,000 mg total) by mouth 2 (two) times daily with a meal. 60 tablet 5  . tiZANidine (ZANAFLEX) 4 MG tablet Take 1 tablet (4 mg total) by mouth every 8 (eight) hours as needed for muscle spasms. 60 tablet 2  . docusate sodium (RA COL-RITE) 100 MG capsule Take 100 mg by mouth daily. Reported on 01/09/2016     No facility-administered medications prior to visit.     ROS Review of Systems  Constitutional: Negative for activity change, appetite change and fatigue.  HENT: Positive for ear pain. Negative for congestion, sinus pressure and sore throat.   Eyes: Positive for discharge (tearing from both eyes). Negative for visual disturbance.  Respiratory: Negative for cough, chest tightness, shortness of breath and wheezing.   Cardiovascular: Negative for chest pain and palpitations.  Gastrointestinal: Negative for abdominal distention, abdominal pain and constipation.  Endocrine: Negative for polydipsia.  Genitourinary: Negative for dysuria and frequency.  Musculoskeletal: Negative for arthralgias  and back pain.  Skin: Negative for rash.  Neurological: Negative for tremors, light-headedness and numbness.  Hematological: Does not bruise/bleed easily.  Psychiatric/Behavioral: Negative for agitation and behavioral problems.    Objective:  BP 104/63 (BP Location: Left Arm, Patient Position: Sitting, Cuff Size: Normal)   Pulse 68   Temp 98.4 F (36.9 C) (Oral)   Resp 18   Ht _0  (1.575 m)   Wt 164 lb 11.2 oz (74.7 kg)   SpO2 99%   BMI 30.12 kg/m   BP/Weight  03/12/2017 12/07/2016 3/53/2992  Systolic BP 426 834 196  Diastolic BP 63 80 80  Wt. (Lbs) 164.7 173.8 172.8  BMI 30.12 33.38 32.65      Physical Exam  Constitutional: She is oriented to person, place, and time. She appears well-developed and well-nourished.  HENT:  Cerumen obscuring both TM Tenderness in angle of the jaw on right side  Cardiovascular: Normal rate, normal heart sounds and intact distal pulses.   No murmur heard. Pulmonary/Chest: Effort normal and breath sounds normal. She has no wheezes. She has no rales. She exhibits no tenderness.  Abdominal: Soft. Bowel sounds are normal. She exhibits no distension and no mass. There is no tenderness.  Musculoskeletal: Normal range of motion.  Neurological: She is alert and oriented to person, place, and time.  Skin: Skin is warm and dry.  Psychiatric: She has a normal mood and affect.      CMP Latest Ref Rng & Units 12/12/2016 02/06/2016 09/27/2014  Glucose 65 - 99 mg/dL 125(H) 126(H) 313(H)  BUN 6 - 24 mg/dL _1 Creatinine 0.57 - 1.00 mg/dL 0.58 0.48(L) 0.48(L)  Sodium 134 - 144 mmol/L 141 138 135  Potassium 3.5 - 5.2 mmol/L 4.1 4.1 3.5  Chloride 96 - 106 mmol/L 100 101 99  CO2 18 - 29 mmol/L _2 Calcium 8.7 - 10.2 mg/dL 9.0 9.2 8.8  Total Protein 6.0 - 8.5 g/dL 7.3 7.3 6.4  Total Bilirubin 0.0 - 1.2 mg/dL 0.9 1.1 0.9  Alkaline Phos 39 - 117 IU/L 103 111 136(H)  AST 0 - 40 IU/L _3 ALT 0 - 32 IU/L _4 Lipid Panel     Component Value Date/Time   CHOL 226 (H) 12/12/2016 0855   TRIG 144 12/12/2016 0855   HDL 48 12/12/2016 0855   CHOLHDL 4.7 (H) 12/12/2016 0855   CHOLHDL 5.2 (H) 02/06/2016 1557   VLDL 42 (H) 02/06/2016 1557   LDLCALC 149 (H) 12/12/2016 0855     Lab Results  Component Value Date   HGBA1C 10.3 03/12/2017    Assessment & Plan:   1. Type 2 diabetes mellitus without complication, without long-term current use of insulin (HCC) Uncontrolled with A1c of 10.3 which has  trended up from 7.4 previously Increased dose of glipizide Diabetic diet, lifestyle modifications - HgB A1c - Glucose (CBG) - metFORMIN (GLUCOPHAGE) 1000 MG tablet; Take 1 tablet (1,000 mg total) by mouth 2 (two) times daily with a meal.  Dispense: 60 tablet; Refill: 5 - CMP14+EGFR - Lipid panel - glipiZIDE (GLUCOTROL) 10 MG tablet; Take 1 tablet (10 mg total) by mouth 2 (two) times daily before a meal.  Dispense: 60 tablet; Refill: 5  2. Pure hypercholesterolemia Uncontrolled Lipid panel today and willAdjust regimen if elevated  - atorvastatin (LIPITOR) 40 MG tablet; Take 1 tablet (40 mg total) by mouth daily.  Dispense: 30 tablet; Refill: 5  3. Other chronic sinusitis Stable -  fluticasone (FLONASE) 50 MCG/ACT nasal spray; Place 2 sprays into both nostrils daily.  Dispense: 16 g; Refill: 5 - cetirizine (ZYRTEC) 10 MG tablet; Take 1 tablet (10 mg total) by mouth daily.  Dispense: 30 tablet; Refill: 5  4. Right-sided low back pain with right-sided sciatica, unspecified chronicity No acute flares - tiZANidine (ZANAFLEX) 4 MG tablet; Take 1 tablet (4 mg total) by mouth every 8 (eight) hours as needed for muscle spasms.  Dispense: 60 tablet; Refill: 2  5. Chronic right-sided low back pain with right-sided sciatica Stable - meloxicam (MOBIC) 7.5 MG tablet; Take 1 tablet (7.5 mg total) by mouth daily.  Dispense: 30 tablet; Refill: 2  6. Tearing eyes - olopatadine (PATANOL) 0.1 % ophthalmic solution; Place 1 drop into both eyes 2 (two) times daily.  Dispense: 5 mL; Refill: 1  7. Screening for colon cancer - Ambulatory referral to Gastroenterology  8. Earache Advised to use OTC debrox  Meds ordered this encounter  Medications  . hydrocortisone 2.5 % cream    Sig: Apply topically 2 (two) times daily.    Dispense:  30 g    Refill:  0  . atorvastatin (LIPITOR) 40 MG tablet    Sig: Take 1 tablet (40 mg total) by mouth daily.    Dispense:  30 tablet    Refill:  5  . metFORMIN  (GLUCOPHAGE) 1000 MG tablet    Sig: Take 1 tablet (1,000 mg total) by mouth 2 (two) times daily with a meal.    Dispense:  60 tablet    Refill:  5  . fluticasone (FLONASE) 50 MCG/ACT nasal spray    Sig: Place 2 sprays into both nostrils daily.    Dispense:  16 g    Refill:  5  . tiZANidine (ZANAFLEX) 4 MG tablet    Sig: Take 1 tablet (4 mg total) by mouth every 8 (eight) hours as needed for muscle spasms.    Dispense:  60 tablet    Refill:  2  . meloxicam (MOBIC) 7.5 MG tablet    Sig: Take 1 tablet (7.5 mg total) by mouth daily.    Dispense:  30 tablet    Refill:  2  . cetirizine (ZYRTEC) 10 MG tablet    Sig: Take 1 tablet (10 mg total) by mouth daily.    Dispense:  30 tablet    Refill:  5  . olopatadine (PATANOL) 0.1 % ophthalmic solution    Sig: Place 1 drop into both eyes 2 (two) times daily.    Dispense:  5 mL    Refill:  1  . glipiZIDE (GLUCOTROL) 10 MG tablet    Sig: Take 1 tablet (10 mg total) by mouth 2 (two) times daily before a meal.    Dispense:  60 tablet    Refill:  5    Discontinue previous dose    Follow-up: Return in about 3 months (around 06/12/2017) for follow up on Diabetes .   Arnoldo Morale MD

## 2017-03-12 NOTE — Patient Instructions (Signed)
Diabetes Mellitus and Food It is important for you to manage your blood sugar (glucose) level. Your blood glucose level can be greatly affected by what you eat. Eating healthier foods in the appropriate amounts throughout the day at about the same time each day will help you control your blood glucose level. It can also help slow or prevent worsening of your diabetes mellitus. Healthy eating may even help you improve the level of your blood pressure and reach or maintain a healthy weight. General recommendations for healthful eating and cooking habits include:  Eating meals and snacks regularly. Avoid going long periods of time without eating to lose weight.  Eating a diet that consists mainly of plant-based foods, such as fruits, vegetables, nuts, legumes, and whole grains.  Using low-heat cooking methods, such as baking, instead of high-heat cooking methods, such as deep frying.  Work with your dietitian to make sure you understand how to use the Nutrition Facts information on food labels. How can food affect me? Carbohydrates Carbohydrates affect your blood glucose level more than any other type of food. Your dietitian will help you determine how many carbohydrates to eat at each meal and teach you how to count carbohydrates. Counting carbohydrates is important to keep your blood glucose at a healthy level, especially if you are using insulin or taking certain medicines for diabetes mellitus. Alcohol Alcohol can cause sudden decreases in blood glucose (hypoglycemia), especially if you use insulin or take certain medicines for diabetes mellitus. Hypoglycemia can be a life-threatening condition. Symptoms of hypoglycemia (sleepiness, dizziness, and disorientation) are similar to symptoms of having too much alcohol. If your health care provider has given you approval to drink alcohol, do so in moderation and use the following guidelines:  Women should not have more than one drink per day, and men  should not have more than two drinks per day. One drink is equal to: ? 12 oz of beer. ? 5 oz of wine. ? 1 oz of hard liquor.  Do not drink on an empty stomach.  Keep yourself hydrated. Have water, diet soda, or unsweetened iced tea.  Regular soda, juice, and other mixers might contain a lot of carbohydrates and should be counted.  What foods are not recommended? As you make food choices, it is important to remember that all foods are not the same. Some foods have fewer nutrients per serving than other foods, even though they might have the same number of calories or carbohydrates. It is difficult to get your body what it needs when you eat foods with fewer nutrients. Examples of foods that you should avoid that are high in calories and carbohydrates but low in nutrients include:  Trans fats (most processed foods list trans fats on the Nutrition Facts label).  Regular soda.  Juice.  Candy.  Sweets, such as cake, pie, doughnuts, and cookies.  Fried foods.  What foods can I eat? Eat nutrient-rich foods, which will nourish your body and keep you healthy. The food you should eat also will depend on several factors, including:  The calories you need.  The medicines you take.  Your weight.  Your blood glucose level.  Your blood pressure level.  Your cholesterol level.  You should eat a variety of foods, including:  Protein. ? Lean cuts of meat. ? Proteins low in saturated fats, such as fish, egg whites, and beans. Avoid processed meats.  Fruits and vegetables. ? Fruits and vegetables that may help control blood glucose levels, such as apples,   mangoes, and yams.  Dairy products. ? Choose fat-free or low-fat dairy products, such as milk, yogurt, and cheese.  Grains, bread, pasta, and rice. ? Choose whole grain products, such as multigrain bread, whole oats, and brown rice. These foods may help control blood pressure.  Fats. ? Foods containing healthful fats, such as  nuts, avocado, olive oil, canola oil, and fish.  Does everyone with diabetes mellitus have the same meal plan? Because every person with diabetes mellitus is different, there is not one meal plan that works for everyone. It is very important that you meet with a dietitian who will help you create a meal plan that is just right for you. This information is not intended to replace advice given to you by your health care provider. Make sure you discuss any questions you have with your health care provider. Document Released: 05/03/2005 Document Revised: 01/12/2016 Document Reviewed: 07/03/2013 Elsevier Interactive Patient Education  2017 [employer]  

## 2017-03-13 LAB — CMP14+EGFR
ALT: 24 IU/L (ref 0–32)
AST: 18 IU/L (ref 0–40)
Albumin/Globulin Ratio: 1.5 (ref 1.2–2.2)
Albumin: 4.1 g/dL (ref 3.5–5.5)
Alkaline Phosphatase: 162 IU/L — ABNORMAL HIGH (ref 39–117)
BUN/Creatinine Ratio: 13 (ref 9–23)
BUN: 7 mg/dL (ref 6–24)
Bilirubin Total: 0.8 mg/dL (ref 0.0–1.2)
CALCIUM: 8.9 mg/dL (ref 8.7–10.2)
CO2: 23 mmol/L (ref 20–29)
CREATININE: 0.52 mg/dL — AB (ref 0.57–1.00)
Chloride: 101 mmol/L (ref 96–106)
GFR calc Af Amer: 122 mL/min/{1.73_m2} (ref >59)
GFR, EST NON AFRICAN AMERICAN: 106 mL/min/{1.73_m2} (ref >59)
Globulin, Total: 2.7 g/dL (ref 1.5–4.5)
Glucose: 193 mg/dL — ABNORMAL HIGH (ref 65–99)
Potassium: 4 mmol/L (ref 3.5–5.2)
Sodium: 139 mmol/L (ref 134–144)
Total Protein: 6.8 g/dL (ref 6.0–8.5)

## 2017-03-13 LAB — LIPID PANEL
CHOL/HDL RATIO: 3.4 ratio (ref 0.0–4.4)
Cholesterol, Total: 123 mg/dL (ref 100–199)
HDL: 36 mg/dL — AB (ref >39)
LDL CALC: 70 mg/dL (ref 0–99)
TRIGLYCERIDES: 86 mg/dL (ref 0–149)
VLDL Cholesterol Cal: 17 mg/dL (ref 5–40)

## 2017-03-15 ENCOUNTER — Telehealth: Payer: Self-pay | Admitting: *Deleted

## 2017-03-15 NOTE — Telephone Encounter (Signed)
Left message to return call on voicemail by assistance of interpreter: [phone]  Notes recorded by Arnoldo Morale, MD on 03/13/2017 at 4:29 PM EDT Cholesterol is normal, electrolytes and kidney function is stable. One of her enzymes is elevated and this could be from osteoarthritis or her back pain. We will monitor this.

## 2017-03-15 NOTE — Telephone Encounter (Signed)
Pt. Returned nurse call. Please f/u with pt.  °

## 2017-03-15 NOTE — Telephone Encounter (Signed)
Pt. Called requesting lab results. Pt. States she works and she is available at 10 a.m. And at 1 p.m. Pt. Also states that the nurse can leave a voice message on her phone. Please f/u

## 2017-03-15 NOTE — Telephone Encounter (Signed)
Attempt to made to call patient to inform of lab results. Left message on voicemail to return call.

## 2017-03-18 NOTE — Telephone Encounter (Signed)
Pt return your call, she is at work, if you can call her back a leave a detail voice message of the lab result, incase she doe not answer. Please follow up

## 2017-03-18 NOTE — Telephone Encounter (Signed)
Left message on voicemail.  [place], [place] assisted with call.  May call for questions.

## 2017-03-19 NOTE — Telephone Encounter (Signed)
Pt aware of results. Assistance with [employer] interpreter: [phone]

## 2017-03-28 NOTE — Telephone Encounter (Signed)
Will route to nurse °

## 2017-06-04 ENCOUNTER — Ambulatory Visit: Payer: Self-pay

## 2017-06-12 ENCOUNTER — Ambulatory Visit: Payer: Self-pay | Attending: Family Medicine | Admitting: Family Medicine

## 2017-06-12 ENCOUNTER — Encounter: Payer: Self-pay | Admitting: Family Medicine

## 2017-06-12 ENCOUNTER — Ambulatory Visit: Payer: Self-pay | Attending: Family Medicine

## 2017-06-12 VITALS — BP 114/71 | HR 63 | Temp 97.7°F | Ht 62.0 in | Wt 164.6 lb

## 2017-06-12 DIAGNOSIS — E7800 Pure hypercholesterolemia, unspecified: Secondary | ICD-10-CM

## 2017-06-12 DIAGNOSIS — J328 Other chronic sinusitis: Secondary | ICD-10-CM

## 2017-06-12 DIAGNOSIS — Z79899 Other long term (current) drug therapy: Secondary | ICD-10-CM | POA: Insufficient documentation

## 2017-06-12 DIAGNOSIS — Z794 Long term (current) use of insulin: Secondary | ICD-10-CM | POA: Insufficient documentation

## 2017-06-12 DIAGNOSIS — Z23 Encounter for immunization: Secondary | ICD-10-CM

## 2017-06-12 DIAGNOSIS — G8929 Other chronic pain: Secondary | ICD-10-CM | POA: Insufficient documentation

## 2017-06-12 DIAGNOSIS — M5441 Lumbago with sciatica, right side: Secondary | ICD-10-CM

## 2017-06-12 DIAGNOSIS — E119 Type 2 diabetes mellitus without complications: Secondary | ICD-10-CM

## 2017-06-12 DIAGNOSIS — J0100 Acute maxillary sinusitis, unspecified: Secondary | ICD-10-CM

## 2017-06-12 LAB — POCT GLYCOSYLATED HEMOGLOBIN (HGB A1C): Hemoglobin A1C: 10.6

## 2017-06-12 LAB — GLUCOSE, POCT (MANUAL RESULT ENTRY): POC Glucose: 178 mg/dl — AB (ref 70–99)

## 2017-06-12 MED ORDER — FLUTICASONE PROPIONATE 50 MCG/ACT NA SUSP: 2.0000 | Freq: Every day | NASAL | 5 refills | Status: DC

## 2017-06-12 MED ORDER — AMOXICILLIN 500 MG PO CAPS: 500.0000 mg | ORAL_CAPSULE | Freq: Three times a day (TID) | ORAL | 0 refills | Status: DC

## 2017-06-12 MED ORDER — TIZANIDINE HCL 4 MG PO TABS: 4.0000 mg | ORAL_TABLET | Freq: Three times a day (TID) | ORAL | 2 refills | Status: DC | PRN

## 2017-06-12 MED ORDER — METFORMIN HCL 1000 MG PO TABS: 1000.0000 mg | ORAL_TABLET | Freq: Two times a day (BID) | ORAL | 5 refills | Status: DC

## 2017-06-12 MED ORDER — ATORVASTATIN CALCIUM 40 MG PO TABS: 40.0000 mg | ORAL_TABLET | Freq: Every day | ORAL | 5 refills | Status: DC

## 2017-06-12 MED ORDER — GLIPIZIDE 10 MG PO TABS: 10.0000 mg | ORAL_TABLET | Freq: Two times a day (BID) | ORAL | 5 refills | Status: DC

## 2017-06-12 NOTE — Patient Instructions (Signed)
Diabetes Mellitus and Food It is important for you to manage your blood sugar (glucose) level. Your blood glucose level can be greatly affected by what you eat. Eating healthier foods in the appropriate amounts throughout the day at about the same time each day will help you control your blood glucose level. It can also help slow or prevent worsening of your diabetes mellitus. Healthy eating may even help you improve the level of your blood pressure and reach or maintain a healthy weight. General recommendations for healthful eating and cooking habits include:  Eating meals and snacks regularly. Avoid going long periods of time without eating to lose weight.  Eating a diet that consists mainly of plant-based foods, such as fruits, vegetables, nuts, legumes, and whole grains.  Using low-heat cooking methods, such as baking, instead of high-heat cooking methods, such as deep frying.  Work with your dietitian to make sure you understand how to use the Nutrition Facts information on food labels. How can food affect me? Carbohydrates Carbohydrates affect your blood glucose level more than any other type of food. Your dietitian will help you determine how many carbohydrates to eat at each meal and teach you how to count carbohydrates. Counting carbohydrates is important to keep your blood glucose at a healthy level, especially if you are using insulin or taking certain medicines for diabetes mellitus. Alcohol Alcohol can cause sudden decreases in blood glucose (hypoglycemia), especially if you use insulin or take certain medicines for diabetes mellitus. Hypoglycemia can be a life-threatening condition. Symptoms of hypoglycemia (sleepiness, dizziness, and disorientation) are similar to symptoms of having too much alcohol. If your health care provider has given you approval to drink alcohol, do so in moderation and use the following guidelines:  Women should not have more than one drink per day, and men  should not have more than two drinks per day. One drink is equal to: ? 12 oz of beer. ? 5 oz of wine. ? 1 oz of hard liquor.  Do not drink on an empty stomach.  Keep yourself hydrated. Have water, diet soda, or unsweetened iced tea.  Regular soda, juice, and other mixers might contain a lot of carbohydrates and should be counted.  What foods are not recommended? As you make food choices, it is important to remember that all foods are not the same. Some foods have fewer nutrients per serving than other foods, even though they might have the same number of calories or carbohydrates. It is difficult to get your body what it needs when you eat foods with fewer nutrients. Examples of foods that you should avoid that are high in calories and carbohydrates but low in nutrients include:  Trans fats (most processed foods list trans fats on the Nutrition Facts label).  Regular soda.  Juice.  Candy.  Sweets, such as cake, pie, doughnuts, and cookies.  Fried foods.  What foods can I eat? Eat nutrient-rich foods, which will nourish your body and keep you healthy. The food you should eat also will depend on several factors, including:  The calories you need.  The medicines you take.  Your weight.  Your blood glucose level.  Your blood pressure level.  Your cholesterol level.  You should eat a variety of foods, including:  Protein. ? Lean cuts of meat. ? Proteins low in saturated fats, such as fish, egg whites, and beans. Avoid processed meats.  Fruits and vegetables. ? Fruits and vegetables that may help control blood glucose levels, such as apples,   mangoes, and yams.  Dairy products. ? Choose fat-free or low-fat dairy products, such as milk, yogurt, and cheese.  Grains, bread, pasta, and rice. ? Choose whole grain products, such as multigrain bread, whole oats, and brown rice. These foods may help control blood pressure.  Fats. ? Foods containing healthful fats, such as  nuts, avocado, olive oil, canola oil, and fish.  Does everyone with diabetes mellitus have the same meal plan? Because every person with diabetes mellitus is different, there is not one meal plan that works for everyone. It is very important that you meet with a dietitian who will help you create a meal plan that is just right for you. This information is not intended to replace advice given to you by your health care provider. Make sure you discuss any questions you have with your health care provider. Document Released: 05/03/2005 Document Revised: 01/12/2016 Document Reviewed: 07/03/2013 Elsevier Interactive Patient Education  2017 [employer]  

## 2017-06-12 NOTE — Progress Notes (Signed)
Subjective:  Patient ID: Margaret Austin, female    DOB: 12-Jun-1956  Age: 61 y.o. MRN: [id number]  CC: Diabetes and Sore Throat   HPI Margaret Austin is a 61 year old female with a history of type 2 diabetes mellitus (A1c 10.6), hyperlipidemia chronic sinusitis, chronic back pain who presents today for a follow-up visit.  Her A1c was 10.3 at her last visit and is 10.6 today and review of her med list indicates she has been taking just metformin but glipizide have been added at her last visit which she does not seem to be taking. She does not have her blood sugar log with her.  She has been compliant with her statin and denies myalgias.  Today she complains of a 2 week history of submandibular swollen glands, headaches, tearing of her eyes, arthralgias and subjective fevers. She has also had a sore throat worse on swallowing. She does have a history of chronic sinusitis  Past Medical History:  Diagnosis Date  . Diabetes mellitus without complication ([place])     No past surgical history on file.  No Known Allergies   Outpatient Medications Prior to Visit  Medication Sig Dispense Refill  . Blood Glucose Monitoring Suppl (TRUE METRIX METER) w/Device KIT Used as directed, 3 times daily. 1 kit 0  . glucose blood (TRUE METRIX BLOOD GLUCOSE TEST) test strip Use as instructed, 3 times daily 100 each 12  . meloxicam (MOBIC) 7.5 MG tablet Take 1 tablet (7.5 mg total) by mouth daily. 30 tablet 2  . TRUEPLUS LANCETS 28G MISC Check blood sugar TID 100 each 12  . metFORMIN (GLUCOPHAGE) 1000 MG tablet Take 1 tablet (1,000 mg total) by mouth 2 (two) times daily with a meal. 60 tablet 5  . tiZANidine (ZANAFLEX) 4 MG tablet Take 1 tablet (4 mg total) by mouth every 8 (eight) hours as needed for muscle spasms. 60 tablet 2  . cetirizine (ZYRTEC) 10 MG tablet Take 1 tablet (10 mg total) by mouth daily. (Patient not taking: Reported on 06/12/2017) 30 tablet 5  . docusate sodium (RA COL-RITE) 100 MG  capsule Take 100 mg by mouth daily. Reported on 01/09/2016    . hydrocortisone 2.5 % cream Apply topically 2 (two) times daily. (Patient not taking: Reported on 06/12/2017) 30 g 0  . Insulin Pen Needle (ULTICARE PEN NEEDLES) 29G X 12.7MM MISC 15 Units by Does not apply route at bedtime. (Patient not taking: Reported on 06/12/2017) 1 each 2  . olopatadine (PATANOL) 0.1 % ophthalmic solution Place 1 drop into both eyes 2 (two) times daily. (Patient not taking: Reported on 06/12/2017) 5 mL 1  . atorvastatin (LIPITOR) 40 MG tablet Take 1 tablet (40 mg total) by mouth daily. (Patient not taking: Reported on 06/12/2017) 30 tablet 5  . fluticasone (FLONASE) 50 MCG/ACT nasal spray Place 2 sprays into both nostrils daily. (Patient not taking: Reported on 06/12/2017) 16 g 5  . glipiZIDE (GLUCOTROL) 10 MG tablet Take 1 tablet (10 mg total) by mouth 2 (two) times daily before a meal. (Patient not taking: Reported on 06/12/2017) 60 tablet 5   No facility-administered medications prior to visit.     ROS Review of Systems  Constitutional: Negative for activity change, appetite change and fatigue.  HENT: Positive for sore throat. Negative for congestion and sinus pressure.   Eyes: Negative for visual disturbance.  Respiratory: Negative for cough, chest tightness, shortness of breath and wheezing.   Cardiovascular: Negative for chest pain and palpitations.  Gastrointestinal: Negative  for abdominal distention, abdominal pain and constipation.  Endocrine: Negative for polydipsia.  Genitourinary: Negative for dysuria and frequency.  Musculoskeletal: Negative for arthralgias and back pain.  Skin: Negative for rash.  Neurological: Positive for headaches. Negative for tremors, light-headedness and numbness.  Hematological: Does not bruise/bleed easily.  Psychiatric/Behavioral: Negative for agitation and behavioral problems.    Objective:  BP 114/71   Pulse 63   Temp 97.7 F (36.5 C) (Oral)   Ht '5\' 2"'  (1.575  m)   Wt 164 lb 9.6 oz (74.7 kg)   SpO2 100%   BMI 30.11 kg/m   BP/Weight 06/12/2017 03/12/2017 9/52/8413  Systolic BP 244 010 272  Diastolic BP 71 63 80  Wt. (Lbs) 164.6 164.7 173.8  BMI 30.11 30.12 33.38      Physical Exam  Constitutional: She is oriented to person, place, and time. She appears well-developed and well-nourished.  HENT:  Right Ear: External ear normal.  Left Ear: External ear normal.  Mildly erythematous oropharynx  Cardiovascular: Normal rate, normal heart sounds and intact distal pulses.   No murmur heard. Pulmonary/Chest: Effort normal and breath sounds normal. She has no wheezes. She has no rales. She exhibits no tenderness.  Abdominal: Soft. Bowel sounds are normal. She exhibits no distension and no mass. There is no tenderness.  Musculoskeletal: Normal range of motion.  Lymphadenopathy:    She has cervical adenopathy.  Neurological: She is alert and oriented to person, place, and time.    CMP Latest Ref Rng & Units 03/12/2017 12/12/2016 02/06/2016  Glucose 65 - 99 mg/dL 193(H) 125(H) 126(H)  BUN 6 - 24 mg/dL '7 11 10  ' Creatinine 0.57 - 1.00 mg/dL 0.52(L) 0.58 0.48(L)  Sodium 134 - 144 mmol/L 139 141 138  Potassium 3.5 - 5.2 mmol/L 4.0 4.1 4.1  Chloride 96 - 106 mmol/L 101 100 101  CO2 20 - 29 mmol/L '23 26 29  ' Calcium 8.7 - 10.2 mg/dL 8.9 9.0 9.2  Total Protein 6.0 - 8.5 g/dL 6.8 7.3 7.3  Total Bilirubin 0.0 - 1.2 mg/dL 0.8 0.9 1.1  Alkaline Phos 39 - 117 IU/L 162(H) 103 111  AST 0 - 40 IU/L '18 22 19  ' ALT 0 - 32 IU/L '24 23 26     ' Lipid Panel     Component Value Date/Time   CHOL 123 03/12/2017 1204   TRIG 86 03/12/2017 1204   HDL 36 (L) 03/12/2017 1204   CHOLHDL 3.4 03/12/2017 1204   CHOLHDL 5.2 (H) 02/06/2016 1557   VLDL 42 (H) 02/06/2016 1557   LDLCALC 70 03/12/2017 1204    Lab Results  Component Value Date   HGBA1C 10.6 06/12/2017    Assessment & Plan:   1. Type 2 diabetes mellitus without complication, without long-term current  use of insulin (HCC) Uncontrolled with A1c of 10.6 Advised to take glipizide in addition to metformin Diabetic diet Lifestyle medications - POCT glucose (manual entry) - POCT glycosylated hemoglobin (Hb A1C) - metFORMIN (GLUCOPHAGE) 1000 MG tablet; Take 1 tablet (1,000 mg total) by mouth 2 (two) times daily with a meal.  Dispense: 60 tablet; Refill: 5 - glipiZIDE (GLUCOTROL) 10 MG tablet; Take 1 tablet (10 mg total) by mouth 2 (two) times daily before a meal.  Dispense: 60 tablet; Refill: 5  2. Right-sided low back pain with right-sided sciatica, unspecified chronicity Controlled - tiZANidine (ZANAFLEX) 4 MG tablet; Take 1 tablet (4 mg total) by mouth every 8 (eight) hours as needed for muscle spasms.  Dispense: 60 tablet; Refill:  2  3. Pure hypercholesterolemia Controlled - atorvastatin (LIPITOR) 40 MG tablet; Take 1 tablet (40 mg total) by mouth daily.  Dispense: 30 tablet; Refill: 5  4. Other chronic sinusitis Acute on chronic exacerbation - fluticasone (FLONASE) 50 MCG/ACT nasal spray; Place 2 sprays into both nostrils daily.  Dispense: 16 g; Refill: 5  5. Acute maxillary sinusitis, recurrence not specified Acute on chronic sinusitis We'll place an antibiotic given prolonged duration of up to 2 weeks and premorbid medical condition-diabetes - amoxicillin (AMOXIL) 500 MG capsule; Take 1 capsule (500 mg total) by mouth 3 (three) times daily.  Dispense: 30 capsule; Refill: 0   Meds ordered this encounter  Medications  . tiZANidine (ZANAFLEX) 4 MG tablet    Sig: Take 1 tablet (4 mg total) by mouth every 8 (eight) hours as needed for muscle spasms.    Dispense:  60 tablet    Refill:  2  . atorvastatin (LIPITOR) 40 MG tablet    Sig: Take 1 tablet (40 mg total) by mouth daily.    Dispense:  30 tablet    Refill:  5  . fluticasone (FLONASE) 50 MCG/ACT nasal spray    Sig: Place 2 sprays into both nostrils daily.    Dispense:  16 g    Refill:  5  . metFORMIN (GLUCOPHAGE) 1000 MG  tablet    Sig: Take 1 tablet (1,000 mg total) by mouth 2 (two) times daily with a meal.    Dispense:  60 tablet    Refill:  5  . glipiZIDE (GLUCOTROL) 10 MG tablet    Sig: Take 1 tablet (10 mg total) by mouth 2 (two) times daily before a meal.    Dispense:  60 tablet    Refill:  5    Discontinue previous dose  . amoxicillin (AMOXIL) 500 MG capsule    Sig: Take 1 capsule (500 mg total) by mouth 3 (three) times daily.    Dispense:  30 capsule    Refill:  0    Follow-up: Return in about 3 months (around 09/12/2017) for follow up on diabetes mellitus.   Arnoldo Morale MD

## 2017-09-12 ENCOUNTER — Ambulatory Visit: Payer: Self-pay | Attending: Family Medicine | Admitting: Family Medicine

## 2017-09-12 ENCOUNTER — Encounter: Payer: Self-pay | Admitting: Family Medicine

## 2017-09-12 VITALS — BP 109/70 | HR 81 | Temp 97.9°F | Ht 62.0 in | Wt 167.0 lb

## 2017-09-12 DIAGNOSIS — M5441 Lumbago with sciatica, right side: Secondary | ICD-10-CM

## 2017-09-12 DIAGNOSIS — E119 Type 2 diabetes mellitus without complications: Secondary | ICD-10-CM

## 2017-09-12 DIAGNOSIS — Z79899 Other long term (current) drug therapy: Secondary | ICD-10-CM | POA: Insufficient documentation

## 2017-09-12 DIAGNOSIS — E785 Hyperlipidemia, unspecified: Secondary | ICD-10-CM | POA: Insufficient documentation

## 2017-09-12 DIAGNOSIS — G8929 Other chronic pain: Secondary | ICD-10-CM | POA: Insufficient documentation

## 2017-09-12 DIAGNOSIS — E7800 Pure hypercholesterolemia, unspecified: Secondary | ICD-10-CM

## 2017-09-12 DIAGNOSIS — J0100 Acute maxillary sinusitis, unspecified: Secondary | ICD-10-CM

## 2017-09-12 DIAGNOSIS — J328 Other chronic sinusitis: Secondary | ICD-10-CM

## 2017-09-12 DIAGNOSIS — J329 Chronic sinusitis, unspecified: Secondary | ICD-10-CM | POA: Insufficient documentation

## 2017-09-12 DIAGNOSIS — Z794 Long term (current) use of insulin: Secondary | ICD-10-CM | POA: Insufficient documentation

## 2017-09-12 LAB — POCT GLYCOSYLATED HEMOGLOBIN (HGB A1C): HEMOGLOBIN A1C: 10

## 2017-09-12 LAB — GLUCOSE, POCT (MANUAL RESULT ENTRY): POC Glucose: 223 mg/dl — AB (ref 70–99)

## 2017-09-12 MED ORDER — FLUTICASONE PROPIONATE 50 MCG/ACT NA SUSP: 2.0000 | Freq: Every day | NASAL | 5 refills | Status: AC

## 2017-09-12 MED ORDER — ATORVASTATIN CALCIUM 40 MG PO TABS: 40.0000 mg | ORAL_TABLET | Freq: Every day | ORAL | 5 refills | Status: DC

## 2017-09-12 MED ORDER — GLIPIZIDE 10 MG PO TABS: 10.0000 mg | ORAL_TABLET | Freq: Two times a day (BID) | ORAL | 5 refills | Status: DC

## 2017-09-12 MED ORDER — METFORMIN HCL 1000 MG PO TABS: 1000.0000 mg | ORAL_TABLET | Freq: Two times a day (BID) | ORAL | 5 refills | Status: DC

## 2017-09-12 MED ORDER — TIZANIDINE HCL 4 MG PO TABS: 4.0000 mg | ORAL_TABLET | Freq: Three times a day (TID) | ORAL | 2 refills | Status: DC | PRN

## 2017-09-12 MED ORDER — INSULIN GLARGINE 100 UNIT/ML SOLOSTAR PEN: 10.0000 [IU] | PEN_INJECTOR | Freq: Every day | SUBCUTANEOUS | 3 refills | Status: DC

## 2017-09-12 MED ORDER — AMOXICILLIN 500 MG PO CAPS: 500.0000 mg | ORAL_CAPSULE | Freq: Three times a day (TID) | ORAL | 0 refills | Status: DC

## 2017-09-12 NOTE — Patient Instructions (Signed)
Diabetes mellitus y nutrición  Diabetes Mellitus and Nutrition  Si sufre de diabetes (diabetes mellitus), es muy importante tener hábitos alimenticios saludables debido a que sus niveles de azúcar en la sangre (glucosa) se ven afectados en gran medida por lo que come y bebe. Comer alimentos saludables en las cantidades adecuadas, aproximadamente a la misma hora todos los días, lo ayudará a:  · Controlar la glucemia.  · Disminuir el riesgo de sufrir una enfermedad cardíaca.  · Mejorar la presión arterial.  · Alcanzar o mantener un peso saludable.    Todas las personas que sufren de diabetes son diferentes y cada una tiene necesidades diferentes en cuanto a un plan de alimentación. El médico puede recomendarle que trabaje con un especialista en dietas y nutrición (nutricionista) para elaborar el mejor plan para usted. Su plan de alimentación puede variar según factores como:  · Las calorías que necesita.  · Los medicamentos que toma.  · Su peso.  · Sus niveles de glucemia, presión arterial y colesterol.  · Su nivel de actividad.  · Otras afecciones que tenga, como enfermedades cardíacas o renales.    ¿Cómo me afectan los carbohidratos?  Los carbohidratos afectan el nivel de glucemia más que cualquier otro tipo de alimento. La ingesta de carbohidratos naturalmente aumenta la cantidad glucosa en la sangre. El recuento de carbohidratos es un método destinado a llevar un registro de la cantidad de carbohidratos que se ingieren. El recuento de carbohidratos es importante para mantener la glucemia a un nivel saludable, en especial si utiliza insulina o toma determinados medicamentos por vía oral para la diabetes.  Es importante saber la cantidad de carbohidratos que se pueden ingerir en cada comida sin correr ningún riesgo. Esto es diferente en cada persona. El nutricionista puede ayudarlo a calcular la cantidad de carbohidratos que debe ingerir en cada comida y colación.   Los alimentos que contienen carbohidratos incluyen:  · Pan, cereal, arroz, pasta y galletas.  · Papas y maíz.  · Guisantes, frijoles y lentejas.  · Leche y yogur.  · Frutas y jugo.  · Postres, como pasteles, galletitas, helado y caramelos.    ¿Cómo me afecta el alcohol?  El alcohol puede provocar disminuciones súbitas de la glucemia (hipoglucemia), en especial si utiliza insulina o toma determinados medicamentos por vía oral para la diabetes. La hipoglucemia es una afección potencialmente mortal. Los síntomas de la hipoglucemia (somnolencia, mareos y confusión) son similares a los síntomas de haber consumido demasiado alcohol.  Si el médico afirma que el alcohol es seguro para usted, siga estas pautas:  · Limite el consumo de alcohol a no más de 1 medida por día si es mujer y no está embarazada, y a 2 medidas si es hombre. Una medida equivale a 12 oz (355 ml) de cerveza, 5 oz (148 ml) de vino o 1½ oz (44 ml) de bebidas de alta graduación alcohólica.  · No beba con el estómago vacío.  · Manténgase hidratado con agua, gaseosas dietéticas o té helado sin azúcar.  · Tenga en cuenta que las gaseosas comunes, los jugos y otros refrescos pueden contener mucha azúcar y se deben contar como carbohidratos.    Consejos para seguir este plan  Leer las etiquetas de los alimentos  · Comience por controlar el tamaño de la porción en la etiqueta. La cantidad de calorías, carbohidratos, grasas y otros nutrientes mencionados en la etiqueta se basan en una porción del alimento. Muchos alimentos contienen más de una porción por envase.  · Verifique la cantidad total de gramos (g)   de carbohidratos totales en una porción. Puede calcular la cantidad de porciones de carbohidratos al dividir el total de carbohidratos por 15. Por ejemplo, si un alimento posee un total de 30 g de carbohidratos, equivale a 2 porciones de carbohidratos.  · Verifique la cantidad de gramos (g) de grasas saturadas y grasas trans  en una porción. Escoja alimentos que no contengan grasa o que tengan un bajo contenido.  · Controle la cantidad de miligramos (mg) de sodio en una porción. La mayoría de las personas deben limitar la ingesta de sodio total a menos de 2300 mg por día.  · Siempre consulte la información nutricional de los alimentos etiquetados como “con bajo contenido de grasa” o “sin grasa”. Estos alimentos pueden ser más altos en azúcar agregada o en carbohidratos refinados y deben evitarse.  · Hable con el nutricionista para identificar sus objetivos diarios en cuanto a los nutrientes mencionados en la etiqueta.  De compras  · Evite comprar alimentos procesados, enlatados o prehechos. Estos alimentos tienden a tener mayor cantidad de grasa, sodio y azúcar agregada.  · Compre en la zona exterior de la tienda de comestibles. Esta incluye frutas y vegetales frescos, granos a granel, carnes frescas y productos lácteos frescos.  Cocción  · Utilice métodos de cocción a baja temperatura, como hornear, en lugar de métodos de cocción a alta temperatura, como freír en abundante aceite.  · Cocine con aceites saludables, como el aceite de oliva, canola o girasol.  · Evite cocinar con manteca, crema o carnes con alto contenido de grasa.  Planificación de las comidas  · Consuma las comidas y las colaciones de forma regular, preferentemente a la misma hora todos los días. Evite pasar largos períodos de tiempo sin comer.  · Consuma alimentos ricos en fibra, como frutas frescas, verduras, frijoles y cereales integrales. Consulte al nutricionista sobre cuántas porciones de carbohidratos puede consumir en cada comida.  · Consuma entre 4 y 6 onzas de proteínas magras por día, como carnes magras, pollo, pescado, huevos o tofu. 1 onza equivale a 1 onza de carne, pollo o pescado, 1 huevo, o 1/4 taza de tofu.  · Coma algunos alimentos por día que contengan grasas saludables, como aguacates, frutos secos, semillas y pescado.  Estilo de vida     · Controle su nivel de glucemia con regularidad.  · Haga ejercicio al menos 30 minutos, 5 días o más por semana, o como se lo haya indicado el médico.  · Tome los medicamentos como se lo haya indicado el médico.  · No consuma ningún producto que contenga nicotina o tabaco, como cigarrillos y cigarrillos electrónicos. Si necesita ayuda para dejar de fumar, consulte al médico.  · Trabaje con un asesor o instructor en diabetes para identificar estrategias para controlar el estrés y cualquier desafío emocional y social.  ¿Cuáles son algunas de las preguntas que puedo hacerle a mi médico?  · ¿Es necesario que me reúna con un instructor en diabetes?  · ¿Es necesario que me reúna con un nutricionista?  · ¿A qué número puedo llamar si tengo preguntas?  · ¿Cuáles son los mejores momentos para controlar la glucemia?  Dónde encontrar más información:  · Asociación Americana de la Diabetes (American Diabetes Association): diabetes.org/food-and-fitness/food  · Academia de Nutrición y Dietética (Academy of Nutrition and Dietetics): www.eatright.org/resources/health/diseases-and-conditions/diabetes  · Instituto Nacional de la Diabetes y las Enfermedades Digestivas y Renales (National Institute of Diabetes and Digestive and Kidney Diseases) (Institutos Nacionales de Salud, NIH): www.niddk.nih.gov/health-information/diabetes/overview/diet-eating-physical-activity  Resumen  · Un plan de alimentación saludable   lo ayudará a controlar la glucemia y mantener un estilo de vida saludable.  · Trabajar con un especialista en dietas y nutrición (nutricionista) puede ayudarlo a elaborar el mejor plan de alimentación para usted.  · Tenga en cuenta que los carbohidratos y el alcohol tienen efectos inmediatos en sus niveles de glucemia. Es importante contar los carbohidratos y consumir alcohol con prudencia.  Esta información no tiene como fin reemplazar el consejo del médico. Asegúrese de hacerle al médico cualquier pregunta que tenga.   Document Released: 11/13/2007 Document Revised: 11/26/2016 Document Reviewed: 11/26/2016  Elsevier Interactive Patient Education © 2018 [employer] Inc.

## 2017-09-12 NOTE — Progress Notes (Signed)
Subjective:  Patient ID: Margaret Austin, female    DOB: [birth year]  Age: 62 y.o. MRN: [id number]  CC: Diabetes   HPI Margaret Austin  is a 62 year old female with a history of type 2 diabetes mellitus (A1c 10.0), hyperlipidemia chronic sinusitis, chronic low back pain who presents today for a follow-up visit.  She endorses compliance with her diabetic medications but her A1c has been hovering around 10.  She does not exercise much and compliance with a diabetic diet cannot be ascertained.  With regards to her hyperlipidemia she has been compliant with her statin and denies myalgias. She uses a muscle relaxant intermittently for low back pain.  Today she complains of a 2-week history of myalgias more pronounced in her upper extremities, nasal congestion, chills and tremors with tearing of her eyes.  She has also had postnasal drip and sinus pressure. She has used over-the-counter decongestants with no improvement.  Past Medical History:  Diagnosis Date  . Diabetes mellitus without complication ([place])     History reviewed. No pertinent surgical history.  No Known Allergies   Outpatient Medications Prior to Visit  Medication Sig Dispense Refill  . Blood Glucose Monitoring Suppl (TRUE METRIX METER) w/Device KIT Used as directed, 3 times daily. 1 kit 0  . docusate sodium (RA COL-RITE) 100 MG capsule Take 100 mg by mouth daily. Reported on 01/09/2016    . glucose blood (TRUE METRIX BLOOD GLUCOSE TEST) test strip Use as instructed, 3 times daily 100 each 12  . meloxicam (MOBIC) 7.5 MG tablet Take 1 tablet (7.5 mg total) by mouth daily. 30 tablet 2  . TRUEPLUS LANCETS 28G MISC Check blood sugar TID 100 each 12  . atorvastatin (LIPITOR) 40 MG tablet Take 1 tablet (40 mg total) by mouth daily. 30 tablet 5  . fluticasone (FLONASE) 50 MCG/ACT nasal spray Place 2 sprays into both nostrils daily. 16 g 5  . glipiZIDE (GLUCOTROL) 10 MG tablet Take 1 tablet (10 mg total) by mouth 2 (two)  times daily before a meal. 60 tablet 5  . metFORMIN (GLUCOPHAGE) 1000 MG tablet Take 1 tablet (1,000 mg total) by mouth 2 (two) times daily with a meal. 60 tablet 5  . tiZANidine (ZANAFLEX) 4 MG tablet Take 1 tablet (4 mg total) by mouth every 8 (eight) hours as needed for muscle spasms. 60 tablet 2  . cetirizine (ZYRTEC) 10 MG tablet Take 1 tablet (10 mg total) by mouth daily. (Patient not taking: Reported on 06/12/2017) 30 tablet 5  . hydrocortisone 2.5 % cream Apply topically 2 (two) times daily. (Patient not taking: Reported on 06/12/2017) 30 g 0  . Insulin Pen Needle (ULTICARE PEN NEEDLES) 29G X 12.7MM MISC 15 Units by Does not apply route at bedtime. (Patient not taking: Reported on 06/12/2017) 1 each 2  . olopatadine (PATANOL) 0.1 % ophthalmic solution Place 1 drop into both eyes 2 (two) times daily. (Patient not taking: Reported on 06/12/2017) 5 mL 1  . amoxicillin (AMOXIL) 500 MG capsule Take 1 capsule (500 mg total) by mouth 3 (three) times daily. (Patient not taking: Reported on 09/12/2017) 30 capsule 0   No facility-administered medications prior to visit.     ROS Review of Systems  Constitutional: Positive for chills. Negative for activity change, appetite change and fatigue.  HENT: Positive for congestion, postnasal drip and sinus pain. Negative for sinus pressure and sore throat.   Eyes: Negative for visual disturbance.  Respiratory: Negative for cough, chest tightness, shortness of breath  and wheezing.   Cardiovascular: Negative for chest pain and palpitations.  Gastrointestinal: Negative for abdominal distention, abdominal pain and constipation.  Endocrine: Negative for polydipsia.  Genitourinary: Negative for dysuria and frequency.  Musculoskeletal: Negative for arthralgias and back pain.  Skin: Negative for rash.  Neurological: Negative for tremors, light-headedness and numbness.  Hematological: Does not bruise/bleed easily.  Psychiatric/Behavioral: Negative for agitation  and behavioral problems.    Objective:  BP 109/70   Pulse 81   Temp 97.9 F (36.6 C) (Oral)   Ht '5\' 2"'  (1.575 m)   Wt 167 lb (75.8 kg)   SpO2 97%   BMI 30.54 kg/m   BP/Weight 09/12/2017 06/12/2017 5/36/6440  Systolic BP 347 425 956  Diastolic BP 70 71 63  Wt. (Lbs) 167 164.6 164.7  BMI 30.54 30.11 30.12      Physical Exam  Constitutional: She is oriented to person, place, and time. She appears well-developed and well-nourished.  Cardiovascular: Normal rate, normal heart sounds and intact distal pulses.  No murmur heard. Pulmonary/Chest: Effort normal and breath sounds normal. She has no wheezes. She has no rales. She exhibits no tenderness.  Abdominal: Soft. Bowel sounds are normal. She exhibits no distension and no mass. There is no tenderness.  Musculoskeletal: Normal range of motion.  Neurological: She is alert and oriented to person, place, and time.  Skin: Skin is warm and dry.  Psychiatric: She has a normal mood and affect.    CMP Latest Ref Rng & Units 03/12/2017 12/12/2016 02/06/2016  Glucose 65 - 99 mg/dL 193(H) 125(H) 126(H)  BUN 6 - 24 mg/dL '7 11 10  ' Creatinine 0.57 - 1.00 mg/dL 0.52(L) 0.58 0.48(L)  Sodium 134 - 144 mmol/L 139 141 138  Potassium 3.5 - 5.2 mmol/L 4.0 4.1 4.1  Chloride 96 - 106 mmol/L 101 100 101  CO2 20 - 29 mmol/L '23 26 29  ' Calcium 8.7 - 10.2 mg/dL 8.9 9.0 9.2  Total Protein 6.0 - 8.5 g/dL 6.8 7.3 7.3  Total Bilirubin 0.0 - 1.2 mg/dL 0.8 0.9 1.1  Alkaline Phos 39 - 117 IU/L 162(H) 103 111  AST 0 - 40 IU/L '18 22 19  ' ALT 0 - 32 IU/L '24 23 26    ' Lipid Panel     Component Value Date/Time   CHOL 123 03/12/2017 1204   TRIG 86 03/12/2017 1204   HDL 36 (L) 03/12/2017 1204   CHOLHDL 3.4 03/12/2017 1204   CHOLHDL 5.2 (H) 02/06/2016 1557   VLDL 42 (H) 02/06/2016 1557   LDLCALC 70 03/12/2017 1204    Lab Results  Component Value Date   HGBA1C 10.0 09/12/2017     Assessment & Plan:   1. Type 2 diabetes mellitus without complication,  without long-term current use of insulin (HCC) Uncontrolled with A1c of 10.0 Lantus added to regimen Counseled on Diabetic diet, my plate method, 387 minutes of moderate intensity exercise/week Keep blood sugar logs with fasting goals of 80-120 mg/dl, random of less than 180 and in the event of sugars less than 60 mg/dl or greater than 400 mg/dl please notify the clinic ASAP. It is recommended that you undergo annual eye exams and annual foot exams. Pneumonia vaccine is recommended. - POCT glucose (manual entry) - POCT glycosylated hemoglobin (Hb A1C) - Insulin Glargine (LANTUS SOLOSTAR) 100 UNIT/ML Solostar Pen; Inject 10 Units into the skin daily at 10 pm.  Dispense: 3 pen; Refill: 3 - metFORMIN (GLUCOPHAGE) 1000 MG tablet; Take 1 tablet (1,000 mg total) by mouth 2 (  two) times daily with a meal.  Dispense: 60 tablet; Refill: 5 - glipiZIDE (GLUCOTROL) 10 MG tablet; Take 1 tablet (10 mg total) by mouth 2 (two) times daily before a meal.  Dispense: 60 tablet; Refill: 5 - Basic Metabolic Panel  2. Right-sided low back pain with right-sided sciatica, unspecified chronicity Stable - tiZANidine (ZANAFLEX) 4 MG tablet; Take 1 tablet (4 mg total) by mouth every 8 (eight) hours as needed for muscle spasms.  Dispense: 60 tablet; Refill: 2  3. Other chronic sinusitis Acute on chronic sinusitis - fluticasone (FLONASE) 50 MCG/ACT nasal spray; Place 2 sprays into both nostrils daily.  Dispense: 16 g; Refill: 5  4. Pure hypercholesterolemia Controlled - atorvastatin (LIPITOR) 40 MG tablet; Take 1 tablet (40 mg total) by mouth daily.  Dispense: 30 tablet; Refill: 5  5. Acute maxillary sinusitis, recurrence not specified - amoxicillin (AMOXIL) 500 MG capsule; Take 1 capsule (500 mg total) by mouth 3 (three) times daily.  Dispense: 30 capsule; Refill: 0   Meds ordered this encounter  Medications  . Insulin Glargine (LANTUS SOLOSTAR) 100 UNIT/ML Solostar Pen    Sig: Inject 10 Units into the skin daily  at 10 pm.    Dispense:  3 pen    Refill:  3  . tiZANidine (ZANAFLEX) 4 MG tablet    Sig: Take 1 tablet (4 mg total) by mouth every 8 (eight) hours as needed for muscle spasms.    Dispense:  60 tablet    Refill:  2  . metFORMIN (GLUCOPHAGE) 1000 MG tablet    Sig: Take 1 tablet (1,000 mg total) by mouth 2 (two) times daily with a meal.    Dispense:  60 tablet    Refill:  5  . glipiZIDE (GLUCOTROL) 10 MG tablet    Sig: Take 1 tablet (10 mg total) by mouth 2 (two) times daily before a meal.    Dispense:  60 tablet    Refill:  5  . fluticasone (FLONASE) 50 MCG/ACT nasal spray    Sig: Place 2 sprays into both nostrils daily.    Dispense:  16 g    Refill:  5  . atorvastatin (LIPITOR) 40 MG tablet    Sig: Take 1 tablet (40 mg total) by mouth daily.    Dispense:  30 tablet    Refill:  5  . amoxicillin (AMOXIL) 500 MG capsule    Sig: Take 1 capsule (500 mg total) by mouth 3 (three) times daily.    Dispense:  30 capsule    Refill:  0    Follow-up: Return in about 3 months (around 12/11/2017) for follow up on diabetes mellitus.   Charlott Rakes MD

## 2017-09-13 LAB — BASIC METABOLIC PANEL
BUN / CREAT RATIO: 16 (ref 9–23)
BUN: 10 mg/dL (ref 6–24)
CO2: 26 mmol/L (ref 20–29)
CREATININE: 0.61 mg/dL (ref 0.57–1.00)
Calcium: 9.4 mg/dL (ref 8.7–10.2)
Chloride: 99 mmol/L (ref 96–106)
GFR calc Af Amer: 115 mL/min/{1.73_m2} (ref >59)
GFR, EST NON AFRICAN AMERICAN: 100 mL/min/{1.73_m2} (ref >59)
GLUCOSE: 230 mg/dL — AB (ref 65–99)
Potassium: 4.7 mmol/L (ref 3.5–5.2)
Sodium: 140 mmol/L (ref 134–144)

## 2017-09-19 ENCOUNTER — Telehealth: Payer: Self-pay

## 2017-09-19 ENCOUNTER — Telehealth: Payer: Self-pay | Admitting: Family Medicine

## 2017-09-19 MED ORDER — INSULIN PEN NEEDLE 29G X 12.7MM MISC: 2 refills | Status: DC

## 2017-09-19 NOTE — Telephone Encounter (Signed)
Pen needles sent to pharmacy.

## 2017-09-19 NOTE — Addendum Note (Signed)
Addended by: Rica Mast on: 09/19/2017 03:58 PM   Modules accepted: Orders

## 2017-09-19 NOTE — Telephone Encounter (Signed)
Pt called to request refill on insulin needle because they refilled her insulin but not her needles.  please follow up

## 2017-09-19 NOTE — Telephone Encounter (Signed)
Patient was called and informed of lab results via interpretor. 

## 2017-09-26 ENCOUNTER — Encounter (HOSPITAL_COMMUNITY): Payer: Self-pay | Admitting: Emergency Medicine

## 2017-09-26 ENCOUNTER — Other Ambulatory Visit: Payer: Self-pay

## 2017-09-26 ENCOUNTER — Emergency Department (HOSPITAL_COMMUNITY): Payer: Self-pay

## 2017-09-26 DIAGNOSIS — E119 Type 2 diabetes mellitus without complications: Secondary | ICD-10-CM | POA: Insufficient documentation

## 2017-09-26 DIAGNOSIS — Z794 Long term (current) use of insulin: Secondary | ICD-10-CM | POA: Insufficient documentation

## 2017-09-26 DIAGNOSIS — B349 Viral infection, unspecified: Secondary | ICD-10-CM | POA: Insufficient documentation

## 2017-09-26 LAB — CBC
HCT: 37.7 % (ref 36.0–46.0)
Hemoglobin: 12.5 g/dL (ref 12.0–15.0)
MCH: 29.8 pg (ref 26.0–34.0)
MCHC: 33.2 g/dL (ref 30.0–36.0)
MCV: 90 fL (ref 78.0–100.0)
Platelets: 309 K/uL (ref 150–400)
RBC: 4.19 MIL/uL (ref 3.87–5.11)
RDW: 13 % (ref 11.5–15.5)
WBC: 8.7 K/uL (ref 4.0–10.5)

## 2017-09-26 LAB — BASIC METABOLIC PANEL
Anion gap: 11 (ref 5–15)
BUN: 9 mg/dL (ref 6–20)
CHLORIDE: 105 mmol/L (ref 101–111)
CO2: 21 mmol/L — AB (ref 22–32)
CREATININE: 0.52 mg/dL (ref 0.44–1.00)
Calcium: 8.6 mg/dL — ABNORMAL LOW (ref 8.9–10.3)
GFR calc non Af Amer: >60 mL/min (ref >60)
GLUCOSE: 181 mg/dL — AB (ref 65–99)
Potassium: 3.6 mmol/L (ref 3.5–5.1)
Sodium: 137 mmol/L (ref 135–145)

## 2017-09-26 LAB — I-STAT BETA HCG BLOOD, ED (MC, WL, AP ONLY): I-stat hCG, quantitative: <5 m[IU]/mL

## 2017-09-26 LAB — CBG MONITORING, ED: GLUCOSE-CAPILLARY: 156 mg/dL — AB (ref 65–99)

## 2017-09-26 LAB — I-STAT TROPONIN, ED: Troponin i, poc: 0 ng/mL (ref 0.00–0.08)

## 2017-09-26 NOTE — ED Triage Notes (Signed)
Pt c/o headache, nasal congestion and chest pain that radiates to the left shoulder x 2 days. Pt has been using flonase with no relief. Denies nausea/vomiting.

## 2017-09-27 ENCOUNTER — Emergency Department (HOSPITAL_COMMUNITY)
Admission: EM | Admit: 2017-09-27 | Discharge: 2017-09-27 | Disposition: A | Discharge status: Home / Self Care | Payer: Self-pay | Attending: Emergency Medicine | Admitting: Emergency Medicine

## 2017-09-27 DIAGNOSIS — B349 Viral infection, unspecified: Secondary | ICD-10-CM

## 2017-09-27 LAB — I-STAT TROPONIN, ED: Troponin i, poc: 0.01 ng/mL (ref 0.00–0.08)

## 2017-09-27 MED ORDER — BENZONATATE 100 MG PO CAPS: 100.0000 mg | ORAL_CAPSULE | Freq: Three times a day (TID) | ORAL | 0 refills | Status: DC

## 2017-09-27 MED ORDER — OSELTAMIVIR PHOSPHATE 75 MG PO CAPS: 75.0000 mg | ORAL_CAPSULE | Freq: Two times a day (BID) | ORAL | 0 refills | Status: DC

## 2017-09-27 MED ORDER — IBUPROFEN 600 MG PO TABS: 600.0000 mg | ORAL_TABLET | Freq: Four times a day (QID) | ORAL | 0 refills | Status: DC | PRN

## 2017-09-27 NOTE — ED Notes (Signed)
Called x 1 with no answer. 

## 2017-09-27 NOTE — ED Provider Notes (Signed)
[facility] DEPART[facility] Provider Note   CSN: [id number] Arrival date & time: 09/26/17  1821     History   Chief Complaint Chief Complaint  Patient presents with  . Chest Pain  . Nasal Congestion    HPI Margaret Austin is a 62 y.o. female.  HPI   62 year old female with history of diabetes, obesity, GERD presenting complaining of cold symptoms.  Patient is Spanish-speaking.  History obtained through language interpreter.  For the past 2-3 days patient has had subjective fever, chills, body aches, earaches, sinus congestion, nonproductive cough, chest congestion, decrease in appetite.  She has been using Flonase without adequate relief.  She did not had a flu shot.  She denies any recent travel.  Denies vomiting or diarrhea or dysuria.  She is a non-smoker.  Past Medical History:  Diagnosis Date  . Diabetes mellitus without complication [facility])     Patient Active Problem List   Diagnosis Date Noted  . Osteoarthritis 12/07/2016  . Hyperlipidemia 02/07/2016  . Obesity 02/06/2016  . Low back pain with right-sided sciatica 01/09/2016  . Diabetes ([place]) 09/15/2013  . Rash 09/15/2013  . GERD (gastroesophageal reflux disease) 09/15/2013    History reviewed. No pertinent surgical history.  OB History    No data available       Home Medications    Prior to Admission medications   Medication Sig Start Date End Date Taking? Authorizing Provider  amoxicillin (AMOXIL) 500 MG capsule Take 1 capsule (500 mg total) by mouth 3 (three) times daily. 09/12/17   Charlott Rakes, MD  atorvastatin (LIPITOR) 40 MG tablet Take 1 tablet (40 mg total) by mouth daily. 09/12/17   Charlott Rakes, MD  Blood Glucose Monitoring Suppl (TRUE METRIX METER) w/Device KIT Used as directed, 3 times daily. 02/06/16   Charlott Rakes, MD  cetirizine (ZYRTEC) 10 MG tablet Take 1 tablet (10 mg total) by mouth daily. Patient not taking: Reported on 06/12/2017 03/12/17   Charlott Rakes, MD  docusate sodium (RA COL-RITE) 100 MG capsule Take 100 mg by mouth daily. Reported on 01/09/2016    [provider]  fluticasone (FLONASE) 50 MCG/ACT nasal spray Place 2 sprays into both nostrils daily. 09/12/17   Charlott Rakes, MD  glipiZIDE (GLUCOTROL) 10 MG tablet Take 1 tablet (10 mg total) by mouth 2 (two) times daily before a meal. 09/12/17   Charlott Rakes, MD  glucose blood (TRUE METRIX BLOOD GLUCOSE TEST) test strip Use as instructed, 3 times daily 02/06/16   Charlott Rakes, MD  hydrocortisone 2.5 % cream Apply topically 2 (two) times daily. Patient not taking: Reported on 06/12/2017 03/12/17   Charlott Rakes, MD  Insulin Glargine (LANTUS SOLOSTAR) 100 UNIT/ML Solostar Pen Inject 10 Units into the skin daily at 10 pm. 09/12/17   Charlott Rakes, MD  Insulin Pen Needle (ULTICARE PEN NEEDLES) 29G X 12.7MM MISC Use as directed 09/19/17   Charlott Rakes, MD  meloxicam (MOBIC) 7.5 MG tablet Take 1 tablet (7.5 mg total) by mouth daily. 03/12/17   Charlott Rakes, MD  metFORMIN (GLUCOPHAGE) 1000 MG tablet Take 1 tablet (1,000 mg total) by mouth 2 (two) times daily with a meal. 09/12/17   Newlin, Enobong, MD  olopatadine (PATANOL) 0.1 % ophthalmic solution Place 1 drop into both eyes 2 (two) times daily. Patient not taking: Reported on 06/12/2017 03/12/17   Charlott Rakes, MD  tiZANidine (ZANAFLEX) 4 MG tablet Take 1 tablet (4 mg total) by mouth every 8 (eight) hours as needed  for muscle spasms. 09/12/17   Charlott Rakes, MD  TRUEPLUS LANCETS 28G MISC Check blood sugar TID 02/06/16   Charlott Rakes, MD    Family History No family history on file.  Social History Social History   Tobacco Use  . Smoking status: Never Smoker  . Smokeless tobacco: Never Used  Substance Use Topics  . Alcohol use: No  . Drug use: No     Allergies   Patient has no known allergies.   Review of Systems Review of Systems  All other systems reviewed and are negative.    Physical  Exam Updated Vital Signs BP (!) 122/59 (BP Location: Right Arm)   Pulse 64   Temp 97.9 F (36.6 C) (Oral)   Resp 20   SpO2 98%   Physical Exam  Constitutional: She appears well-developed and well-nourished. No distress.  HENT:  Head: Atraumatic.  Ears: Normal TMs bilaterally Nose: Rhinorrhea Throat: Uvula midline no tonsillar enlargement or exudate  Eyes: Conjunctivae are normal.  Neck: Neck supple.  Cardiovascular: Normal rate, regular rhythm, intact distal pulses and normal pulses.  Pulmonary/Chest: Effort normal and breath sounds normal. No stridor. She has no decreased breath sounds.  Abdominal: Soft. There is no tenderness.  Neurological: She is alert.  Skin: No rash noted.  Psychiatric: She has a normal mood and affect.  Nursing note and vitals reviewed.    ED Treatments / Results  Labs (all labs ordered are listed, but only abnormal results are displayed) Labs Reviewed  BASIC METABOLIC PANEL - Abnormal; Notable for the following components:      Result Value   CO2 21 (*)    Glucose, Bld 181 (*)    Calcium 8.6 (*)    All other components within normal limits  CBG MONITORING, ED - Abnormal; Notable for the following components:   Glucose-Capillary 156 (*)    All other components within normal limits  CBC  I-STAT TROPONIN, ED  I-STAT BETA HCG BLOOD, ED ([facility], [facility], [facility] ONLY)  I-STAT TROPONIN, ED    EKG  EKG Interpretation None      Date: 09/27/2017  Rate: 75  Rhythm: normal sinus rhythm  QRS Axis: normal  Intervals: normal  ST/T Wave abnormalities: normal  Conduction Disutrbances: none  Narrative Interpretation:   Old EKG Reviewed: No significant changes noted     Radiology Dg Chest 2 View  Result Date: 09/26/2017 CLINICAL DATA:  Shortness of breath with fever EXAM: CHEST  2 VIEW COMPARISON:  09/04/2014 FINDINGS: Mild bronchitic changes. No consolidation or effusion. Normal cardiomediastinal silhouette. No pneumothorax. Degenerative changes of the  spine. IMPRESSION: No active cardiopulmonary disease.  Mild bronchitic changes Electronically Signed   By: Donavan Foil M.D.   On: 09/26/2017 20:29    Procedures Procedures (including critical care time)  Medications Ordered in ED Medications - No data to display   Initial Impression / Assessment and Plan / ED Course  I have reviewed the triage vital signs and the nursing notes.  Pertinent labs & imaging results that were available during my care of the patient were reviewed by me and considered in my medical decision making (see chart for details).     BP (!) 122/59 (BP Location: Right Arm)   Pulse 64   Temp 97.9 F (36.6 C) (Oral)   Resp 20   SpO2 98%    Final Clinical Impressions(s) / ED Diagnoses   Final diagnoses:  Viral illness    ED Discharge Orders  Ordered    oseltamivir (TAMIFLU) 75 MG capsule  Every 12 hours     09/27/17 0756    ibuprofen (ADVIL,MOTRIN) 600 MG tablet  Every 6 hours PRN     09/27/17 0756    benzonatate (TESSALON) 100 MG capsule  Every 8 hours     09/27/17 0756     Patient here with symptoms consistent with influenza.  Symptoms not consistent with ACS.  She has a negative delta troponin and EKG is unremarkable.  Chest x-ray shows mild bronchitic changes.  Patient will be treated with Tamiflu, recommend hydration, and taking ibuprofen as needed for pain.  Cough suppressant prescribed.  Return precautions discussed.    Domenic Moras, PA-C 09/27/17 5486    Jola Schmidt, MD 09/28/17 1027

## 2017-12-09 ENCOUNTER — Ambulatory Visit: Payer: Self-pay

## 2017-12-11 ENCOUNTER — Ambulatory Visit: Payer: Self-pay | Attending: Family Medicine | Admitting: Family Medicine

## 2017-12-11 ENCOUNTER — Encounter: Payer: Self-pay | Admitting: Family Medicine

## 2017-12-11 VITALS — BP 114/71 | HR 79 | Temp 97.6°F | Ht 62.0 in | Wt 174.4 lb

## 2017-12-11 DIAGNOSIS — R200 Anesthesia of skin: Secondary | ICD-10-CM | POA: Insufficient documentation

## 2017-12-11 DIAGNOSIS — K5909 Other constipation: Secondary | ICD-10-CM

## 2017-12-11 DIAGNOSIS — E7800 Pure hypercholesterolemia, unspecified: Secondary | ICD-10-CM | POA: Insufficient documentation

## 2017-12-11 DIAGNOSIS — M7989 Other specified soft tissue disorders: Secondary | ICD-10-CM | POA: Insufficient documentation

## 2017-12-11 DIAGNOSIS — K5900 Constipation, unspecified: Secondary | ICD-10-CM | POA: Insufficient documentation

## 2017-12-11 DIAGNOSIS — J329 Chronic sinusitis, unspecified: Secondary | ICD-10-CM | POA: Insufficient documentation

## 2017-12-11 DIAGNOSIS — E119 Type 2 diabetes mellitus without complications: Secondary | ICD-10-CM | POA: Insufficient documentation

## 2017-12-11 DIAGNOSIS — M79602 Pain in left arm: Secondary | ICD-10-CM

## 2017-12-11 DIAGNOSIS — M5441 Lumbago with sciatica, right side: Secondary | ICD-10-CM | POA: Insufficient documentation

## 2017-12-11 DIAGNOSIS — E785 Hyperlipidemia, unspecified: Secondary | ICD-10-CM | POA: Insufficient documentation

## 2017-12-11 DIAGNOSIS — M79601 Pain in right arm: Secondary | ICD-10-CM

## 2017-12-11 DIAGNOSIS — Z794 Long term (current) use of insulin: Secondary | ICD-10-CM | POA: Insufficient documentation

## 2017-12-11 DIAGNOSIS — Z79899 Other long term (current) drug therapy: Secondary | ICD-10-CM | POA: Insufficient documentation

## 2017-12-11 DIAGNOSIS — G8929 Other chronic pain: Secondary | ICD-10-CM | POA: Insufficient documentation

## 2017-12-11 LAB — GLUCOSE, POCT (MANUAL RESULT ENTRY): POC Glucose: 295 mg/dl — AB (ref 70–99)

## 2017-12-11 LAB — POCT GLYCOSYLATED HEMOGLOBIN (HGB A1C): HEMOGLOBIN A1C: 9

## 2017-12-11 MED ORDER — TRAMADOL HCL 50 MG PO TABS: 50.0000 mg | ORAL_TABLET | Freq: Every evening | ORAL | 0 refills | Status: DC | PRN

## 2017-12-11 MED ORDER — POLYETHYLENE GLYCOL 3350 17 GM/SCOOP PO POWD: 17.0000 g | Freq: Every day | ORAL | 1 refills | Status: DC

## 2017-12-11 MED ORDER — TIZANIDINE HCL 4 MG PO TABS: 4.0000 mg | ORAL_TABLET | Freq: Three times a day (TID) | ORAL | 2 refills | Status: DC | PRN

## 2017-12-11 MED ORDER — METFORMIN HCL 1000 MG PO TABS: 1000.0000 mg | ORAL_TABLET | Freq: Two times a day (BID) | ORAL | 5 refills | Status: DC

## 2017-12-11 MED ORDER — INSULIN PEN NEEDLE 29G X 12.7MM MISC: 2 refills | Status: DC

## 2017-12-11 MED ORDER — GLIPIZIDE 10 MG PO TABS: 10.0000 mg | ORAL_TABLET | Freq: Two times a day (BID) | ORAL | 5 refills | Status: DC

## 2017-12-11 MED ORDER — ATORVASTATIN CALCIUM 40 MG PO TABS: 40.0000 mg | ORAL_TABLET | Freq: Every day | ORAL | 5 refills | Status: DC

## 2017-12-11 MED ORDER — MELOXICAM 7.5 MG PO TABS: 7.5000 mg | ORAL_TABLET | Freq: Every day | ORAL | 2 refills | Status: DC

## 2017-12-11 MED ORDER — INSULIN GLARGINE 100 UNIT/ML SOLOSTAR PEN: 20.0000 [IU] | PEN_INJECTOR | Freq: Every day | SUBCUTANEOUS | 3 refills | Status: DC

## 2017-12-11 NOTE — Progress Notes (Signed)
Patient would like medication for constipation.

## 2017-12-11 NOTE — Progress Notes (Signed)
Subjective:  Patient ID: Margaret Austin, female    DOB: [birth year]  Age: 62 y.o. MRN: [id number]  CC: Diabetes   HPI Margaret Austin  is a 62 year old female with a history of type 2 diabetes mellitus (A1c 10.0), hyperlipidemia chronic sinusitis, chronic low back pain who presents today for a follow-up visit.  A1c is 9.0 which is slightly down from 10.0 and she endorses compliance with her medications, denies hypoglycemia, numbness in extremities, visual concerns.  Compliance with a diabetic diet cannot be ascertained. Her sinus symptoms are controlled on her current regimen and her back pain is doing well on her muscle relaxer. She complains of bilateral pain, right greater than left for the last 8 days and denies preceding history of trauma or heavy lifting.  Pain occurs in her shoulder, upper arm muscles and forearm muscles are swollen and sometimes gets to an 8/10 at its worst.  There are no relieving or aggravating factors.  She denies numbness in her hands. Currently taking meloxicam on a muscle relaxant with no relief in symptoms. She complains of constipation and having to strain to move her bowels; denies presence of hematochezia, abdominal pain, nausea or vomiting.  Past Medical History:  Diagnosis Date  . Diabetes mellitus without complication ([place])     History reviewed. No pertinent surgical history.  No Known Allergies   Outpatient Medications Prior to Visit  Medication Sig Dispense Refill  . Insulin Glargine (LANTUS SOLOSTAR) 100 UNIT/ML Solostar Pen Inject 10 Units into the skin daily at 10 pm. 3 pen 3  . meloxicam (MOBIC) 7.5 MG tablet Take 1 tablet (7.5 mg total) by mouth daily. 30 tablet 2  . metFORMIN (GLUCOPHAGE) 1000 MG tablet Take 1 tablet (1,000 mg total) by mouth 2 (two) times daily with a meal. 60 tablet 5  . tiZANidine (ZANAFLEX) 4 MG tablet Take 1 tablet (4 mg total) by mouth every 8 (eight) hours as needed for muscle spasms. 60 tablet 2  . Blood  Glucose Monitoring Suppl (TRUE METRIX METER) w/Device KIT Used as directed, 3 times daily. (Patient not taking: Reported on 12/11/2017) 1 kit 0  . docusate sodium (RA COL-RITE) 100 MG capsule Take 100 mg by mouth daily. Reported on 01/09/2016    . fluticasone (FLONASE) 50 MCG/ACT nasal spray Place 2 sprays into both nostrils daily. (Patient not taking: Reported on 12/11/2017) 16 g 5  . glucose blood (TRUE METRIX BLOOD GLUCOSE TEST) test strip Use as instructed, 3 times daily (Patient not taking: Reported on 12/11/2017) 100 each 12  . hydrocortisone 2.5 % cream Apply topically 2 (two) times daily. (Patient not taking: Reported on 06/12/2017) 30 g 0  . ibuprofen (ADVIL,MOTRIN) 600 MG tablet Take 1 tablet (600 mg total) by mouth every 6 (six) hours as needed. (Patient not taking: Reported on 12/11/2017) 30 tablet 0  . olopatadine (PATANOL) 0.1 % ophthalmic solution Place 1 drop into both eyes 2 (two) times daily. (Patient not taking: Reported on 06/12/2017) 5 mL 1  . TRUEPLUS LANCETS 28G MISC Check blood sugar TID (Patient not taking: Reported on 12/11/2017) 100 each 12  . amoxicillin (AMOXIL) 500 MG capsule Take 1 capsule (500 mg total) by mouth 3 (three) times daily. (Patient not taking: Reported on 12/11/2017) 30 capsule 0  . atorvastatin (LIPITOR) 40 MG tablet Take 1 tablet (40 mg total) by mouth daily. (Patient not taking: Reported on 12/11/2017) 30 tablet 5  . benzonatate (TESSALON) 100 MG capsule Take 1 capsule (100 mg total) by  mouth every 8 (eight) hours. (Patient not taking: Reported on 12/11/2017) 21 capsule 0  . cetirizine (ZYRTEC) 10 MG tablet Take 1 tablet (10 mg total) by mouth daily. (Patient not taking: Reported on 06/12/2017) 30 tablet 5  . glipiZIDE (GLUCOTROL) 10 MG tablet Take 1 tablet (10 mg total) by mouth 2 (two) times daily before a meal. (Patient not taking: Reported on 12/11/2017) 60 tablet 5  . Insulin Pen Needle (ULTICARE PEN NEEDLES) 29G X 12.7MM MISC Use as directed (Patient not  taking: Reported on 12/11/2017) 100 each 2  . oseltamivir (TAMIFLU) 75 MG capsule Take 1 capsule (75 mg total) by mouth every 12 (twelve) hours. (Patient not taking: Reported on 12/11/2017) 10 capsule 0   No facility-administered medications prior to visit.     ROS Review of Systems  Constitutional: Negative for activity change, appetite change and fatigue.  HENT: Negative for congestion, sinus pressure and sore throat.   Eyes: Negative for visual disturbance.  Respiratory: Negative for cough, chest tightness, shortness of breath and wheezing.   Cardiovascular: Negative for chest pain and palpitations.  Gastrointestinal: Positive for constipation. Negative for abdominal distention and abdominal pain.  Endocrine: Negative for polydipsia.  Genitourinary: Negative for dysuria and frequency.  Musculoskeletal:       See hpi  Skin: Negative for rash.  Neurological: Negative for tremors, light-headedness and numbness.  Hematological: Does not bruise/bleed easily.  Psychiatric/Behavioral: Negative for agitation and behavioral problems.    Objective:  BP 114/71   Pulse 79   Temp 97.6 F (36.4 C) (Oral)   Ht _0  (1.575 m)   Wt 174 lb 6.4 oz (79.1 kg)   SpO2 98%   BMI 31.90 kg/m   BP/Weight 12/11/2017 09/27/2017 5/72/6203  Systolic BP 559 741 638  Diastolic BP 71 59 70  Wt. (Lbs) 174.4 - 167  BMI 31.9 - 30.54      Physical Exam  Constitutional: She is oriented to person, place, and time. She appears well-developed and well-nourished.  Cardiovascular: Normal rate, normal heart sounds and intact distal pulses.  No murmur heard. Pulmonary/Chest: Effort normal and breath sounds normal. She has no wheezes. She has no rales. She exhibits no tenderness.  Abdominal: Soft. Bowel sounds are normal. She exhibits no distension and no mass. There is no tenderness.  Musculoskeletal: Normal range of motion. She exhibits no tenderness (No TTP of muscle groups or ROM of upper extremities).    Neurological: She is alert and oriented to person, place, and time.  Skin: Skin is warm and dry.  Psychiatric: She has a normal mood and affect.    CMP Latest Ref Rng & Units 09/26/2017 09/12/2017 03/12/2017  Glucose 65 - 99 mg/dL 181(H) 230(H) 193(H)  BUN 6 - 20 mg/dL _1 Creatinine 0.44 - 1.00 mg/dL 0.52 0.61 0.52(L)  Sodium 135 - 145 mmol/L 137 140 139  Potassium 3.5 - 5.1 mmol/L 3.6 4.7 4.0  Chloride 101 - 111 mmol/L 105 99 101  CO2 22 - 32 mmol/L 21(L) 26 23  Calcium 8.9 - 10.3 mg/dL 8.6(L) 9.4 8.9  Total Protein 6.0 - 8.5 g/dL - - 6.8  Total Bilirubin 0.0 - 1.2 mg/dL - - 0.8  Alkaline Phos 39 - 117 IU/L - - 162(H)  AST 0 - 40 IU/L - - 18  ALT 0 - 32 IU/L - - 24    Lipid Panel     Component Value Date/Time   CHOL 123 03/12/2017 1204   TRIG 86 03/12/2017  1204   HDL 36 (L) 03/12/2017 1204   CHOLHDL 3.4 03/12/2017 1204   CHOLHDL 5.2 (H) 02/06/2016 1557   VLDL 42 (H) 02/06/2016 1557   LDLCALC 70 03/12/2017 1204    Lab Results  Component Value Date   HGBA1C 9.0 12/11/2017    Assessment & Plan:   1. Type 2 diabetes mellitus without complication, without long-term current use of insulin (HCC) Uncontrolled with A1c of 9.0 Increased dose of Lantus Diabetic diet, lifestyle modifications - POCT glucose (manual entry) - POCT glycosylated hemoglobin (Hb A1C) - glipiZIDE (GLUCOTROL) 10 MG tablet; Take 1 tablet (10 mg total) by mouth 2 (two) times daily before a meal.  Dispense: 60 tablet; Refill: 5 - Insulin Pen Needle (ULTICARE PEN NEEDLES) 29G X 12.7MM MISC; Use as directed  Dispense: 100 each; Refill: 2 - Insulin Glargine (LANTUS SOLOSTAR) 100 UNIT/ML Solostar Pen; Inject 20 Units into the skin daily at 10 pm.  Dispense: 5 pen; Refill: 3 - metFORMIN (GLUCOPHAGE) 1000 MG tablet; Take 1 tablet (1,000 mg total) by mouth 2 (two) times daily with a meal.  Dispense: 60 tablet; Refill: 5  2. Pure hypercholesterolemia Low Cholesterol Continue statin - atorvastatin (LIPITOR)  40 MG tablet; Take 1 tablet (40 mg total) by mouth daily.  Dispense: 30 tablet; Refill: 5  3. Pain in both upper extremities Unknown etiology We will add short-term tramadol to her regimen - traMADol (ULTRAM) 50 MG tablet; Take 1 tablet (50 mg total) by mouth at bedtime as needed.  Dispense: 30 tablet; Refill: 0 - meloxicam (MOBIC) 7.5 MG tablet; Take 1 tablet (7.5 mg total) by mouth daily.  Dispense: 30 tablet; Refill: 2  4. Right-sided low back pain with right-sided sciatica, unspecified chronicity Stable - tiZANidine (ZANAFLEX) 4 MG tablet; Take 1 tablet (4 mg total) by mouth every 8 (eight) hours as needed for muscle spasms.  Dispense: 60 tablet; Refill: 2  5. Other constipation Advised to increase fiber intake - polyethylene glycol powder (GLYCOLAX/MIRALAX) powder; Take 17 g by mouth daily.  Dispense: 3350 g; Refill: 1   Meds ordered this encounter  Medications  . traMADol (ULTRAM) 50 MG tablet    Sig: Take 1 tablet (50 mg total) by mouth at bedtime as needed.    Dispense:  30 tablet    Refill:  0  . atorvastatin (LIPITOR) 40 MG tablet    Sig: Take 1 tablet (40 mg total) by mouth daily.    Dispense:  30 tablet    Refill:  5  . glipiZIDE (GLUCOTROL) 10 MG tablet    Sig: Take 1 tablet (10 mg total) by mouth 2 (two) times daily before a meal.    Dispense:  60 tablet    Refill:  5  . Insulin Pen Needle (ULTICARE PEN NEEDLES) 29G X 12.7MM MISC    Sig: Use as directed    Dispense:  100 each    Refill:  2  . Insulin Glargine (LANTUS SOLOSTAR) 100 UNIT/ML Solostar Pen    Sig: Inject 20 Units into the skin daily at 10 pm.    Dispense:  5 pen    Refill:  3    Discontinue previous dose  . meloxicam (MOBIC) 7.5 MG tablet    Sig: Take 1 tablet (7.5 mg total) by mouth daily.    Dispense:  30 tablet    Refill:  2  . metFORMIN (GLUCOPHAGE) 1000 MG tablet    Sig: Take 1 tablet (1,000 mg total) by mouth 2 (two) times daily  with a meal.    Dispense:  60 tablet    Refill:  5  .  tiZANidine (ZANAFLEX) 4 MG tablet    Sig: Take 1 tablet (4 mg total) by mouth every 8 (eight) hours as needed for muscle spasms.    Dispense:  60 tablet    Refill:  2  . polyethylene glycol powder (GLYCOLAX/MIRALAX) powder    Sig: Take 17 g by mouth daily.    Dispense:  3350 g    Refill:  1    Follow-up: Return in about 3 months (around 03/12/2018) for follow up of chronic medical conditions.   Charlott Rakes MD

## 2017-12-11 NOTE — Patient Instructions (Addendum)
Estreimiento en los adultos Constipation, Adult Se llama estreimiento cuando:  Tiene deposiciones (defeca) una menor cantidad de veces a la semana de lo normal.  Tiene dificultad para defecar.  Las heces son secas y duras o son ms grandes que lo normal.  Siga estas indicaciones en su casa: Comida y bebida   Consuma alimentos con alto contenido de fibra, por ejemplo: ? Frutas y verduras frescas. ? Cereales integrales. ? Frijoles.  Consuma una menor cantidad de alimentos ricos en grasas, con bajo contenido de fibra o excesivamente procesados, como: ? Papas fritas. ? Hamburguesas. ? Galletas. ? Caramelos. ? Gaseosas.  Beba suficiente lquido para mantener el pis (orina) claro o de color amarillo plido. Instrucciones generales  Haga actividad fsica con regularidad o segn las indicaciones del mdico.  Vaya al bao cuando sienta la necesidad de defecar. No se aguante las ganas.  Tome los medicamentos de venta libre y los recetados solamente como se lo haya indicado el mdico. Estos incluyen los suplementos de fibra.  Realice ejercicios de reentrenamiento del suelo plvico, como: ? Respirar profundamente mientras relaja la parte inferior del vientre (abdomen). ? Relajar el suelo plvico mientras defeca.  Controle su afeccin para ver si hay cambios.  Concurra a todas las visitas de control como se lo haya indicado el mdico. Esto es importante. Comunquese con un mdico si:  Siente un dolor que empeora.  Tiene fiebre.  No ha defecado por 4das.  Vomita.  No tiene hambre.  Pierde peso.  Tiene una hemorragia en el ano.  Las deposiciones (heces) son delgadas como un lpiz. Solicite ayuda de inmediato si:  Tiene fiebre, y los sntomas empeoran de repente.  Tiene prdida de materia fecal u observa sangre en las heces.  Siente el vientre ms duro o ms grande de lo normal (est hinchado).  Siente un dolor muy intenso en el vientre.  Se siente mareado o  se desmaya. Esta informacin no tiene como fin reemplazar el consejo del mdico. Asegrese de hacerle al mdico cualquier pregunta que tenga. Document Released: 09/08/2010 Document Revised: 11/07/2016 Document Reviewed: 01/25/2016 Elsevier Interactive Patient Education  2018 [employer]  

## 2017-12-12 ENCOUNTER — Encounter: Payer: Self-pay | Admitting: Family Medicine

## 2018-03-13 ENCOUNTER — Ambulatory Visit: Payer: Self-pay | Admitting: Family Medicine

## 2018-04-22 ENCOUNTER — Ambulatory Visit: Payer: Self-pay | Admitting: Family Medicine

## 2018-05-13 ENCOUNTER — Other Ambulatory Visit: Payer: Self-pay

## 2018-05-13 ENCOUNTER — Ambulatory Visit: Payer: Self-pay | Attending: Family Medicine | Admitting: Family Medicine

## 2018-05-13 ENCOUNTER — Encounter: Payer: Self-pay | Admitting: Family Medicine

## 2018-05-13 VITALS — BP 115/71 | HR 65 | Temp 98.0°F | Ht 62.0 in | Wt 163.8 lb

## 2018-05-13 DIAGNOSIS — M545 Low back pain: Secondary | ICD-10-CM | POA: Insufficient documentation

## 2018-05-13 DIAGNOSIS — Z23 Encounter for immunization: Secondary | ICD-10-CM

## 2018-05-13 DIAGNOSIS — Z1239 Encounter for other screening for malignant neoplasm of breast: Secondary | ICD-10-CM

## 2018-05-13 DIAGNOSIS — Z794 Long term (current) use of insulin: Principal | ICD-10-CM

## 2018-05-13 DIAGNOSIS — E119 Type 2 diabetes mellitus without complications: Secondary | ICD-10-CM

## 2018-05-13 DIAGNOSIS — Z79899 Other long term (current) drug therapy: Secondary | ICD-10-CM | POA: Insufficient documentation

## 2018-05-13 DIAGNOSIS — G8929 Other chronic pain: Secondary | ICD-10-CM | POA: Insufficient documentation

## 2018-05-13 DIAGNOSIS — E7800 Pure hypercholesterolemia, unspecified: Secondary | ICD-10-CM

## 2018-05-13 DIAGNOSIS — E1165 Type 2 diabetes mellitus with hyperglycemia: Secondary | ICD-10-CM

## 2018-05-13 DIAGNOSIS — Z1211 Encounter for screening for malignant neoplasm of colon: Secondary | ICD-10-CM

## 2018-05-13 DIAGNOSIS — Z1231 Encounter for screening mammogram for malignant neoplasm of breast: Secondary | ICD-10-CM

## 2018-05-13 LAB — POCT GLYCOSYLATED HEMOGLOBIN (HGB A1C): HEMOGLOBIN A1C: 7.3 % — AB (ref 4.0–5.6)

## 2018-05-13 LAB — GLUCOSE, POCT (MANUAL RESULT ENTRY): POC Glucose: 94 mg/dl (ref 70–99)

## 2018-05-13 MED ORDER — METFORMIN HCL 1000 MG PO TABS: 1000.0000 mg | ORAL_TABLET | Freq: Two times a day (BID) | ORAL | 5 refills | Status: DC

## 2018-05-13 MED ORDER — GLIPIZIDE 10 MG PO TABS: 10.0000 mg | ORAL_TABLET | Freq: Two times a day (BID) | ORAL | 5 refills | Status: DC

## 2018-05-13 MED ORDER — ATORVASTATIN CALCIUM 40 MG PO TABS: 40.0000 mg | ORAL_TABLET | Freq: Every day | ORAL | 5 refills | Status: DC

## 2018-05-13 MED ORDER — INSULIN GLARGINE 100 UNIT/ML SOLOSTAR PEN: 20.0000 [IU] | PEN_INJECTOR | Freq: Every day | SUBCUTANEOUS | 5 refills | Status: DC

## 2018-05-13 MED ORDER — GLUCOSE BLOOD VI STRP: ORAL_STRIP | 12 refills | Status: DC

## 2018-05-13 MED ORDER — TRUE METRIX METER W/DEVICE KIT: PACK | 0 refills | Status: DC

## 2018-05-13 MED ORDER — TRUEPLUS LANCETS 28G MISC: 12 refills | Status: DC

## 2018-05-13 NOTE — Patient Instructions (Signed)
Diabetes Mellitus and Nutrition When you have diabetes (diabetes mellitus), it is very important to have healthy eating habits because your blood sugar (glucose) levels are greatly affected by what you eat and drink. Eating healthy foods in the appropriate amounts, at about the same times every day, can help you:  Control your blood glucose.  Lower your risk of heart disease.  Improve your blood pressure.  Reach or maintain a healthy weight.  Every person with diabetes is different, and each person has different needs for a meal plan. Your health care provider may recommend that you work with a diet and nutrition specialist (dietitian) to make a meal plan that is best for you. Your meal plan may vary depending on factors such as:  The calories you need.  The medicines you take.  Your weight.  Your blood glucose, blood pressure, and cholesterol levels.  Your activity level.  Other health conditions you have, such as heart or kidney disease.  How do carbohydrates affect me? Carbohydrates affect your blood glucose level more than any other type of food. Eating carbohydrates naturally increases the amount of glucose in your blood. Carbohydrate counting is a method for keeping track of how many carbohydrates you eat. Counting carbohydrates is important to keep your blood glucose at a healthy level, especially if you use insulin or take certain oral diabetes medicines. It is important to know how many carbohydrates you can safely have in each meal. This is different for every person. Your dietitian can help you calculate how many carbohydrates you should have at each meal and for snack. Foods that contain carbohydrates include:  Bread, cereal, rice, pasta, and crackers.  Potatoes and corn.  Peas, beans, and lentils.  Milk and yogurt.  Fruit and juice.  Desserts, such as cakes, cookies, ice cream, and candy.  How does alcohol affect me? Alcohol can cause a sudden decrease in blood  glucose (hypoglycemia), especially if you use insulin or take certain oral diabetes medicines. Hypoglycemia can be a life-threatening condition. Symptoms of hypoglycemia (sleepiness, dizziness, and confusion) are similar to symptoms of having too much alcohol. If your health care provider says that alcohol is safe for you, follow these guidelines:  Limit alcohol intake to no more than 1 drink per day for nonpregnant women and 2 drinks per day for men. One drink equals 12 oz of beer, 5 oz of wine, or 1 oz of hard liquor.  Do not drink on an empty stomach.  Keep yourself hydrated with water, diet soda, or unsweetened iced tea.  Keep in mind that regular soda, juice, and other mixers may contain a lot of sugar and must be counted as carbohydrates.  What are tips for following this plan? Reading food labels  Start by checking the serving size on the label. The amount of calories, carbohydrates, fats, and other nutrients listed on the label are based on one serving of the food. Many foods contain more than one serving per package.  Check the total grams (g) of carbohydrates in one serving. You can calculate the number of servings of carbohydrates in one serving by dividing the total carbohydrates by 15. For example, if a food has 30 g of total carbohydrates, it would be equal to 2 servings of carbohydrates.  Check the number of grams (g) of saturated and trans fats in one serving. Choose foods that have low or no amount of these fats.  Check the number of milligrams (mg) of sodium in one serving. Most people   should limit total sodium intake to less than 2,300 mg per day.  Always check the nutrition information of foods labeled as "low-fat" or "nonfat". These foods may be higher in added sugar or refined carbohydrates and should be avoided.  Talk to your dietitian to identify your daily goals for nutrients listed on the label. Shopping  Avoid buying canned, premade, or processed foods. These  foods tend to be high in fat, sodium, and added sugar.  Shop around the outside edge of the grocery store. This includes fresh fruits and vegetables, bulk grains, fresh meats, and fresh dairy. Cooking  Use low-heat cooking methods, such as baking, instead of high-heat cooking methods like deep frying.  Cook using healthy oils, such as olive, canola, or sunflower oil.  Avoid cooking with butter, cream, or high-fat meats. Meal planning  Eat meals and snacks regularly, preferably at the same times every day. Avoid going long periods of time without eating.  Eat foods high in fiber, such as fresh fruits, vegetables, beans, and whole grains. Talk to your dietitian about how many servings of carbohydrates you can eat at each meal.  Eat 4-6 ounces of lean protein each day, such as lean meat, chicken, fish, eggs, or tofu. 1 ounce is equal to 1 ounce of meat, chicken, or fish, 1 egg, or 1/4 cup of tofu.  Eat some foods each day that contain healthy fats, such as avocado, nuts, seeds, and fish. Lifestyle   Check your blood glucose regularly.  Exercise at least 30 minutes 5 or more days each week, or as told by your health care provider.  Take medicines as told by your health care provider.  Do not use any products that contain nicotine or tobacco, such as cigarettes and e-cigarettes. If you need help quitting, ask your health care provider.  Work with a counselor or diabetes educator to identify strategies to manage stress and any emotional and social challenges. What are some questions to ask my health care provider?  Do I need to meet with a diabetes educator?  Do I need to meet with a dietitian?  What number can I call if I have questions?  When are the best times to check my blood glucose? Where to find more information:  American Diabetes Association: diabetes.org/food-and-fitness/food  Academy of Nutrition and Dietetics:  www.eatright.org/resources/health/diseases-and-conditions/diabetes  National Institute of Diabetes and Digestive and Kidney Diseases (NIH): www.niddk.nih.gov/health-information/diabetes/overview/diet-eating-physical-activity Summary  A healthy meal plan will help you control your blood glucose and maintain a healthy lifestyle.  Working with a diet and nutrition specialist (dietitian) can help you make a meal plan that is best for you.  Keep in mind that carbohydrates and alcohol have immediate effects on your blood glucose levels. It is important to count carbohydrates and to use alcohol carefully. This information is not intended to replace advice given to you by your health care provider. Make sure you discuss any questions you have with your health care provider. Document Released: 05/03/2005 Document Revised: 09/10/2016 Document Reviewed: 09/10/2016 Elsevier Interactive Patient Education  2018 Elsevier Inc.  

## 2018-05-13 NOTE — Progress Notes (Signed)
Subjective:  Patient ID: Margaret Austin, female    DOB: [birth year]  Age: 62 y.o. MRN: [id number]  CC: Diabetes   HPI Margaret Austin  is a 62 year old female with a history of type 2 diabetes mellitus (A1c 7.3), hyperlipidemia chronic sinusitis, chronic low back pain who presents today for a follow-up visit. A1c 7.3 has improved from 9.0 previously and she states her fasting sugars have been in the 120s and denies hypoglycemic episodes, numbness in extremities, visual concerns.  She has been compliant with a diabetic diet and exercise.  Has been unable to see ophthalmology due to lack of medical coverage. Also doing well on her statin with no complaints of myalgias. She has had some diarrhea for the last 3 days and nausea but this has improved today as she has not had any episode of diarrhea today.  Denies ingesting fast food recently and has no history of sick contacts. She has no acute concerns today. With regards to healthcare maintenance she is due for colonoscopy and mammogram.  Past Medical History:  Diagnosis Date  . Diabetes mellitus without complication ([place])     History reviewed. No pertinent surgical history.  No Known Allergies   Outpatient Medications Prior to Visit  Medication Sig Dispense Refill  . docusate sodium (RA COL-RITE) 100 MG capsule Take 100 mg by mouth daily. Reported on 01/09/2016    . Insulin Pen Needle (ULTICARE PEN NEEDLES) 29G X 12.7MM MISC Use as directed 100 each 2  . meloxicam (MOBIC) 7.5 MG tablet Take 1 tablet (7.5 mg total) by mouth daily. 30 tablet 2  . polyethylene glycol powder (GLYCOLAX/MIRALAX) powder Take 17 g by mouth daily. 3350 g 1  . tiZANidine (ZANAFLEX) 4 MG tablet Take 1 tablet (4 mg total) by mouth every 8 (eight) hours as needed for muscle spasms. 60 tablet 2  . atorvastatin (LIPITOR) 40 MG tablet Take 1 tablet (40 mg total) by mouth daily. 30 tablet 5  . glipiZIDE (GLUCOTROL) 10 MG tablet Take 1 tablet (10 mg total) by mouth  2 (two) times daily before a meal. 60 tablet 5  . Insulin Glargine (LANTUS SOLOSTAR) 100 UNIT/ML Solostar Pen Inject 20 Units into the skin daily at 10 pm. 5 pen 3  . metFORMIN (GLUCOPHAGE) 1000 MG tablet Take 1 tablet (1,000 mg total) by mouth 2 (two) times daily with a meal. 60 tablet 5  . Blood Glucose Monitoring Suppl (TRUE METRIX METER) w/Device KIT Used as directed, 3 times daily. (Patient not taking: Reported on 12/11/2017) 1 kit 0  . fluticasone (FLONASE) 50 MCG/ACT nasal spray Place 2 sprays into both nostrils daily. (Patient not taking: Reported on 12/11/2017) 16 g 5  . glucose blood (TRUE METRIX BLOOD GLUCOSE TEST) test strip Use as instructed, 3 times daily (Patient not taking: Reported on 12/11/2017) 100 each 12  . hydrocortisone 2.5 % cream Apply topically 2 (two) times daily. (Patient not taking: Reported on 06/12/2017) 30 g 0  . ibuprofen (ADVIL,MOTRIN) 600 MG tablet Take 1 tablet (600 mg total) by mouth every 6 (six) hours as needed. (Patient not taking: Reported on 12/11/2017) 30 tablet 0  . olopatadine (PATANOL) 0.1 % ophthalmic solution Place 1 drop into both eyes 2 (two) times daily. (Patient not taking: Reported on 06/12/2017) 5 mL 1  . traMADol (ULTRAM) 50 MG tablet Take 1 tablet (50 mg total) by mouth at bedtime as needed. (Patient not taking: Reported on 05/13/2018) 30 tablet 0  . TRUEPLUS LANCETS 28G MISC Check  blood sugar TID (Patient not taking: Reported on 12/11/2017) 100 each 12   No facility-administered medications prior to visit.     ROS Review of Systems  Constitutional: Negative for activity change, appetite change and fatigue.  HENT: Negative for congestion, sinus pressure and sore throat.   Eyes: Negative for visual disturbance.  Respiratory: Negative for cough, chest tightness, shortness of breath and wheezing.   Cardiovascular: Negative for chest pain and palpitations.  Gastrointestinal: Negative for abdominal distention, abdominal pain and constipation.    Endocrine: Negative for polydipsia.  Genitourinary: Negative for dysuria and frequency.  Musculoskeletal: Negative for arthralgias and back pain.  Skin: Negative for rash.  Neurological: Negative for tremors, light-headedness and numbness.  Hematological: Does not bruise/bleed easily.  Psychiatric/Behavioral: Negative for agitation and behavioral problems.    Objective:  BP 115/71   Pulse 65   Temp 98 F (36.7 C) (Oral)   Ht _0  (1.575 m)   Wt 163 lb 12.8 oz (74.3 kg)   SpO2 98%   BMI 29.96 kg/m   BP/Weight 05/13/2018 11/30/6436 10/24/7937  Systolic BP 688 648 472  Diastolic BP 71 71 59  Wt. (Lbs) 163.8 174.4 -  BMI 29.96 31.9 -     Physical Exam  Constitutional: She is oriented to person, place, and time. She appears well-developed and well-nourished.  HENT:  Right Ear: External ear normal.  Left Ear: External ear normal.  Mouth/Throat: Oropharynx is clear and moist.  Cardiovascular: Normal rate, normal heart sounds and intact distal pulses.  No murmur heard. Pulmonary/Chest: Effort normal and breath sounds normal. She has no wheezes. She has no rales. She exhibits no tenderness.  Abdominal: Soft. Bowel sounds are normal. She exhibits no distension and no mass. There is no tenderness.  Musculoskeletal: Normal range of motion.  Neurological: She is alert and oriented to person, place, and time.  Skin: Skin is warm and dry.  Psychiatric: She has a normal mood and affect.    CMP Latest Ref Rng & Units 09/26/2017 09/12/2017 03/12/2017  Glucose 65 - 99 mg/dL 181(H) 230(H) 193(H)  BUN 6 - 20 mg/dL _1 Creatinine 0.44 - 1.00 mg/dL 0.52 0.61 0.52(L)  Sodium 135 - 145 mmol/L 137 140 139  Potassium 3.5 - 5.1 mmol/L 3.6 4.7 4.0  Chloride 101 - 111 mmol/L 105 99 101  CO2 22 - 32 mmol/L 21(L) 26 23  Calcium 8.9 - 10.3 mg/dL 8.6(L) 9.4 8.9  Total Protein 6.0 - 8.5 g/dL - - 6.8  Total Bilirubin 0.0 - 1.2 mg/dL - - 0.8  Alkaline Phos 39 - 117 IU/L - - 162(H)  AST 0 - 40 IU/L  - - 18  ALT 0 - 32 IU/L - - 24    Lipid Panel     Component Value Date/Time   CHOL 123 03/12/2017 1204   TRIG 86 03/12/2017 1204   HDL 36 (L) 03/12/2017 1204   CHOLHDL 3.4 03/12/2017 1204   CHOLHDL 5.2 (H) 02/06/2016 1557   VLDL 42 (H) 02/06/2016 1557   LDLCALC 70 03/12/2017 1204    Lab Results  Component Value Date   HGBA1C 7.3 (A) 05/13/2018    Assessment & Plan:   1. Type 2 diabetes mellitus without complication, without long-term current use of insulin (HCC) Significantly with A1c of 7.3 which has decreased from 9.0 Commended on improvement Continue lifestyle modifications Unable to see an ophthalmologist due to lack of medical coverage - POCT glucose (manual entry) - POCT glycosylated hemoglobin (Hb  A1C) - Microalbumin/Creatinine Ratio, Urine - CMP14+EGFR - Lipid panel - metFORMIN (GLUCOPHAGE) 1000 MG tablet; Take 1 tablet (1,000 mg total) by mouth 2 (two) times daily with a meal.  Dispense: 60 tablet; Refill: 5 - Insulin Glargine (LANTUS SOLOSTAR) 100 UNIT/ML Solostar Pen; Inject 20 Units into the skin daily at 10 pm.  Dispense: 5 pen; Refill: 5 - glipiZIDE (GLUCOTROL) 10 MG tablet; Take 1 tablet (10 mg total) by mouth 2 (two) times daily before a meal.  Dispense: 60 tablet; Refill: 5  2. Screening for colon cancer - Ambulatory referral to Gastroenterology  3. Screening for breast cancer - MM Digital Screening; Future  4. Pure hypercholesterolemia Controlled - atorvastatin (LIPITOR) 40 MG tablet; Take 1 tablet (40 mg total) by mouth daily.  Dispense: 30 tablet; Refill: 5   Meds ordered this encounter  Medications  . metFORMIN (GLUCOPHAGE) 1000 MG tablet    Sig: Take 1 tablet (1,000 mg total) by mouth 2 (two) times daily with a meal.    Dispense:  60 tablet    Refill:  5  . Insulin Glargine (LANTUS SOLOSTAR) 100 UNIT/ML Solostar Pen    Sig: Inject 20 Units into the skin daily at 10 pm.    Dispense:  5 pen    Refill:  5    Discontinue previous dose  .  glipiZIDE (GLUCOTROL) 10 MG tablet    Sig: Take 1 tablet (10 mg total) by mouth 2 (two) times daily before a meal.    Dispense:  60 tablet    Refill:  5  . atorvastatin (LIPITOR) 40 MG tablet    Sig: Take 1 tablet (40 mg total) by mouth daily.    Dispense:  30 tablet    Refill:  5    Follow-up: Return in about 6 months (around 11/11/2018) for follow up of chronic medical conditions.   Charlott Rakes MD

## 2018-05-13 NOTE — Progress Notes (Signed)
Patient is having abdominal pain and diarrhea after eating.

## 2018-05-14 ENCOUNTER — Other Ambulatory Visit: Payer: Self-pay | Admitting: Family Medicine

## 2018-05-14 DIAGNOSIS — E7800 Pure hypercholesterolemia, unspecified: Secondary | ICD-10-CM

## 2018-05-14 LAB — LIPID PANEL
CHOL/HDL RATIO: 4.9 ratio — AB (ref 0.0–4.4)
Cholesterol, Total: 235 mg/dL — ABNORMAL HIGH (ref 100–199)
HDL: 48 mg/dL (ref >39)
LDL Calculated: 161 mg/dL — ABNORMAL HIGH (ref 0–99)
Triglycerides: 129 mg/dL (ref 0–149)
VLDL Cholesterol Cal: 26 mg/dL (ref 5–40)

## 2018-05-14 LAB — CMP14+EGFR
A/G RATIO: 1.7 (ref 1.2–2.2)
ALT: 25 IU/L (ref 0–32)
AST: 22 IU/L (ref 0–40)
Albumin: 4.5 g/dL (ref 3.5–5.5)
Alkaline Phosphatase: 119 IU/L — ABNORMAL HIGH (ref 39–117)
BUN/Creatinine Ratio: 13 (ref 9–23)
BUN: 7 mg/dL (ref 6–24)
Bilirubin Total: 1.2 mg/dL (ref 0.0–1.2)
CALCIUM: 9.3 mg/dL (ref 8.7–10.2)
CO2: 21 mmol/L (ref 20–29)
CREATININE: 0.55 mg/dL — AB (ref 0.57–1.00)
Chloride: 101 mmol/L (ref 96–106)
GFR, EST AFRICAN AMERICAN: 119 mL/min/{1.73_m2} (ref >59)
GFR, EST NON AFRICAN AMERICAN: 103 mL/min/{1.73_m2} (ref >59)
GLOBULIN, TOTAL: 2.7 g/dL (ref 1.5–4.5)
Glucose: 100 mg/dL — ABNORMAL HIGH (ref 65–99)
POTASSIUM: 4.1 mmol/L (ref 3.5–5.2)
SODIUM: 140 mmol/L (ref 134–144)
TOTAL PROTEIN: 7.2 g/dL (ref 6.0–8.5)

## 2018-05-14 MED ORDER — ATORVASTATIN CALCIUM 80 MG PO TABS: 80.0000 mg | ORAL_TABLET | Freq: Every day | ORAL | 6 refills | Status: DC

## 2018-05-16 ENCOUNTER — Telehealth: Payer: Self-pay

## 2018-05-16 NOTE — Telephone Encounter (Signed)
-----   Message from Charlott Rakes, MD sent at 05/14/2018  1:00 PM EDT ----- Cholesterol is elevated, I have sent a prescription for an increased dose of Lipitor to her pharmacy

## 2018-05-16 NOTE — Telephone Encounter (Signed)
Patient was called and informed of lab results via interpreter. 

## 2018-05-23 ENCOUNTER — Ambulatory Visit: Payer: Self-pay | Attending: Family Medicine

## 2018-05-26 ENCOUNTER — Other Ambulatory Visit (HOSPITAL_COMMUNITY): Payer: Self-pay | Admitting: *Deleted

## 2018-05-26 DIAGNOSIS — Z1231 Encounter for screening mammogram for malignant neoplasm of breast: Secondary | ICD-10-CM

## 2018-07-31 ENCOUNTER — Other Ambulatory Visit (HOSPITAL_COMMUNITY): Payer: Self-pay | Admitting: *Deleted

## 2018-07-31 ENCOUNTER — Ambulatory Visit: Payer: Self-pay

## 2018-07-31 ENCOUNTER — Encounter (HOSPITAL_COMMUNITY): Payer: Self-pay

## 2018-07-31 ENCOUNTER — Encounter (HOSPITAL_COMMUNITY): Payer: Self-pay | Admitting: *Deleted

## 2018-07-31 ENCOUNTER — Ambulatory Visit (HOSPITAL_COMMUNITY)
Admission: RE | Admit: 2018-07-31 | Discharge: 2018-07-31 | Disposition: A | Discharge status: Home / Self Care | Payer: Self-pay | Source: Ambulatory Visit | Attending: Obstetrics and Gynecology | Admitting: Obstetrics and Gynecology

## 2018-07-31 VITALS — BP 130/72 | Ht 61.0 in | Wt 162.0 lb

## 2018-07-31 DIAGNOSIS — N644 Mastodynia: Secondary | ICD-10-CM

## 2018-07-31 DIAGNOSIS — Z1239 Encounter for other screening for malignant neoplasm of breast: Secondary | ICD-10-CM

## 2018-07-31 NOTE — Patient Instructions (Signed)
Explained breast self awareness with Margaret Austin. Patient did not need a Pap smear today due to last Pap smear and HPV typing was 03/16/2016. Let her know [facility] will cover Pap smears and HPV typing every 5 years unless has a history of abnormal Pap smears. Referred patient to the [facility] for a diagnostic mammogram. Appointment scheduled for Tuesday, August 05, 2018 at 1350. Patient aware of appointment and will be there. Margaret Austin verbalized understanding.  Margaret Austin, Arvil Chaco, RN 11:04 AM

## 2018-07-31 NOTE — Progress Notes (Signed)
Complaints of left outer breast pain x 3 days that comes and goes. Patient rates the pain at a 5 out of 10.  Pap Smear: Pap smear not completed today. Last Pap smear was 03/16/2016 at [facility] and normal with negative HPV. Per patient has no history of an abnormal Pap smear. Last Pap smear result is in Epic.  Physical exam: Breasts Breasts symmetrical. No skin abnormalities bilateral breasts. No nipple retraction bilateral breasts. No nipple discharge bilateral breasts. No lymphadenopathy. No lumps palpated bilateral breasts. Complaints of left outer breast tenderness on exam. Referred patient to the [facility] for a diagnostic mammogram. Appointment scheduled for Tuesday, August 05, 2018 at 1350.        Pelvic/Bimanual No Pap smear completed today since last Pap smear and HPV typing was 03/16/2016. Pap smear not indicated per [facility] guidelines.   Smoking History: Patient has never smoked.  Patient Navigation: Patient education provided. Access to services provided for patient through [facility] program. Spanish interpreter provided.   Colorectal Cancer Screening: Per patient has never had a colonoscopy completed. No complaints today. FIT Test given to patient to complete and return to [facility].  Breast and Cervical Cancer Risk Assessment: Patient has no family history of breast cancer, known genetic mutations, or radiation treatment to the chest before age 41. Patient has no history of cervical dysplasia, immunocompromised, or DES exposure in-utero.  Risk Assessment    Risk Scores      07/31/2018   Last edited by: Loletta Parish, RN   5-year risk: 0.6 %   Lifetime risk: 3.4 %         Used Spanish interpreter Rudene Anda from [place].

## 2018-08-05 ENCOUNTER — Ambulatory Visit
Admission: RE | Admit: 2018-08-05 | Discharge: 2018-08-05 | Disposition: A | Discharge status: Home / Self Care | Payer: No Typology Code available for payment source | Source: Ambulatory Visit | Attending: Obstetrics and Gynecology | Admitting: Obstetrics and Gynecology

## 2018-08-05 ENCOUNTER — Other Ambulatory Visit: Payer: Self-pay

## 2018-08-05 DIAGNOSIS — N644 Mastodynia: Secondary | ICD-10-CM

## 2018-08-21 LAB — FECAL OCCULT BLOOD, IMMUNOCHEMICAL: Fecal Occult Bld: NEGATIVE

## 2018-08-25 ENCOUNTER — Encounter (HOSPITAL_COMMUNITY): Payer: Self-pay

## 2018-11-11 ENCOUNTER — Ambulatory Visit: Payer: Self-pay | Admitting: Family Medicine

## 2018-11-17 ENCOUNTER — Other Ambulatory Visit: Payer: Self-pay

## 2018-11-17 ENCOUNTER — Ambulatory Visit: Payer: Self-pay | Attending: Family Medicine | Admitting: Family Medicine

## 2018-11-17 ENCOUNTER — Encounter: Payer: Self-pay | Admitting: Family Medicine

## 2018-11-17 DIAGNOSIS — E7800 Pure hypercholesterolemia, unspecified: Secondary | ICD-10-CM

## 2018-11-17 DIAGNOSIS — E119 Type 2 diabetes mellitus without complications: Secondary | ICD-10-CM

## 2018-11-17 MED ORDER — METFORMIN HCL 1000 MG PO TABS: 1000.0000 mg | ORAL_TABLET | Freq: Two times a day (BID) | ORAL | 6 refills | Status: DC

## 2018-11-17 MED ORDER — ATORVASTATIN CALCIUM 80 MG PO TABS: 80.0000 mg | ORAL_TABLET | Freq: Every day | ORAL | 6 refills | Status: DC

## 2018-11-17 MED ORDER — INSULIN GLARGINE 100 UNIT/ML SOLOSTAR PEN: 20.0000 [IU] | PEN_INJECTOR | Freq: Every day | SUBCUTANEOUS | 6 refills | Status: DC

## 2018-11-17 MED ORDER — GLIPIZIDE 10 MG PO TABS: 10.0000 mg | ORAL_TABLET | Freq: Two times a day (BID) | ORAL | 6 refills | Status: DC

## 2018-11-17 NOTE — Progress Notes (Signed)
Virtual Visit via Telephone Note  I connected with Margaret Austin on 11/17/18 at  8:50 AM EDT by telephone and verified that I am speaking with the correct person using two identifiers.   I discussed the limitations, risks, security and privacy concerns of performing an evaluation and management service by telephone and the availability of in person appointments. I also discussed with the patient that there may be a patient responsible charge related to this service. The patient expressed understanding and agreed to proceed.   History of Present Illness: She is a 63 year old female with a history of type 2 diabetes mellitus (A1c 7.3), hyperlipidemia chronic sinusitis, chronic low back pain who presents today for a follow-up visit; telephone encounter performed with the assistance of a Spanish interpreter. She has no concerns today. Reports fasting sugars have been under 160 and random sugars have been less than 200.  Denies hypoglycemia, visual concerns or numbness in extremities. Cholesterol was elevated at her last visit and Lipitor have been reduced from 40 mg to 80 mg and she denies myalgias or other adverse effects from her medications.  Observations/Objective: AAOX3  CMP Latest Ref Rng & Units 05/13/2018 09/26/2017 09/12/2017  Glucose 65 - 99 mg/dL 100(H) 181(H) 230(H)  BUN 6 - 24 mg/dL 7 9 10   Creatinine 0.57 - 1.00 mg/dL 0.55(L) 0.52 0.61  Sodium 134 - 144 mmol/L 140 137 140  Potassium 3.5 - 5.2 mmol/L 4.1 3.6 4.7  Chloride 96 - 106 mmol/L 101 105 99  CO2 20 - 29 mmol/L 21 21(L) 26  Calcium 8.7 - 10.2 mg/dL 9.3 8.6(L) 9.4  Total Protein 6.0 - 8.5 g/dL 7.2 - -  Total Bilirubin 0.0 - 1.2 mg/dL 1.2 - -  Alkaline Phos 39 - 117 IU/L 119(H) - -  AST 0 - 40 IU/L 22 - -  ALT 0 - 32 IU/L 25 - -    Lipid Panel     Component Value Date/Time   CHOL 235 (H) 05/13/2018 1639   TRIG 129 05/13/2018 1639   HDL 48 05/13/2018 1639   CHOLHDL 4.9 (H) 05/13/2018 1639   CHOLHDL 5.2 (H)  02/06/2016 1557   VLDL 42 (H) 02/06/2016 1557   LDLCALC 161 (H) 05/13/2018 1639    Lab Results  Component Value Date   HGBA1C 7.3 (A) 05/13/2018    Assessment and Plan: 1. Type 2 diabetes mellitus without complication, without long-term current use of insulin (HCC) Controlled with A1c of 7.3 Continue current management, diabetic diet and lifestyle modifications - glipiZIDE (GLUCOTROL) 10 MG tablet; Take 1 tablet (10 mg total) by mouth 2 (two) times daily before a meal.  Dispense: 60 tablet; Refill: 6 - Insulin Glargine (LANTUS SOLOSTAR) 100 UNIT/ML Solostar Pen; Inject 20 Units into the skin daily at 10 pm.  Dispense: 5 pen; Refill: 6 - metFORMIN (GLUCOPHAGE) 1000 MG tablet; Take 1 tablet (1,000 mg total) by mouth 2 (two) times daily with a meal.  Dispense: 60 tablet; Refill: 6  2. Pure hypercholesterolemia Uncontrolled on Lipitor had been reduced to 80 mg after last set of labs Lipid panel due at next visit Low-cholesterol diet - atorvastatin (LIPITOR) 80 MG tablet; Take 1 tablet (80 mg total) by mouth daily.  Dispense: 30 tablet; Refill: 6    Follow Up Instructions:    I discussed the assessment and treatment plan with the patient. The patient was provided an opportunity to ask questions and all were answered. The patient agreed with the plan and demonstrated an understanding of  the instructions.   The patient was advised to call back or seek an in-person evaluation if the symptoms worsen or if the condition fails to improve as anticipated.  I provided 9 minutes of non-face-to-face time during this encounter.   Charlott Rakes, MD

## 2019-02-04 ENCOUNTER — Other Ambulatory Visit: Payer: Self-pay | Admitting: Family Medicine

## 2019-02-04 DIAGNOSIS — M79601 Pain in right arm: Secondary | ICD-10-CM

## 2019-05-07 ENCOUNTER — Ambulatory Visit: Payer: No Typology Code available for payment source | Admitting: Family Medicine

## 2019-05-11 ENCOUNTER — Encounter: Payer: Self-pay | Admitting: Family Medicine

## 2019-05-11 ENCOUNTER — Ambulatory Visit: Payer: Self-pay | Attending: Family Medicine | Admitting: Family Medicine

## 2019-05-11 ENCOUNTER — Other Ambulatory Visit: Payer: Self-pay

## 2019-05-11 ENCOUNTER — Ambulatory Visit: Payer: No Typology Code available for payment source | Admitting: Pharmacist

## 2019-05-11 VITALS — BP 129/71 | HR 67 | Temp 98.2°F | Ht 62.0 in | Wt 157.0 lb

## 2019-05-11 DIAGNOSIS — E7800 Pure hypercholesterolemia, unspecified: Secondary | ICD-10-CM

## 2019-05-11 DIAGNOSIS — Z23 Encounter for immunization: Secondary | ICD-10-CM

## 2019-05-11 DIAGNOSIS — E1165 Type 2 diabetes mellitus with hyperglycemia: Secondary | ICD-10-CM

## 2019-05-11 DIAGNOSIS — G8929 Other chronic pain: Secondary | ICD-10-CM

## 2019-05-11 DIAGNOSIS — Z719 Counseling, unspecified: Secondary | ICD-10-CM

## 2019-05-11 DIAGNOSIS — M25561 Pain in right knee: Secondary | ICD-10-CM

## 2019-05-11 DIAGNOSIS — M5441 Lumbago with sciatica, right side: Secondary | ICD-10-CM

## 2019-05-11 LAB — POCT GLYCOSYLATED HEMOGLOBIN (HGB A1C): HbA1c, POC (controlled diabetic range): 11 % — AB (ref 0.0–7.0)

## 2019-05-11 LAB — GLUCOSE, POCT (MANUAL RESULT ENTRY): POC Glucose: 197 mg/dl — AB (ref 70–99)

## 2019-05-11 MED ORDER — TIZANIDINE HCL 4 MG PO TABS: 4.0000 mg | ORAL_TABLET | Freq: Three times a day (TID) | ORAL | 2 refills | Status: DC | PRN

## 2019-05-11 MED ORDER — ATORVASTATIN CALCIUM 80 MG PO TABS: 80.0000 mg | ORAL_TABLET | Freq: Every day | ORAL | 6 refills | Status: DC

## 2019-05-11 MED ORDER — METFORMIN HCL 1000 MG PO TABS: 1000.0000 mg | ORAL_TABLET | Freq: Two times a day (BID) | ORAL | 6 refills | Status: DC

## 2019-05-11 MED ORDER — GLIPIZIDE 10 MG PO TABS: 10.0000 mg | ORAL_TABLET | Freq: Two times a day (BID) | ORAL | 6 refills | Status: DC

## 2019-05-11 MED ORDER — LANTUS SOLOSTAR 100 UNIT/ML ~~LOC~~ SOPN: 30.0000 [IU] | PEN_INJECTOR | Freq: Every day | SUBCUTANEOUS | 6 refills | Status: DC

## 2019-05-11 MED ORDER — MELOXICAM 7.5 MG PO TABS: 7.5000 mg | ORAL_TABLET | Freq: Every day | ORAL | 2 refills | Status: DC

## 2019-05-11 NOTE — Progress Notes (Signed)
Subjective:  Patient ID: Margaret Austin, female    DOB: 09-07-1955  Age: 63 y.o. MRN: [id number]  CC: Diabetes   HPI Margaret Austin  is a 63 year old female with a history of type 2 diabetes mellitus (A1c 11.0), hyperlipidemia chronic sinusitis, chronic low back pain who presents today for a follow-up visit; Her A1c is 11.0 and was previously 7.3, one year ago.  She endorses ingesting a lot of starches and has not been overly active.  Endorses compliance with 25 units of Lantus, denies hypoglycemia or visual concerns. Compliant with her statin and denies myalgias or other adverse effects.  She complains of chronic right hip pain which radiates down her right lower extremity and right knee pain which is chronic.  Pain in her knee is worse with going up and down the stairs.  She was prescribed meloxicam and tizanidine which she is currently out of.  Past Medical History:  Diagnosis Date   Diabetes mellitus without complication ([place])     History reviewed. No pertinent surgical history.  History reviewed. No pertinent family history.  No Known Allergies  Outpatient Medications Prior to Visit  Medication Sig Dispense Refill   Blood Glucose Monitoring Suppl (TRUE METRIX METER) w/Device KIT Used as directed, 3 times daily. 1 kit 0   docusate sodium (RA COL-RITE) 100 MG capsule Take 100 mg by mouth daily. Reported on 01/09/2016     glucose blood (TRUE METRIX BLOOD GLUCOSE TEST) test strip Use as instructed, 3 times daily 100 each 12   Insulin Pen Needle (ULTICARE PEN NEEDLES) 29G X 12.7MM MISC Use as directed 100 each 2   olopatadine (PATANOL) 0.1 % ophthalmic solution Place 1 drop into both eyes 2 (two) times daily. 5 mL 1   TRUEPLUS LANCETS 28G MISC Check blood sugar TID 100 each 12   atorvastatin (LIPITOR) 80 MG tablet Take 1 tablet (80 mg total) by mouth daily. 30 tablet 6   glipiZIDE (GLUCOTROL) 10 MG tablet Take 1 tablet (10 mg total) by mouth 2 (two) times daily  before a meal. 60 tablet 6   Insulin Glargine (LANTUS SOLOSTAR) 100 UNIT/ML Solostar Pen Inject 20 Units into the skin daily at 10 pm. 5 pen 6   meloxicam (MOBIC) 7.5 MG tablet TAKE 1 TABLET BY MOUTH DAILY. 30 tablet 2   metFORMIN (GLUCOPHAGE) 1000 MG tablet Take 1 tablet (1,000 mg total) by mouth 2 (two) times daily with a meal. 60 tablet 6   tiZANidine (ZANAFLEX) 4 MG tablet Take 1 tablet (4 mg total) by mouth every 8 (eight) hours as needed for muscle spasms. 60 tablet 2   fluticasone (FLONASE) 50 MCG/ACT nasal spray Place 2 sprays into both nostrils daily. (Patient not taking: Reported on 12/11/2017) 16 g 5   hydrocortisone 2.5 % cream Apply topically 2 (two) times daily. (Patient not taking: Reported on 05/11/2019) 30 g 0   ibuprofen (ADVIL,MOTRIN) 600 MG tablet Take 1 tablet (600 mg total) by mouth every 6 (six) hours as needed. (Patient not taking: Reported on 05/11/2019) 30 tablet 0   polyethylene glycol powder (GLYCOLAX/MIRALAX) powder Take 17 g by mouth daily. (Patient not taking: Reported on 07/31/2018) 3350 g 1   traMADol (ULTRAM) 50 MG tablet Take 1 tablet (50 mg total) by mouth at bedtime as needed. (Patient not taking: Reported on 05/13/2018) 30 tablet 0   No facility-administered medications prior to visit.      ROS Review of Systems  Constitutional: Negative for activity change, appetite change  and fatigue.  HENT: Negative for congestion, sinus pressure and sore throat.   Eyes: Negative for visual disturbance.  Respiratory: Negative for cough, chest tightness, shortness of breath and wheezing.   Cardiovascular: Negative for chest pain and palpitations.  Gastrointestinal: Negative for abdominal distention, abdominal pain and constipation.  Endocrine: Negative for polydipsia.  Genitourinary: Negative for dysuria and frequency.  Musculoskeletal:       See hpi  Skin: Negative for rash.  Neurological: Negative for tremors, light-headedness and numbness.  Hematological:  Does not bruise/bleed easily.  Psychiatric/Behavioral: Negative for agitation and behavioral problems.    Objective:  BP 129/71    Pulse 67    Temp 98.2 F (36.8 C) (Oral)    Ht '5\' 2"'  (1.575 m)    Wt 157 lb (71.2 kg)    SpO2 98%    BMI 28.72 kg/m   BP/Weight 05/11/2019 07/31/2018 1/55/2080  Systolic BP 223 361 224  Diastolic BP 71 72 71  Wt. (Lbs) 157 162 163.8  BMI 28.72 30.61 29.96      Physical Exam Constitutional:      Appearance: She is well-developed.  Cardiovascular:     Rate and Rhythm: Normal rate.     Heart sounds: Normal heart sounds. No murmur.  Pulmonary:     Effort: Pulmonary effort is normal.     Breath sounds: Normal breath sounds. No wheezing or rales.  Chest:     Chest wall: No tenderness.  Abdominal:     General: Bowel sounds are normal. There is no distension.     Palpations: Abdomen is soft. There is no mass.     Tenderness: There is no abdominal tenderness.  Musculoskeletal: Normal range of motion.     Right lower leg: No edema.     Left lower leg: No edema.  Neurological:     Mental Status: She is alert and oriented to person, place, and time.  Psychiatric:        Mood and Affect: Mood normal.     CMP Latest Ref Rng & Units 05/13/2018 09/26/2017 09/12/2017  Glucose 65 - 99 mg/dL 100(H) 181(H) 230(H)  BUN 6 - 24 mg/dL '7 9 10  ' Creatinine 0.57 - 1.00 mg/dL 0.55(L) 0.52 0.61  Sodium 134 - 144 mmol/L 140 137 140  Potassium 3.5 - 5.2 mmol/L 4.1 3.6 4.7  Chloride 96 - 106 mmol/L 101 105 99  CO2 20 - 29 mmol/L 21 21(L) 26  Calcium 8.7 - 10.2 mg/dL 9.3 8.6(L) 9.4  Total Protein 6.0 - 8.5 g/dL 7.2 - -  Total Bilirubin 0.0 - 1.2 mg/dL 1.2 - -  Alkaline Phos 39 - 117 IU/L 119(H) - -  AST 0 - 40 IU/L 22 - -  ALT 0 - 32 IU/L 25 - -    Lipid Panel     Component Value Date/Time   CHOL 235 (H) 05/13/2018 1639   TRIG 129 05/13/2018 1639   HDL 48 05/13/2018 1639   CHOLHDL 4.9 (H) 05/13/2018 1639   CHOLHDL 5.2 (H) 02/06/2016 1557   VLDL 42 (H)  02/06/2016 1557   LDLCALC 161 (H) 05/13/2018 1639    CBC    Component Value Date/Time   WBC 8.7 09/26/2017 1943   RBC 4.19 09/26/2017 1943   HGB 12.5 09/26/2017 1943   HCT 37.7 09/26/2017 1943   PLT 309 09/26/2017 1943   MCV 90.0 09/26/2017 1943   MCH 29.8 09/26/2017 1943   MCHC 33.2 09/26/2017 1943   RDW 13.0 09/26/2017 1943  LYMPHSABS 4.0 09/04/2014 2036   MONOABS 0.7 09/04/2014 2036   EOSABS 0.1 09/04/2014 2036   BASOSABS 0.0 09/04/2014 2036    Lab Results  Component Value Date   HGBA1C 11.0 (A) 05/11/2019    Assessment & Plan:   1. Type 2 diabetes mellitus with hyperglycemia, without long-term current use of insulin (HCC) Uncontrolled with A1c of 11.0 Poor compliance with a diabetic diet Increase Lantus from 25 units to 30 units at bedtime Education provided on diabetic diet, limiting carbohydrates, my plate method-clinical pharmacist called in for additional education Consider Victoza at next visit if still elevated Counseled on Diabetic diet, my plate method, 638 minutes of moderate intensity exercise/week Keep blood sugar logs with fasting goals of 80-120 mg/dl, random of less than 180 and in the event of sugars less than 60 mg/dl or greater than 400 mg/dl please notify the clinic ASAP. It is recommended that you undergo annual eye exams and annual foot exams. Pneumonia vaccine is recommended. - POCT glucose (manual entry) - POCT glycosylated hemoglobin (Hb A1C) - CMP14+EGFR - Lipid panel - metFORMIN (GLUCOPHAGE) 1000 MG tablet; Take 1 tablet (1,000 mg total) by mouth 2 (two) times daily with a meal.  Dispense: 60 tablet; Refill: 6 - glipiZIDE (GLUCOTROL) 10 MG tablet; Take 1 tablet (10 mg total) by mouth 2 (two) times daily before a meal.  Dispense: 60 tablet; Refill: 6 - Insulin Glargine (LANTUS SOLOSTAR) 100 UNIT/ML Solostar Pen; Inject 30 Units into the skin daily at 10 pm.  Dispense: 5 pen; Refill: 6  2. Pure hypercholesterolemia Uncontrolled Fasting labs  today Comply with low-cholesterol diet - atorvastatin (LIPITOR) 80 MG tablet; Take 1 tablet (80 mg total) by mouth daily.  Dispense: 30 tablet; Refill: 6  3. Chronic pain of right knee Likely osteoarthritis Use of knee brace - meloxicam (MOBIC) 7.5 MG tablet; Take 1 tablet (7.5 mg total) by mouth daily.  Dispense: 30 tablet; Refill: 2  4. Right-sided low back pain with right-sided sciatica, unspecified chronicity Uncontrolled as she has run out of medications which have refilled - tiZANidine (ZANAFLEX) 4 MG tablet; Take 1 tablet (4 mg total) by mouth every 8 (eight) hours as needed for muscle spasms.  Dispense: 60 tablet; Refill: 2   Health Care Maintenance: next visit. Meds ordered this encounter  Medications   metFORMIN (GLUCOPHAGE) 1000 MG tablet    Sig: Take 1 tablet (1,000 mg total) by mouth 2 (two) times daily with a meal.    Dispense:  60 tablet    Refill:  6   glipiZIDE (GLUCOTROL) 10 MG tablet    Sig: Take 1 tablet (10 mg total) by mouth 2 (two) times daily before a meal.    Dispense:  60 tablet    Refill:  6   atorvastatin (LIPITOR) 80 MG tablet    Sig: Take 1 tablet (80 mg total) by mouth daily.    Dispense:  30 tablet    Refill:  6   Insulin Glargine (LANTUS SOLOSTAR) 100 UNIT/ML Solostar Pen    Sig: Inject 30 Units into the skin daily at 10 pm.    Dispense:  5 pen    Refill:  6    Discontinue previous dose   meloxicam (MOBIC) 7.5 MG tablet    Sig: Take 1 tablet (7.5 mg total) by mouth daily.    Dispense:  30 tablet    Refill:  2   tiZANidine (ZANAFLEX) 4 MG tablet    Sig: Take 1 tablet (4 mg total)  by mouth every 8 (eight) hours as needed for muscle spasms.    Dispense:  60 tablet    Refill:  2    Follow-up: Return in about 1 month (around 06/10/2019) for complete physical.       Charlott Rakes, MD, FAAFP. [facility] [place], [place]   05/11/2019, 2:11 PM

## 2019-05-11 NOTE — Progress Notes (Signed)
Patient was educated on the use of the MyPlate method to aid in glycemic control. Reviewed necessary information and concepts of the MyPlate method. Also reviewed goal blood glucose levels. Patient was able to verbalize understanding. All questions and concerns were addressed.  Patient presents for vaccination against influenza per orders of Dr. Margarita Rana. Consent given. Counseling provided. No contraindications exists. Vaccine administered without incident.

## 2019-05-11 NOTE — Progress Notes (Signed)
Patient is having pain in right knee and right side buttocks.

## 2019-05-12 LAB — CMP14+EGFR
ALT: 25 IU/L (ref 0–32)
AST: 24 IU/L (ref 0–40)
Albumin/Globulin Ratio: 1.3 (ref 1.2–2.2)
Albumin: 3.9 g/dL (ref 3.8–4.9)
Alkaline Phosphatase: 119 IU/L — ABNORMAL HIGH (ref 39–117)
BUN/Creatinine Ratio: 16 (ref 12–28)
BUN: 10 mg/dL (ref 8–27)
Bilirubin Total: 1 mg/dL (ref 0.0–1.2)
CO2: 24 mmol/L (ref 20–29)
Calcium: 9.1 mg/dL (ref 8.7–10.3)
Chloride: 102 mmol/L (ref 96–106)
Creatinine, Ser: 0.61 mg/dL (ref 0.57–1.00)
GFR calc Af Amer: 114 mL/min/{1.73_m2} (ref >59)
GFR calc non Af Amer: 99 mL/min/{1.73_m2} (ref >59)
Globulin, Total: 3 g/dL (ref 1.5–4.5)
Glucose: 219 mg/dL — ABNORMAL HIGH (ref 65–99)
Potassium: 4.3 mmol/L (ref 3.5–5.2)
Sodium: 139 mmol/L (ref 134–144)
Total Protein: 6.9 g/dL (ref 6.0–8.5)

## 2019-05-12 LAB — LIPID PANEL
Chol/HDL Ratio: 3.4 ratio (ref 0.0–4.4)
Cholesterol, Total: 151 mg/dL (ref 100–199)
HDL: 45 mg/dL (ref >39)
LDL Chol Calc (NIH): 89 mg/dL (ref 0–99)
Triglycerides: 87 mg/dL (ref 0–149)
VLDL Cholesterol Cal: 17 mg/dL (ref 5–40)

## 2019-05-18 ENCOUNTER — Ambulatory Visit: Payer: No Typology Code available for payment source

## 2019-05-21 ENCOUNTER — Telehealth: Payer: Self-pay

## 2019-05-21 NOTE — Telephone Encounter (Signed)
-----   Message from Charlott Rakes, MD sent at 05/12/2019  1:20 PM EDT ----- Cholesterol is normal and so her kidney and liver functions.  Blood sugar remains elevated; please encourage to adhere to the new regimen for management of her diabetes and a diabetic diet.

## 2019-06-22 ENCOUNTER — Other Ambulatory Visit: Payer: Self-pay

## 2019-06-22 ENCOUNTER — Encounter: Payer: Self-pay | Admitting: Family Medicine

## 2019-06-22 ENCOUNTER — Ambulatory Visit: Payer: Self-pay | Attending: Family Medicine | Admitting: Family Medicine

## 2019-06-22 VITALS — BP 129/73 | HR 68 | Temp 98.3°F | Ht 62.0 in | Wt 156.6 lb

## 2019-06-22 DIAGNOSIS — Z1211 Encounter for screening for malignant neoplasm of colon: Secondary | ICD-10-CM

## 2019-06-22 DIAGNOSIS — E1165 Type 2 diabetes mellitus with hyperglycemia: Secondary | ICD-10-CM

## 2019-06-22 DIAGNOSIS — Z0000 Encounter for general adult medical examination without abnormal findings: Secondary | ICD-10-CM

## 2019-06-22 DIAGNOSIS — Z1231 Encounter for screening mammogram for malignant neoplasm of breast: Secondary | ICD-10-CM

## 2019-06-22 DIAGNOSIS — Z124 Encounter for screening for malignant neoplasm of cervix: Secondary | ICD-10-CM

## 2019-06-22 LAB — GLUCOSE, POCT (MANUAL RESULT ENTRY): POC Glucose: 82 mg/dl (ref 70–99)

## 2019-06-22 NOTE — Patient Instructions (Signed)
Mantenimiento de Technical sales engineer en [place] Maintenance, Female Adoptar un estilo de vida saludable y recibir atencin preventiva son importantes para promover la salud y Musician. Consulte al mdico sobre:  El esquema adecuado para hacerse pruebas y exmenes peridicos.  Cosas que puede hacer por su cuenta para prevenir enfermedades y [employer]. Qu debo saber sobre la dieta, el peso y el ejercicio? Consuma una dieta saludable   Consuma una dieta que incluya muchas verduras, frutas, productos lcteos con bajo contenido de [place] y Advertising account planner.  No consuma muchos alimentos ricos en grasas slidas, azcares agregados o sodio. Mantenga un peso saludable El ndice de masa muscular [facility] [place]) se [place] para identificar problemas de [place]. Proporciona una estimacin de la grasa corporal basndose en el peso y la altura. Su mdico puede ayudarle a Radiation protection practitioner [place] y a Scientist, forensic o Theatre manager un peso saludable. Haga ejercicio con regularidad Haga ejercicio con regularidad. Esta es una de las prcticas ms importantes que puede hacer por su salud. La mayora de los adultos deben seguir estas pautas:  Optometrist, al menos, 185mnutos de actividad fsica por semana. El ejercicio debe aumentar la frecuencia cardaca y hNature conservation officertranspirar (ejercicio de intensidad moderada).  Hacer ejercicios de fortalecimiento por lo mHalliburton Companypor semana. Agregue esto a su plan de ejercicio de intensidad moderada.  Pasar menos tiempo sentados. Incluso la actividad fsica ligera puede ser beneficiosa. Controle sus niveles de colesterol y lpidos en la sangre Comience a realizarse anlisis de lpidos y cResearch officer, trade unionen la sangre a los 20aos y luego reptalos cada 5aos. Hgase controlar los niveles de colesterol con mayor frecuencia si:  Sus niveles de lpidos y colesterol son altos.  Es mayor de 40aos.  Presenta un alto riesgo de padecer enfermedades cardacas. Qu debo saber sobre las pruebas de  deteccin del cncer? Segn su historia clnica y sus antecedentes familiares, es posible que deba realizarse pruebas de deteccin del cncer en diferentes edades. Esto puede incluir pruebas de deteccin de lo siguiente:  Cncer de mama.  Cncer de cuello uterino.  Cncer colorrectal.  Cncer de piel.  Cncer de pulmn. Qu debo saber sobre la enfermedad cardaca, la diabetes y la hipertensin arterial? Presin arterial y enfermedad cardaca  La hipertensin arterial causa enfermedades cardacas y aSerbiael riesgo de accidente cerebrovascular. Es ms probable que esto se manifieste en las personas que tienen lecturas de presin arterial alta, tienen ascendencia africana o tienen sobrepeso.  Hgase controlar la presin arterial: ? Cada 3 a 5 aos si tiene entre 18 y 389aos. ? Todos los aos si es mayor de 4[place] Diabetes Realcese exmenes de deteccin de la diabetes con regularidad. Este anlisis revisa el nivel de azcar en la sangre en a[place]hurst Hgase las pruebas de deteccin:  Cada tresaos despus de los 428aosde edad si tiene un peso normal y un bajo riesgo de padecer diabetes.  Con ms frecuencia y a partir de uSt. Petersedad inferior si tiene sobrepeso o un alto riesgo de padecer diabetes. Qu debo saber sobre la prevencin de infecciones? Hepatitis B Si tiene un riesgo ms alto de contraer hepatitis B, debe someterse a un examen de deteccin de este virus. Hable con el mdico para averiguar si tiene riesgo de contraer la infeccin por hepatitis B. Hepatitis C Se recomienda el anlisis a:  THexion Specialty Chemicals1945 y 1965.  Todas las personas que tengan un riesgo de haber contrado hepatitis C. Enfermedades de transmisin sexual (ETS)  Hgase las  pruebas de deteccin de ITS, incluidas la gonorrea y la clamidia, si: ? Es sexualmente activa y es menor de 24aos. ? Es mayor de 24aos, y el mdico le informa que corre riesgo de tener este tipo de infecciones. ?  La actividad sexual ha cambiado desde que le hicieron la ltima prueba de deteccin y tiene un riesgo mayor de tener clamidia o gonorrea. Pregntele al mdico si usted tiene riesgo.  Pregntele al mdico si usted tiene un alto riesgo de contraer VIH. El mdico tambin puede recomendarle un medicamento recetado para ayudar a evitar la infeccin por el VIH. Si elige tomar medicamentos para prevenir el VIH, primero debe hacerse los anlisis de deteccin del VIH. Luego debe hacerse anlisis cada 3meses mientras est tomando los medicamentos. Embarazo  Si est por dejar de menstruar (fase premenopusica) y usted puede quedar embarazada, busque asesoramiento antes de quedar embarazada.  Tome de 400 a 800microgramos (mcg) de cido flico todos los das si queda embarazada.  Pida mtodos de control de la natalidad (anticonceptivos) si desea evitar un embarazo no deseado. Osteoporosis y menopausia La osteoporosis es una enfermedad en la que los huesos pierden los minerales y la fuerza por el avance de la edad. El resultado pueden ser fracturas en los huesos. Si tiene 65aos o ms, o si est en riesgo de sufrir osteoporosis y fracturas, pregunte a su mdico si debe:  Hacerse pruebas de deteccin de prdida sea.  Tomar un suplemento de calcio o de vitamina D para reducir el riesgo de fracturas.  Recibir terapia de reemplazo hormonal (TRH) para tratar los sntomas de la menopausia. Siga estas instrucciones en su casa: Estilo de vida  No consuma ningn producto que contenga nicotina o tabaco, como cigarrillos, cigarrillos electrnicos y tabaco de mascar. Si necesita ayuda para dejar de fumar, consulte al mdico.  No consuma drogas.  No comparta agujas.  Solicite ayuda a su mdico si necesita apoyo o informacin para abandonar las drogas. Consumo de alcohol  No beba alcohol si: ? Su mdico le indica no hacerlo. ? Est embarazada, puede estar embarazada o est tratando de quedar embarazada.   Si bebe alcohol: ? Limite la cantidad que consume de 0 a 1 medida por da. ? Limite la ingesta si est amamantando.  Est atento a la cantidad de alcohol que hay en las bebidas que toma. En los [place], una medida equivale a una botella de cerveza de 12oz (355ml), un vaso de vino de 5oz (148ml) o un vaso de una bebida alcohlica de alta graduacin de 1oz (44ml). Instrucciones generales  Realcese los estudios de rutina de la salud, dentales y de la vista.  Mantngase al da con las vacunas.  Infrmele a su mdico si: ? Se siente deprimida con frecuencia. ? Alguna vez ha sido vctima de maltrato o no se siente segura en su casa. Resumen  Adoptar un estilo de vida saludable y recibir atencin preventiva son importantes para promover la salud y el bienestar.  Siga las instrucciones del mdico acerca de una dieta saludable, el ejercicio y la realizacin de pruebas o exmenes para detectar enfermedades.  Siga las instrucciones del mdico con respecto al control del colesterol y la presin arterial. Esta informacin no tiene como fin reemplazar el consejo del mdico. Asegrese de hacerle al mdico cualquier pregunta que tenga. Document Released: 07/26/2011 Document Revised: 08/27/2018 Document Reviewed: 08/27/2018 Elsevier Patient Education  2020 Elsevier Inc.  

## 2019-06-22 NOTE — Progress Notes (Signed)
Subjective:  Patient ID: Margaret Austin, female    DOB: 11/01/1955  Age: 63 y.o. MRN: [id number]  CC: Annual Exam and Gynecologic Exam   HPI Tailor Lucking presents for a complete physical exam Last diagnostic mammogram and breast ultrasound was in 11/2017 which revealed no mammographic evidence of breast malignancy Last Pap smear was in 02/2016 and was normal. She is yet to undergo colonoscopy.  Past Medical History:  Diagnosis Date  . Diabetes mellitus without complication ([place])     No past surgical history on file.  No family history on file.  No Known Allergies  Outpatient Medications Prior to Visit  Medication Sig Dispense Refill  . atorvastatin (LIPITOR) 80 MG tablet Take 1 tablet (80 mg total) by mouth daily. 30 tablet 6  . Blood Glucose Monitoring Suppl (TRUE METRIX METER) w/Device KIT Used as directed, 3 times daily. 1 kit 0  . docusate sodium (RA COL-RITE) 100 MG capsule Take 100 mg by mouth daily. Reported on 01/09/2016    . glipiZIDE (GLUCOTROL) 10 MG tablet Take 1 tablet (10 mg total) by mouth 2 (two) times daily before a meal. 60 tablet 6  . glucose blood (TRUE METRIX BLOOD GLUCOSE TEST) test strip Use as instructed, 3 times daily 100 each 12  . Insulin Glargine (LANTUS SOLOSTAR) 100 UNIT/ML Solostar Pen Inject 30 Units into the skin daily at 10 pm. 5 pen 6  . Insulin Pen Needle (ULTICARE PEN NEEDLES) 29G X 12.7MM MISC Use as directed 100 each 2  . meloxicam (MOBIC) 7.5 MG tablet Take 1 tablet (7.5 mg total) by mouth daily. 30 tablet 2  . metFORMIN (GLUCOPHAGE) 1000 MG tablet Take 1 tablet (1,000 mg total) by mouth 2 (two) times daily with a meal. 60 tablet 6  . olopatadine (PATANOL) 0.1 % ophthalmic solution Place 1 drop into both eyes 2 (two) times daily. 5 mL 1  . tiZANidine (ZANAFLEX) 4 MG tablet Take 1 tablet (4 mg total) by mouth every 8 (eight) hours as needed for muscle spasms. 60 tablet 2  . TRUEPLUS LANCETS 28G MISC Check blood sugar TID 100 each 12   . fluticasone (FLONASE) 50 MCG/ACT nasal spray Place 2 sprays into both nostrils daily. (Patient not taking: Reported on 12/11/2017) 16 g 5  . hydrocortisone 2.5 % cream Apply topically 2 (two) times daily. (Patient not taking: Reported on 05/11/2019) 30 g 0  . ibuprofen (ADVIL,MOTRIN) 600 MG tablet Take 1 tablet (600 mg total) by mouth every 6 (six) hours as needed. (Patient not taking: Reported on 05/11/2019) 30 tablet 0  . polyethylene glycol powder (GLYCOLAX/MIRALAX) powder Take 17 g by mouth daily. (Patient not taking: Reported on 07/31/2018) 3350 g 1   No facility-administered medications prior to visit.      ROS Review of Systems  Constitutional: Negative for activity change, appetite change and fatigue.  HENT: Negative for congestion, sinus pressure and sore throat.   Eyes: Negative for visual disturbance.  Respiratory: Negative for cough, chest tightness, shortness of breath and wheezing.   Cardiovascular: Negative for chest pain and palpitations.  Gastrointestinal: Negative for abdominal distention, abdominal pain and constipation.  Endocrine: Negative for polydipsia.  Genitourinary: Negative for dysuria and frequency.  Musculoskeletal: Negative for arthralgias and back pain.  Skin: Negative for rash.  Neurological: Negative for tremors, light-headedness and numbness.  Hematological: Does not bruise/bleed easily.  Psychiatric/Behavioral: Negative for agitation and behavioral problems.    Objective:  BP 129/73   Pulse 68   Temp  98.3 F (36.8 C) (Oral)   Ht '5\' 2"'  (1.575 m)   Wt 156 lb 9.6 oz (71 kg)   SpO2 100%   BMI 28.64 kg/m   BP/Weight 06/22/2019 05/11/2019 09/62/8366  Systolic BP 294 765 465  Diastolic BP 73 71 72  Wt. (Lbs) 156.6 157 162  BMI 28.64 28.72 30.61      Physical Exam Constitutional:      General: She is not in acute distress.    Appearance: She is well-developed. She is not diaphoretic.  HENT:     Head: Normocephalic.     Right Ear: External  ear normal.     Left Ear: External ear normal.     Nose: Nose normal.  Eyes:     Conjunctiva/sclera: Conjunctivae normal.     Pupils: Pupils are equal, round, and reactive to light.  Neck:     Musculoskeletal: Normal range of motion.     Vascular: No JVD.  Cardiovascular:     Rate and Rhythm: Normal rate and regular rhythm.     Heart sounds: Normal heart sounds. No murmur. No gallop.   Pulmonary:     Effort: Pulmonary effort is normal. No respiratory distress.     Breath sounds: Normal breath sounds. No wheezing or rales.  Chest:     Chest wall: No tenderness.     Breasts:        Right: Normal. No mass.        Left: Normal. No mass.  Abdominal:     General: Bowel sounds are normal. There is no distension.     Palpations: Abdomen is soft. There is no mass.     Tenderness: There is no abdominal tenderness.  Genitourinary:    Comments: External genitalia, vagina, cervix, adnexa-normal Musculoskeletal: Normal range of motion.        General: No tenderness.  Skin:    General: Skin is warm and dry.  Neurological:     Mental Status: She is alert and oriented to person, place, and time.     Deep Tendon Reflexes: Reflexes are normal and symmetric.     CMP Latest Ref Rng & Units 05/11/2019 05/13/2018 09/26/2017  Glucose 65 - 99 mg/dL 219(H) 100(H) 181(H)  BUN 8 - 27 mg/dL '10 7 9  ' Creatinine 0.57 - 1.00 mg/dL 0.61 0.55(L) 0.52  Sodium 134 - 144 mmol/L 139 140 137  Potassium 3.5 - 5.2 mmol/L 4.3 4.1 3.6  Chloride 96 - 106 mmol/L 102 101 105  CO2 20 - 29 mmol/L 24 21 21(L)  Calcium 8.7 - 10.3 mg/dL 9.1 9.3 8.6(L)  Total Protein 6.0 - 8.5 g/dL 6.9 7.2 -  Total Bilirubin 0.0 - 1.2 mg/dL 1.0 1.2 -  Alkaline Phos 39 - 117 IU/L 119(H) 119(H) -  AST 0 - 40 IU/L 24 22 -  ALT 0 - 32 IU/L 25 25 -    Lipid Panel     Component Value Date/Time   CHOL 151 05/11/2019 1031   TRIG 87 05/11/2019 1031   HDL 45 05/11/2019 1031   CHOLHDL 3.4 05/11/2019 1031   CHOLHDL 5.2 (H) 02/06/2016 1557    VLDL 42 (H) 02/06/2016 1557   LDLCALC 89 05/11/2019 1031    CBC    Component Value Date/Time   WBC 8.7 09/26/2017 1943   RBC 4.19 09/26/2017 1943   HGB 12.5 09/26/2017 1943   HCT 37.7 09/26/2017 1943   PLT 309 09/26/2017 1943   MCV 90.0 09/26/2017 1943   MCH 29.8  09/26/2017 1943   MCHC 33.2 09/26/2017 1943   RDW 13.0 09/26/2017 1943   LYMPHSABS 4.0 09/04/2014 2036   MONOABS 0.7 09/04/2014 2036   EOSABS 0.1 09/04/2014 2036   BASOSABS 0.0 09/04/2014 2036    Lab Results  Component Value Date   HGBA1C 11.0 (A) 05/11/2019    Assessment & Plan:   1. Annual physical exam Counseled on 150 minutes of exercise per week, healthy eating (including decreased daily intake of saturated fats, cholesterol, added sugars, sodium), STI prevention, routine healthcare maintenance. - Cervicovaginal ancillary only  2. Type 2 diabetes mellitus with hyperglycemia, without long-term current use of insulin (HCC) Uncontrolled but regimen had been adjusted at her last visit - POCT glucose (manual entry) - Microalbumin/Creatinine Ratio, Urine  3. Screening for cervical cancer - Cytology - PAP([place])  4. Encounter for screening mammogram for malignant neoplasm of breast - MM DIGITAL SCREENING BILATERAL; Future  5. Screening for colon cancer Unable to undergo colonoscopy due to lack of medical coverage - Fecal occult blood, imunochemical([facility])   Health Care Maintenance: Advised to apply for the [facility] financial discount to facilitate referrals for screenings. No orders of the defined types were placed in this encounter.   Follow-up: Return in about 3 months (around 09/22/2019) for Diabetes.       Charlott Rakes, MD, FAAFP. [facility] and [place], [place]   06/22/2019, 10:17 AM

## 2019-06-23 ENCOUNTER — Other Ambulatory Visit: Payer: Self-pay | Admitting: Family Medicine

## 2019-06-23 LAB — MICROALBUMIN / CREATININE URINE RATIO
Creatinine, Urine: 53.8 mg/dL
Microalb/Creat Ratio: <6 mg/g creat (ref 0–29)
Microalbumin, Urine: <3 ug/mL

## 2019-06-23 LAB — CERVICOVAGINAL ANCILLARY ONLY
Bacterial Vaginitis (gardnerella): POSITIVE — AB
Candida Glabrata: NEGATIVE
Candida Vaginitis: NEGATIVE
Chlamydia: NEGATIVE
Comment: NEGATIVE
Comment: NEGATIVE
Comment: NEGATIVE
Comment: NEGATIVE
Comment: NEGATIVE
Comment: NORMAL
Neisseria Gonorrhea: NEGATIVE
Trichomonas: NEGATIVE

## 2019-06-23 MED ORDER — METRONIDAZOLE 0.75 % VA GEL: 1.0000 | Freq: Every day | VAGINAL | 0 refills | Status: DC

## 2019-06-24 ENCOUNTER — Telehealth: Payer: Self-pay

## 2019-06-24 LAB — CYTOLOGY - PAP
Comment: NEGATIVE
Diagnosis: NEGATIVE
High risk HPV: NEGATIVE

## 2019-06-24 NOTE — Telephone Encounter (Signed)
-----   Message from Charlott Rakes, MD sent at 06/23/2019  5:25 PM EST ----- Labs reveal presence of bacterial vaginosis and I have sent a prescription for MetroGel to her pharmacy.

## 2019-06-24 NOTE — Telephone Encounter (Signed)
Patient name and DOB has been verified Patient was informed of lab results. Patient had no questions.  

## 2019-06-24 NOTE — Telephone Encounter (Signed)
-----   Message from Charlott Rakes, MD sent at 06/24/2019 12:52 PM EST ----- Pap smear is negative for malignancy

## 2019-09-22 ENCOUNTER — Ambulatory Visit: Payer: Self-pay | Attending: Family Medicine | Admitting: Family Medicine

## 2019-09-22 ENCOUNTER — Encounter: Payer: Self-pay | Admitting: Family Medicine

## 2019-09-22 ENCOUNTER — Other Ambulatory Visit: Payer: Self-pay

## 2019-09-22 VITALS — BP 117/73 | HR 72 | Temp 97.3°F | Resp 16

## 2019-09-22 DIAGNOSIS — M5441 Lumbago with sciatica, right side: Secondary | ICD-10-CM

## 2019-09-22 DIAGNOSIS — E1165 Type 2 diabetes mellitus with hyperglycemia: Secondary | ICD-10-CM

## 2019-09-22 DIAGNOSIS — E7800 Pure hypercholesterolemia, unspecified: Secondary | ICD-10-CM

## 2019-09-22 DIAGNOSIS — G8929 Other chronic pain: Secondary | ICD-10-CM

## 2019-09-22 DIAGNOSIS — M25561 Pain in right knee: Secondary | ICD-10-CM

## 2019-09-22 LAB — POCT GLYCOSYLATED HEMOGLOBIN (HGB A1C): Hemoglobin A1C: 8.6 % — AB (ref 4.0–5.6)

## 2019-09-22 LAB — GLUCOSE, POCT (MANUAL RESULT ENTRY): POC Glucose: 154 mg/dl — AB (ref 70–99)

## 2019-09-22 MED ORDER — MELOXICAM 7.5 MG PO TABS: 7.5000 mg | ORAL_TABLET | Freq: Every day | ORAL | 2 refills | Status: DC

## 2019-09-22 MED ORDER — TIZANIDINE HCL 4 MG PO TABS: 4.0000 mg | ORAL_TABLET | Freq: Three times a day (TID) | ORAL | 2 refills | Status: DC | PRN

## 2019-09-22 NOTE — Progress Notes (Signed)
Subjective:  Patient ID: Margaret Austin, female    DOB: 04/05/1956  Age: 64 y.o. MRN: [id number]  CC: Diabetes   HPI Margaret Austin  is a 64 year old female with a history of type 2 diabetes mellitus (A1c 8.6), hyperlipidemia chronic sinusitis, chronic low back pain who presents today for a follow-up visit;  Her A1c is 8.6 which has improved from 11.1, 4 months ago. Denies hypoglycemia, visual abnormalities or neuropathy symptoms. Fasting sugars are 125-130; she has been administering 25 rather than 30 units of Lantus prescribed.  She is compliant with her statin and denies presence of myalgias. She has had pain on her right flank and this radiates down her leg but she has not been doing heavy lifting . Pain is described as moderate. Tylenol and Meloxicam provide some relief but she requests a refill of Tizanidine.   Past Medical History:  Diagnosis Date  . Diabetes mellitus without complication ([place])     No past surgical history on file.  No family history on file.  No Known Allergies  Outpatient Medications Prior to Visit  Medication Sig Dispense Refill  . atorvastatin (LIPITOR) 80 MG tablet Take 1 tablet (80 mg total) by mouth daily. 30 tablet 6  . docusate sodium (RA COL-RITE) 100 MG capsule Take 100 mg by mouth daily. Reported on 01/09/2016    . glipiZIDE (GLUCOTROL) 10 MG tablet Take 1 tablet (10 mg total) by mouth 2 (two) times daily before a meal. 60 tablet 6  . meloxicam (MOBIC) 7.5 MG tablet Take 1 tablet (7.5 mg total) by mouth daily. 30 tablet 2  . metFORMIN (GLUCOPHAGE) 1000 MG tablet Take 1 tablet (1,000 mg total) by mouth 2 (two) times daily with a meal. 60 tablet 6  . Blood Glucose Monitoring Suppl (TRUE METRIX METER) w/Device KIT Used as directed, 3 times daily. 1 kit 0  . fluticasone (FLONASE) 50 MCG/ACT nasal spray Place 2 sprays into both nostrils daily. (Patient not taking: Reported on 12/11/2017) 16 g 5  . glucose blood (TRUE METRIX BLOOD GLUCOSE TEST)  test strip Use as instructed, 3 times daily 100 each 12  . hydrocortisone 2.5 % cream Apply topically 2 (two) times daily. (Patient not taking: Reported on 05/11/2019) 30 g 0  . ibuprofen (ADVIL,MOTRIN) 600 MG tablet Take 1 tablet (600 mg total) by mouth every 6 (six) hours as needed. (Patient not taking: Reported on 05/11/2019) 30 tablet 0  . Insulin Glargine (LANTUS SOLOSTAR) 100 UNIT/ML Solostar Pen Inject 30 Units into the skin daily at 10 pm. (Patient not taking: Reported on 09/22/2019) 5 pen 6  . Insulin Pen Needle (ULTICARE PEN NEEDLES) 29G X 12.7MM MISC Use as directed 100 each 2  . tiZANidine (ZANAFLEX) 4 MG tablet Take 1 tablet (4 mg total) by mouth every 8 (eight) hours as needed for muscle spasms. (Patient not taking: Reported on 09/22/2019) 60 tablet 2  . TRUEPLUS LANCETS 28G MISC Check blood sugar TID 100 each 12  . metroNIDAZOLE (METROGEL VAGINAL) 0.75 % vaginal gel Place 1 Applicatorful vaginally at bedtime. 70 g 0  . olopatadine (PATANOL) 0.1 % ophthalmic solution Place 1 drop into both eyes 2 (two) times daily. 5 mL 1  . polyethylene glycol powder (GLYCOLAX/MIRALAX) powder Take 17 g by mouth daily. (Patient not taking: Reported on 07/31/2018) 3350 g 1   No facility-administered medications prior to visit.     ROS Review of Systems  Constitutional: Negative for activity change, appetite change and fatigue.  HENT: Negative  for congestion, sinus pressure and sore throat.   Eyes: Negative for visual disturbance.  Respiratory: Negative for cough, chest tightness, shortness of breath and wheezing.   Cardiovascular: Negative for chest pain and palpitations.  Gastrointestinal: Negative for abdominal distention, abdominal pain and constipation.  Endocrine: Negative for polydipsia.  Genitourinary: Negative for dysuria and frequency.  Musculoskeletal: Positive for back pain. Negative for arthralgias.  Skin: Negative for rash.  Neurological: Negative for tremors, light-headedness and  numbness.  Hematological: Does not bruise/bleed easily.  Psychiatric/Behavioral: Negative for agitation and behavioral problems.    Objective:  BP 117/73 (BP Location: Left Arm, Patient Position: Sitting, Cuff Size: Normal)   Pulse 72   Temp (!) 97.3 F (36.3 C)   Resp 16   SpO2 100%   BP/Weight 09/22/2019 06/22/2019 3/87/5643  Systolic BP 329 518 841  Diastolic BP 73 73 71  Wt. (Lbs) - 156.6 157  BMI - 28.64 28.72      Physical Exam Constitutional:      Appearance: She is well-developed.  Neck:     Vascular: No JVD.  Cardiovascular:     Rate and Rhythm: Normal rate.     Heart sounds: Normal heart sounds. No murmur.  Pulmonary:     Effort: Pulmonary effort is normal.     Breath sounds: Normal breath sounds. No wheezing or rales.  Chest:     Chest wall: No tenderness.  Abdominal:     General: Bowel sounds are normal. There is no distension.     Palpations: Abdomen is soft. There is no mass.     Tenderness: There is no abdominal tenderness.  Musculoskeletal:        General: Normal range of motion.     Right lower leg: No edema.     Left lower leg: No edema.     Comments: Slight right lateral lumbar TTP Negative straight leg raise b/l  Neurological:     Mental Status: She is alert and oriented to person, place, and time.  Psychiatric:        Mood and Affect: Mood normal.     CMP Latest Ref Rng & Units 05/11/2019 05/13/2018 09/26/2017  Glucose 65 - 99 mg/dL 219(H) 100(H) 181(H)  BUN 8 - 27 mg/dL _0 Creatinine 0.57 - 1.00 mg/dL 0.61 0.55(L) 0.52  Sodium 134 - 144 mmol/L 139 140 137  Potassium 3.5 - 5.2 mmol/L 4.3 4.1 3.6  Chloride 96 - 106 mmol/L 102 101 105  CO2 20 - 29 mmol/L 24 21 21(L)  Calcium 8.7 - 10.3 mg/dL 9.1 9.3 8.6(L)  Total Protein 6.0 - 8.5 g/dL 6.9 7.2 -  Total Bilirubin 0.0 - 1.2 mg/dL 1.0 1.2 -  Alkaline Phos 39 - 117 IU/L 119(H) 119(H) -  AST 0 - 40 IU/L 24 22 -  ALT 0 - 32 IU/L 25 25 -    Lipid Panel     Component Value Date/Time    CHOL 151 05/11/2019 1031   TRIG 87 05/11/2019 1031   HDL 45 05/11/2019 1031   CHOLHDL 3.4 05/11/2019 1031   CHOLHDL 5.2 (H) 02/06/2016 1557   VLDL 42 (H) 02/06/2016 1557   LDLCALC 89 05/11/2019 1031    CBC    Component Value Date/Time   WBC 8.7 09/26/2017 1943   RBC 4.19 09/26/2017 1943   HGB 12.5 09/26/2017 1943   HCT 37.7 09/26/2017 1943   PLT 309 09/26/2017 1943   MCV 90.0 09/26/2017 1943   MCH 29.8 09/26/2017 1943  MCHC 33.2 09/26/2017 1943   RDW 13.0 09/26/2017 1943   LYMPHSABS 4.0 09/04/2014 2036   MONOABS 0.7 09/04/2014 2036   EOSABS 0.1 09/04/2014 2036   BASOSABS 0.0 09/04/2014 2036    Lab Results  Component Value Date   HGBA1C 8.6 (A) 09/22/2019    Assessment & Plan:   1. Type 2 diabetes mellitus with hyperglycemia, without long-term current use of insulin (HCC) Uncontrolled with A1c of 8.6 which has improved from 11.0; goal is <7 Advised to increase Lantus from 25 to 28 units qhs Counseled on Diabetic diet, my plate method, 101 minutes of moderate intensity exercise/week Blood sugar logs with fasting goals of 80-120 mg/dl, random of less than 180 and in the event of sugars less than 60 mg/dl or greater than 400 mg/dl encouraged to notify the clinic. Advised on the need for annual eye exams, annual foot exams, Pneumonia vaccine. - Glucose (CBG) - HgB A1c  2. Right-sided low back pain with right-sided sciatica, unspecified chronicity Musculoskeletal pain Advised to use heating pad - tiZANidine (ZANAFLEX) 4 MG tablet; Take 1 tablet (4 mg total) by mouth every 8 (eight) hours as needed for muscle spasms.  Dispense: 60 tablet; Refill: 2  3. Pure hypercholesterolemia Controlled Continue Statin Counseled on low cholesterol diet  4. Chronic pain of right knee Stable - meloxicam (MOBIC) 7.5 MG tablet; Take 1 tablet (7.5 mg total) by mouth daily.  Dispense: 30 tablet; Refill: 2     Return in about 3 months (around 12/20/2019) for Medical  conditions.     Charlott Rakes, MD, FAAFP. [facility] and [place], [place]   09/22/2019, 4:42 PM

## 2019-09-22 NOTE — Progress Notes (Signed)
Pain on right side of waist days ago Pain 8/10  Takes Tylenol but does not help  Tizanidine RF

## 2019-09-28 ENCOUNTER — Ambulatory Visit
Admission: RE | Admit: 2019-09-28 | Discharge: 2019-09-28 | Disposition: A | Discharge status: Home / Self Care | Payer: No Typology Code available for payment source | Source: Ambulatory Visit | Attending: Family Medicine | Admitting: Family Medicine

## 2019-09-28 ENCOUNTER — Other Ambulatory Visit: Payer: Self-pay

## 2019-09-28 DIAGNOSIS — Z1231 Encounter for screening mammogram for malignant neoplasm of breast: Secondary | ICD-10-CM

## 2019-10-08 ENCOUNTER — Telehealth: Payer: Self-pay

## 2019-10-08 NOTE — Telephone Encounter (Signed)
-----   Message from Charlott Rakes, MD sent at 10/08/2019  2:21 PM EST ----- Mammogram is negative for malignancy

## 2019-10-08 NOTE — Telephone Encounter (Signed)
Patient name and DOB has been verified Patient was informed of lab results. Patient had no questions.  

## 2019-12-22 ENCOUNTER — Ambulatory Visit: Payer: Self-pay | Attending: Family Medicine | Admitting: Family Medicine

## 2019-12-22 ENCOUNTER — Other Ambulatory Visit: Payer: Self-pay

## 2019-12-22 ENCOUNTER — Encounter: Payer: Self-pay | Admitting: Family Medicine

## 2019-12-22 VITALS — BP 117/63 | HR 72 | Ht 62.0 in | Wt 154.2 lb

## 2019-12-22 DIAGNOSIS — G8929 Other chronic pain: Secondary | ICD-10-CM

## 2019-12-22 DIAGNOSIS — E7800 Pure hypercholesterolemia, unspecified: Secondary | ICD-10-CM

## 2019-12-22 DIAGNOSIS — E1165 Type 2 diabetes mellitus with hyperglycemia: Secondary | ICD-10-CM

## 2019-12-22 DIAGNOSIS — M25511 Pain in right shoulder: Secondary | ICD-10-CM

## 2019-12-22 DIAGNOSIS — M25512 Pain in left shoulder: Secondary | ICD-10-CM

## 2019-12-22 LAB — GLUCOSE, POCT (MANUAL RESULT ENTRY): POC Glucose: 227 mg/dl — AB (ref 70–99)

## 2019-12-22 LAB — POCT GLYCOSYLATED HEMOGLOBIN (HGB A1C): HbA1c, POC (controlled diabetic range): 12.1 % — AB (ref 0.0–7.0)

## 2019-12-22 MED ORDER — METFORMIN HCL 1000 MG PO TABS: 1000.0000 mg | ORAL_TABLET | Freq: Two times a day (BID) | ORAL | 6 refills | Status: DC

## 2019-12-22 MED ORDER — LANTUS SOLOSTAR 100 UNIT/ML ~~LOC~~ SOPN: 30.0000 [IU] | PEN_INJECTOR | Freq: Every day | SUBCUTANEOUS | 6 refills | Status: DC

## 2019-12-22 MED ORDER — TRAMADOL HCL 50 MG PO TABS: 50.0000 mg | ORAL_TABLET | Freq: Every evening | ORAL | 1 refills | Status: AC | PRN

## 2019-12-22 MED ORDER — GLIPIZIDE 10 MG PO TABS: 10.0000 mg | ORAL_TABLET | Freq: Two times a day (BID) | ORAL | 6 refills | Status: DC

## 2019-12-22 MED ORDER — ATORVASTATIN CALCIUM 80 MG PO TABS: 80.0000 mg | ORAL_TABLET | Freq: Every day | ORAL | 6 refills | Status: DC

## 2019-12-22 NOTE — Progress Notes (Signed)
Patient is requesting refill on tramadol for arm pain.

## 2019-12-22 NOTE — Progress Notes (Signed)
Established Patient Office Visit  Subjective:  Patient ID: Margaret Austin, female    DOB: [birth year]  Age: 64 y.o. MRN: [id number]  CC:  Chief Complaint  Patient presents with  . Diabetes    HPI Margaret Austin is 64 yo female with a history of Diabetes Mellitus type 2 (A1c 12.1), Hyperlipidemia and chronic back pain that comes in for a follow up visit.  On today's visit, her A1c has increased from 8.6 (09/22/19) to 12.1. Margaret. Ferrel Austin states that she has not been taking her glipizide since her last visit, but endorses compliance with Metformin and 30 units of Insulin glargine at night. She has not been checking her fasting sugars, instead she has been checking them before her insulin dose. She does not report any hypoglycemic episodes, any changes in vision or numbness/tingling/weakness in her extremities.   On today's visit she also has a complaint of bilateral arm pain. She states the pain started about 3 weeks ago. She reports the pain starts in her shoulders and radiates to her arms. She describes the pain as "soreness". She works as a Secretary/administrator and believes her work might be the cause of her pain. She rates the pain 8/10 in both arms. She denies any numbness/tingling/weakness in her arms or hands. She states the pain is worse with rest and is relieved with movement/use. Use of tylenol and meloxicam provide minimal relief.   She does not have any additional complaints for today's visit.    Past Medical History:  Diagnosis Date  . Diabetes mellitus without complication ([place])     History reviewed. No pertinent surgical history.  History reviewed. No pertinent family history.  Social History   Socioeconomic History  . Marital status: Single    Spouse name: Not on file  . Number of children: Not on file  . Years of education: Not on file  . Highest education level: Not on file  Occupational History  . Not on file  Tobacco Use  . Smoking status: Never Smoker  .  Smokeless tobacco: Never Used  Substance and Sexual Activity  . Alcohol use: No  . Drug use: No  . Sexual activity: Yes    Birth control/protection: None  Other Topics Concern  . Not on file  Social History Narrative   ** Merged History Encounter **       Social Determinants of Health   Financial Resource Strain:   . Difficulty of Paying Living Expenses:   Food Insecurity:   . Worried About Charity fundraiser in the Last Year:   . Arboriculturist in the Last Year:   Transportation Needs:   . Film/video editor (Medical):   Marland Kitchen Lack of Transportation (Non-Medical):   Physical Activity:   . Days of Exercise per Week:   . Minutes of Exercise per Session:   Stress:   . Feeling of Stress :   Social Connections:   . Frequency of Communication with Friends and Family:   . Frequency of Social Gatherings with Friends and Family:   . Attends Religious Services:   . Active Member of Clubs or Organizations:   . Attends Archivist Meetings:   Marland Kitchen Marital Status:   Intimate Partner Violence:   . Fear of Current or Ex-Partner:   . Emotionally Abused:   Marland Kitchen Physically Abused:   . Sexually Abused:     Outpatient Medications Prior to Visit  Medication Sig Dispense Refill  . Blood Glucose Monitoring  Suppl (TRUE METRIX METER) w/Device KIT Used as directed, 3 times daily. 1 kit 0  . docusate sodium (RA COL-RITE) 100 MG capsule Take 100 mg by mouth daily. Reported on 01/09/2016    . glucose blood (TRUE METRIX BLOOD GLUCOSE TEST) test strip Use as instructed, 3 times daily 100 each 12  . Insulin Pen Needle (ULTICARE PEN NEEDLES) 29G X 12.7MM MISC Use as directed 100 each 2  . meloxicam (MOBIC) 7.5 MG tablet Take 1 tablet (7.5 mg total) by mouth daily. 30 tablet 2  . tiZANidine (ZANAFLEX) 4 MG tablet Take 1 tablet (4 mg total) by mouth every 8 (eight) hours as needed for muscle spasms. 60 tablet 2  . TRUEPLUS LANCETS 28G MISC Check blood sugar TID 100 each 12  . atorvastatin  (LIPITOR) 80 MG tablet Take 1 tablet (80 mg total) by mouth daily. 30 tablet 6  . glipiZIDE (GLUCOTROL) 10 MG tablet Take 1 tablet (10 mg total) by mouth 2 (two) times daily before a meal. 60 tablet 6  . Insulin Glargine (LANTUS SOLOSTAR) 100 UNIT/ML Solostar Pen Inject 30 Units into the skin daily at 10 pm. 5 pen 6  . metFORMIN (GLUCOPHAGE) 1000 MG tablet Take 1 tablet (1,000 mg total) by mouth 2 (two) times daily with a meal. 60 tablet 6  . fluticasone (FLONASE) 50 MCG/ACT nasal spray Place 2 sprays into both nostrils daily. (Patient not taking: Reported on 12/11/2017) 16 g 5  . hydrocortisone 2.5 % cream Apply topically 2 (two) times daily. (Patient not taking: Reported on 05/11/2019) 30 g 0  . ibuprofen (ADVIL,MOTRIN) 600 MG tablet Take 1 tablet (600 mg total) by mouth every 6 (six) hours as needed. (Patient not taking: Reported on 05/11/2019) 30 tablet 0   No facility-administered medications prior to visit.    No Known Allergies  ROS Review of Systems  Constitutional: Negative for activity change, appetite change, fatigue and unexpected weight change.  HENT: Negative.   Eyes: Negative for visual disturbance.  Respiratory: Negative for cough, shortness of breath and wheezing.   Cardiovascular: Negative for chest pain, palpitations and leg swelling.  Gastrointestinal: Negative for abdominal pain, constipation, diarrhea, nausea and vomiting.  Musculoskeletal: Negative for arthralgias and joint swelling.       Positive for Bilateral Shoulder Pain  Skin: Negative.   Neurological: Negative for weakness, numbness and headaches.  Psychiatric/Behavioral: Negative for dysphoric mood.      Objective:    Physical Exam  Constitutional: She is oriented to person, place, and time. She appears well-developed and well-nourished.  HENT:  Head: Normocephalic and atraumatic.  Mouth/Throat: Oropharynx is clear and moist.  Eyes: Pupils are equal, round, and reactive to light. Conjunctivae are  normal.  Neck: No JVD present.  Cardiovascular: Normal rate, regular rhythm, normal heart sounds and intact distal pulses.  Pulmonary/Chest: Effort normal and breath sounds normal. No respiratory distress.  Abdominal: Soft. Bowel sounds are normal. She exhibits no distension. There is no abdominal tenderness.  Musculoskeletal:        General: Normal range of motion.     Right shoulder: Tenderness present. Normal range of motion. Normal strength.     Left shoulder: Tenderness present. No swelling. Normal range of motion. Normal strength.       Arms:     Cervical back: Normal range of motion.  Neurological: She is alert and oriented to person, place, and time. She has normal strength.  Skin: Skin is warm and dry.  Psychiatric: She has a normal  mood and affect.  Nursing note and vitals reviewed.   BP 117/63   Pulse 72   Ht _0  (1.575 m)   Wt 154 lb 3.2 oz (69.9 kg)   BMI 28.20 kg/m  Wt Readings from Last 3 Encounters:  12/22/19 154 lb 3.2 oz (69.9 kg)  06/22/19 156 lb 9.6 oz (71 kg)  05/11/19 157 lb (71.2 kg)     Health Maintenance Due  Topic Date Due  . OPHTHALMOLOGY EXAM  Never done  . COVID-19 Vaccine (1) Never done  . COLONOSCOPY  Never done    There are no preventive care reminders to display for this patient.  Lab Results  Component Value Date   TSH 2.593 09/15/2013   Lab Results  Component Value Date   WBC 8.7 09/26/2017   HGB 12.5 09/26/2017   HCT 37.7 09/26/2017   MCV 90.0 09/26/2017   PLT 309 09/26/2017   Lab Results  Component Value Date   NA 139 05/11/2019   K 4.3 05/11/2019   CO2 24 05/11/2019   GLUCOSE 219 (H) 05/11/2019   BUN 10 05/11/2019   CREATININE 0.61 05/11/2019   BILITOT 1.0 05/11/2019   ALKPHOS 119 (H) 05/11/2019   AST 24 05/11/2019   ALT 25 05/11/2019   PROT 6.9 05/11/2019   ALBUMIN 3.9 05/11/2019   CALCIUM 9.1 05/11/2019   ANIONGAP 11 09/26/2017   Lab Results  Component Value Date   CHOL 151 05/11/2019   Lab Results    Component Value Date   HDL 45 05/11/2019   Lab Results  Component Value Date   LDLCALC 89 05/11/2019   Lab Results  Component Value Date   TRIG 87 05/11/2019   Lab Results  Component Value Date   CHOLHDL 3.4 05/11/2019   Lab Results  Component Value Date   HGBA1C 12.1 (A) 12/22/2019      Assessment & Plan:   Problem List Items Addressed This Visit      Endocrine   Diabetes ([place]) - Primary  Uncontrolled. A1c is increased at 12.1, goal is less than 7 She has not been taking glipizide since last visit. Re-start glipizide, and continue with Metformin and 30 units of insulin at night. Advised to check blood sugar first thing in the morning before meals, and log. We will check fasting sugar logs and recheck A1c at next visit and reassess.  Advised about the importance of Annual Eye Exam We will check CMP and Microabumin/Creatinine labs today    Relevant Medications   atorvastatin (LIPITOR) 80 MG tablet   glipiZIDE (GLUCOTROL) 10 MG tablet   insulin glargine (LANTUS SOLOSTAR) 100 UNIT/ML Solostar Pen   metFORMIN (GLUCOPHAGE) 1000 MG tablet   Other Relevant Orders   POCT glucose (manual entry) (Completed)   POCT glycosylated hemoglobin (Hb A1C) (Completed)   CMP14+EGFR   Microalbumin / creatinine urine ratio     Other   Hyperlipidemia  Controlled (last labs on 05/11/2019 within normal limits) Patient is compliant with Atorvastatin Continue current Statin therapy.   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 The ddx also includes rotator cuff tendinopathy, but less likely with negative Neers test and negative Hawkins. Cervical radiculopathy is less likely. Prescribed tramadol to take at bedtime as needed. Continue taking meloxicam, tylenol and apply warm compress as  needed.   Call clinic if symptoms worsen.We will reassess at next visit and see if physical therapy is needed   Relevant Medications   traMADol (ULTRAM) 50 MG tablet      Meds ordered this encounter  Medications  . atorvastatin (LIPITOR) 80 MG tablet    Sig: Take 1 tablet (80 mg total) by mouth daily.    Dispense:  30 tablet    Refill:  6  . glipiZIDE (GLUCOTROL) 10 MG tablet    Sig: Take 1 tablet (10 mg total) by mouth 2 (two) times daily before a meal.    Dispense:  60 tablet    Refill:  6  . insulin glargine (LANTUS SOLOSTAR) 100 UNIT/ML Solostar Pen    Sig: Inject 30 Units into the skin daily at 10 pm.    Dispense:  5 pen    Refill:  6    Discontinue previous dose  . metFORMIN (GLUCOPHAGE) 1000 MG tablet    Sig: Take 1 tablet (1,000 mg total) by mouth 2 (two) times daily with a meal.    Dispense:  60 tablet    Refill:  6  . traMADol (ULTRAM) 50 MG tablet    Sig: Take 1 tablet (50 mg total) by mouth at bedtime as needed.    Dispense:  30 tablet    Refill:  1    Follow-up: Return in about 3 months (around 03/23/2020) for chronic disease management.    Theodis Sato, Medical Student   Evaluation and management procedures were performed by me with Medical Student in attendance, note written by Medical Student under my supervision and collaboration. I have reviewed the note and I agree with the management and plan.  Margaret Austin has had a significant jump in her A1c from 8.6-12.1 today likely due to combination of non adherence with glipizide and nonadherent with a diabetic diet.  Encouraged to restart glipizide, comply with a diabetic diet and keep a check of her blood sugars.  If A1c remains above goal consider increasing dose of Lantus. Her chronic shoulder pain has not responded to use of muscle relaxant and NSAIDs so I will add tramadol at night and if pain persists consider PT.  Charlott Rakes, MD, FAAFP. [facility] [place], [place]    12/22/2019, 4:47 PM

## 2019-12-22 NOTE — Patient Instructions (Signed)
Diabetes mellitus y nutricin, en adultos Diabetes Mellitus and Nutrition, Adult Si sufre de diabetes (diabetes mellitus), es muy importante tener hbitos alimenticios saludables debido a que sus niveles de Designer, television/film set sangre (glucosa) se ven afectados en gran medida por lo que come y bebe. [place] alimentos saludables en las cantidades [place], aproximadamente a la [employer], [place] ayudar a:  Aeronautical engineer glucemia.  Disminuir el riesgo de sufrir una enfermedad cardaca.  Mejorar la presin arterial.  Science writer o mantener un peso saludable. Todas las personas que sufren de diabetes son diferentes y cada una tiene necesidades diferentes en cuanto a un plan de alimentacin. El mdico puede recomendarle que trabaje con un especialista en dietas y nutricin (nutricionista) para Financial trader plan para usted. Su plan de alimentacin puede variar segn factores como:  Las caloras que necesita.  Los medicamentos que toma.  Su peso.  Sus niveles de glucemia, presin arterial y colesterol.  Su nivel de [place].  Otras afecciones que tenga, como enfermedades cardacas o renales. Cmo me afectan los carbohidratos? Los carbohidratos, o hidratos de carbono, afectan su nivel de glucemia ms que cualquier otro tipo de alimento. La ingesta de carbohidratos naturalmente aumenta la cantidad de [employer]. El recuento de carbohidratos es un mtodo destinado a Catering manager un registro de la cantidad de carbohidratos que se consumen. El recuento de carbohidratos es importante para Theatre manager la glucemia a un nivel saludable, especialmente si utiliza insulina o toma determinados medicamentos por va oral para la diabetes. Es importante conocer la cantidad de carbohidratos que se pueden ingerir en cada comida sin correr Engineer, manufacturing. Esto es Psychologist, forensic. Su nutricionista puede ayudarlo a calcular la cantidad de carbohidratos que debe ingerir en cada comida y en cada  refrigerio. Entre los alimentos que contienen carbohidratos, se incluyen:  Pan, cereal, arroz, pastas y galletas.  Papas y maz.  Guisantes, frijoles y lentejas.  Leche y Estate agent.  Lambert Mody y [place].  Postres, como pasteles, galletas, helado y caramelos. Cmo me afecta el alcohol? El alcohol puede provocar disminuciones sbitas de la glucemia (hipoglucemia), especialmente si utiliza insulina o toma determinados medicamentos por va oral para la diabetes. La hipoglucemia es una afeccin potencialmente mortal. Los sntomas de la hipoglucemia (somnolencia, mareos y confusin) son similares a los sntomas de haber consumido demasiado alcohol. Si el mdico afirma que el alcohol es seguro para usted, [place] estas pautas:  Limite el consumo de alcohol a no ms de 26mdida por da si es mujer y no est [place] y a 219midas si es hombre. Una medida equivale a 12oz (35564mde cerveza, 5oz (148m60me vino o 1oz (44ml75m bebidas alcohlicas de alta graduacin.  No beba con el estmago vaco.  Mantngase hidratado bebiendo agua, refrescos dietticos o t helado sin azcar.  Tenga en cuenta que los refrescos comunes, los jugos y otras bebida para mezclOptician, dispensingen contener mucha azcar y se deben contar como carbohidratos. Cules son algunos consejos para seguir este plan?  Leer las etiquetas de los alimentos  Comience por leer el tamao de la porcin en la "Informacin nutricional" en las etiquetas de los alimentos envasados y las bebidas. La cantidad de caloras, carbohidratos, grasas y otros nutrientes mencionados en la etiqueta se basan en una porcin del alimento. Muchos alimentos contienen ms de una porcin por envase.  Verifique la cantidad total de gramos (g) de carbohidratos totales en una porcin. Puede calcular la cantidad de porciones de carbohidratos al dividir el  total de carbohidratos por 15. Por ejemplo, si un alimento tiene un total de 30g de carbohidratos, equivale a 2  porciones de carbohidratos.  Verifique la cantidad de gramos (g) de grasas saturadas y grasas trans en una porcin. Escoja alimentos que no contengan grasa o que tengan un bajo contenido.  Verifique la cantidad de miligramos (mg) de sal (sodio) en una porcin. [place] [employer] de las personas deben limitar la ingesta de sodio total a menos de 2325m por dTraining and development officer  Siempre consulte la informacin nutricional de los alimentos etiquetados como "con bajo contenido de grasa" o "sin grasa". Estos alimentos pueden tener un mayor contenido de aLocation manageragregada o carbohidratos refinados, y deben evitarse.  Hable con su nutricionista para identificar sus objetivos diarios en cuanto a los nutrientes mencionados en la etiqueta. Al ir de compras  Evite comprar alimentos procesados, enlatados o precocinados. Estos alimentos tienden a tSpecial educational needs teachermayor cantidad de gGerlach sodio y azcar agregada.  Compre en la zona exterior de la tienda de comestibles. Esta zona incluye frutas y verduras frescas, granos a granel, carnes frescas y productos lcteos frescos. Al cocinar  Utilice mtodos de coccin a baja temperatura, como hornear, en lugar de mtodos de coccin a alta temperatura, como frer en abundante aceite.  Cocine con aceites saludables, como el aceite de oPeebles canola o gMilton  Evite cocinar con manteca, crema o carnes con alto contenido de grasa. Planificacin de las comidas  Coma las comidas y los refrigerios regularmente, preferentemente a la misma hora todos lMorrilton Evite pasar largos perodos de tiempo sin comer.  Consuma alimentos ricos en fibra, como frutas frescas, verduras, frijoles y cereales integrales. Consulte a su nutricionista sobre cuntas porciones de carbohidratos puede consumir en cada comida.  Consuma entre 4 y 6 onzas (oz) de protenas magras por da, como carnes mEllsworth pollo, pescado, huevos o tofu. Una onza de protena magra equivale a: ? 1 onza de carne, pollo o  pescado. ? 1huevo. ?  taza de tofu.  Coma algunos alimentos por da que contengan grasas saludables, como aguacates, frutos secos, semillas y pescado. Estilo de vida  Controle su nivel de glucemia con regularidad.  Haga actividad fsica habitualmente como se lo haya indicado el mdico. Esto puede incluir lo siguiente: ? 1551mutos semanales de ejercicio de intensidad moderada o alta. Esto podra incluir caminatas dinmicas, ciclismo o gimnasia acutica. ? Realizar ejercicios de elongacin y de fortalecimiento, como yoga o levantamiento de pesas, por lo menos 2veces por semana.  Tome los meTenneco Ince lo haya indicado el mdico.  No consuma ningn producto que contenga nicotina o tabaco, como cigarrillos y ciPsychologist, sport and exerciseSi necesita ayuda para dejar de fumar, consulte al mdHess Corporationon un asesor o instructor en diabetes para identificar estrategias para controlar el estrs y cualquier desafo emocional y social. Preguntas para hacerle al mdico  Es necesario que consulte a unRadio broadcast assistantn el cuidado de la diabetes?  Es necesario que me rena con un nutricionista?  A qu nmero puedo llamar si tengo preguntas?  Cules son los mejores momentos para controlar la glucemia? Dnde encontrar ms informacin:  Asociacin Estadounidense de la Diabetes (American Diabetes Association): diabetes.org  Academia de Nutricin y DiInformation systems managerAcademy of Nutrition and Dietetics): www.eatright.orAieaiabetes y las Enfermedades Digestivas y Renales (NSt John'S Episcopal [facility] Shoref Diabetes and Digestive and Kidney Diseases, NIH): wwDesMoinesFuneral.dkesumen  Un plan de alimentacin saludable lo ayudar a coAeronautical engineerlucemia y maTheatre managern estilo de vida saludable.  Trabajar con un especialista en dietas y nutricin (nutricionista) puede ayudarlo a elaborar el mejor plan de alimentacin para usted.  Tenga en cuenta que los carbohidratos (hidratos de  carbono) y el alcohol tienen efectos inmediatos en sus niveles de glucemia. Es importante contar los carbohidratos que ingiere y consumir alcohol con prudencia. Esta informacin no tiene como fin reemplazar el consejo del mdico. Asegrese de hacerle al mdico cualquier pregunta que tenga. Document Revised: 04/16/2017 Document Reviewed: 11/26/2016 Elsevier Patient Education  2020 Elsevier Inc.  

## 2019-12-23 LAB — CMP14+EGFR
ALT: 28 IU/L (ref 0–32)
AST: 26 IU/L (ref 0–40)
Albumin/Globulin Ratio: 1.5 (ref 1.2–2.2)
Albumin: 4.4 g/dL (ref 3.8–4.8)
Alkaline Phosphatase: 141 IU/L — ABNORMAL HIGH (ref 39–117)
BUN/Creatinine Ratio: 14 (ref 12–28)
BUN: 8 mg/dL (ref 8–27)
Bilirubin Total: 1 mg/dL (ref 0.0–1.2)
CO2: 26 mmol/L (ref 20–29)
Calcium: 9.6 mg/dL (ref 8.7–10.3)
Chloride: 97 mmol/L (ref 96–106)
Creatinine, Ser: 0.59 mg/dL (ref 0.57–1.00)
GFR calc Af Amer: 114 mL/min/{1.73_m2} (ref >59)
GFR calc non Af Amer: 99 mL/min/{1.73_m2} (ref >59)
Globulin, Total: 3 g/dL (ref 1.5–4.5)
Glucose: 195 mg/dL — ABNORMAL HIGH (ref 65–99)
Potassium: 3.9 mmol/L (ref 3.5–5.2)
Sodium: 137 mmol/L (ref 134–144)
Total Protein: 7.4 g/dL (ref 6.0–8.5)

## 2019-12-23 LAB — MICROALBUMIN / CREATININE URINE RATIO
Creatinine, Urine: 9.8 mg/dL
Microalbumin, Urine: <3 ug/mL

## 2019-12-24 ENCOUNTER — Telehealth: Payer: Self-pay

## 2019-12-24 NOTE — Telephone Encounter (Signed)
-----   Message from Charlott Rakes, MD sent at 12/23/2019  5:28 PM EDT ----- Please inform her her labs are stable

## 2019-12-24 NOTE — Telephone Encounter (Signed)
Patient was called and a voicemail was left informing patient to return phone call for lab results. 

## 2019-12-31 ENCOUNTER — Other Ambulatory Visit: Payer: Self-pay | Admitting: Family Medicine

## 2019-12-31 DIAGNOSIS — E119 Type 2 diabetes mellitus without complications: Secondary | ICD-10-CM

## 2020-01-01 ENCOUNTER — Telehealth: Payer: Self-pay

## 2020-01-01 NOTE — Telephone Encounter (Signed)
Patient was called and a voicemail was left informing patient to return phone call for lab results.  A letter has been mailed out

## 2020-01-01 NOTE — Telephone Encounter (Signed)
-----   Message from Charlott Rakes, MD sent at 12/23/2019  5:28 PM EDT ----- Please inform her her labs are stable

## 2020-03-24 ENCOUNTER — Ambulatory Visit: Payer: No Typology Code available for payment source | Admitting: Family Medicine

## 2020-05-10 ENCOUNTER — Other Ambulatory Visit: Payer: Self-pay

## 2020-05-10 ENCOUNTER — Other Ambulatory Visit: Payer: Self-pay | Admitting: Family Medicine

## 2020-05-10 ENCOUNTER — Encounter: Payer: Self-pay | Admitting: Family Medicine

## 2020-05-10 ENCOUNTER — Ambulatory Visit: Payer: Self-pay | Attending: Family Medicine | Admitting: Family Medicine

## 2020-05-10 VITALS — BP 118/75 | HR 71 | Ht 62.0 in | Wt 159.0 lb

## 2020-05-10 DIAGNOSIS — E1165 Type 2 diabetes mellitus with hyperglycemia: Secondary | ICD-10-CM

## 2020-05-10 DIAGNOSIS — E7800 Pure hypercholesterolemia, unspecified: Secondary | ICD-10-CM

## 2020-05-10 DIAGNOSIS — Z23 Encounter for immunization: Secondary | ICD-10-CM

## 2020-05-10 DIAGNOSIS — M5441 Lumbago with sciatica, right side: Secondary | ICD-10-CM

## 2020-05-10 LAB — POCT GLYCOSYLATED HEMOGLOBIN (HGB A1C): HbA1c, POC (controlled diabetic range): 8.6 % — AB (ref 0.0–7.0)

## 2020-05-10 LAB — GLUCOSE, POCT (MANUAL RESULT ENTRY): POC Glucose: 144 mg/dl — AB (ref 70–99)

## 2020-05-10 MED ORDER — LANTUS SOLOSTAR 100 UNIT/ML ~~LOC~~ SOPN: 30.0000 [IU] | PEN_INJECTOR | Freq: Every day | SUBCUTANEOUS | 6 refills | Status: DC

## 2020-05-10 MED ORDER — ATORVASTATIN CALCIUM 80 MG PO TABS: 80.0000 mg | ORAL_TABLET | Freq: Every day | ORAL | 6 refills | Status: DC

## 2020-05-10 MED ORDER — MELOXICAM 7.5 MG PO TABS: 7.5000 mg | ORAL_TABLET | Freq: Every day | ORAL | 2 refills | Status: DC

## 2020-05-10 MED ORDER — GLIPIZIDE 10 MG PO TABS: 10.0000 mg | ORAL_TABLET | Freq: Two times a day (BID) | ORAL | 6 refills | Status: DC

## 2020-05-10 MED ORDER — METFORMIN HCL 1000 MG PO TABS: 1000.0000 mg | ORAL_TABLET | Freq: Two times a day (BID) | ORAL | 6 refills | Status: DC

## 2020-05-10 MED ORDER — TIZANIDINE HCL 4 MG PO TABS: 4.0000 mg | ORAL_TABLET | Freq: Three times a day (TID) | ORAL | 2 refills | Status: DC | PRN

## 2020-05-10 NOTE — Progress Notes (Signed)
 Subjective:  Patient ID: Margaret Austin, female    DOB: 01/06/1956  Age: 63 y.o. MRN: [id number]  CC: Diabetes   HPI Margaret Austin  is 63 yo female with a history of Diabetes Mellitus type 2 (A1c 8.6), Hyperlipidemia and chronic back pain that comes in for a follow up visit. Her A1c is 8.6 which is down from 12.1 previously and she has been commended on this.  She endorses compliance with her medications but has been administering 28 units of Lantus rather than 30 units. Denies presence of hypoglycemia, numbness in extremities or visual concerns and she is doing well on her statin.  Complains of 5 day h/o right buttock pain which is described as sharp and radiates down RLE. She has had similar pain in the past. Pain is intermittent and is relieved with meloxicam.  Past Medical History:  Diagnosis Date  . Diabetes mellitus without complication (HCC)     No past surgical history on file.  No family history on file.  No Known Allergies  Outpatient Medications Prior to Visit  Medication Sig Dispense Refill  . atorvastatin (LIPITOR) 80 MG tablet Take 1 tablet (80 mg total) by mouth daily. 30 tablet 6  . Blood Glucose Monitoring Suppl (TRUE METRIX METER) w/Device KIT Used as directed, 3 times daily. 1 kit 0  . docusate sodium (RA COL-RITE) 100 MG capsule Take 100 mg by mouth daily. Reported on 01/09/2016    . glipiZIDE (GLUCOTROL) 10 MG tablet Take 1 tablet (10 mg total) by mouth 2 (two) times daily before a meal. 60 tablet 6  . glucose blood (TRUE METRIX BLOOD GLUCOSE TEST) test strip Use as instructed, 3 times daily 100 each 12  . hydrocortisone 2.5 % cream Apply topically 2 (two) times daily. 30 g 0  . ibuprofen (ADVIL,MOTRIN) 600 MG tablet Take 1 tablet (600 mg total) by mouth every 6 (six) hours as needed. 30 tablet 0  . insulin glargine (LANTUS SOLOSTAR) 100 UNIT/ML Solostar Pen Inject 30 Units into the skin daily at 10 pm. 5 pen 6  . meloxicam (MOBIC) 7.5 MG tablet Take 1  tablet (7.5 mg total) by mouth daily. 30 tablet 2  . metFORMIN (GLUCOPHAGE) 1000 MG tablet Take 1 tablet (1,000 mg total) by mouth 2 (two) times daily with a meal. 60 tablet 6  . tiZANidine (ZANAFLEX) 4 MG tablet Take 1 tablet (4 mg total) by mouth every 8 (eight) hours as needed for muscle spasms. 60 tablet 2  . TRUEPLUS 5-BEVEL PEN NEEDLES 31G X 8 MM MISC USE AS DIRECTED 100 each 2  . TRUEPLUS LANCETS 28G MISC Check blood sugar TID 100 each 12  . fluticasone (FLONASE) 50 MCG/ACT nasal spray Place 2 sprays into both nostrils daily. (Patient not taking: Reported on 12/11/2017) 16 g 5   No facility-administered medications prior to visit.     ROS Review of Systems  Constitutional: Negative for activity change, appetite change and fatigue.  HENT: Negative for congestion, sinus pressure and sore throat.   Eyes: Negative for visual disturbance.  Respiratory: Negative for cough, chest tightness, shortness of breath and wheezing.   Cardiovascular: Negative for chest pain and palpitations.  Gastrointestinal: Negative for abdominal distention, abdominal pain and constipation.  Endocrine: Negative for polydipsia.  Genitourinary: Negative for dysuria and frequency.  Musculoskeletal:       See HPI  Skin: Negative for rash.  Neurological: Negative for tremors, light-headedness and numbness.  Hematological: Does not bruise/bleed easily.  Psychiatric/Behavioral: Negative   for agitation and behavioral problems.    Objective:  BP 118/75   Pulse 71   Ht 5' 2" (1.575 m)   Wt 159 lb (72.1 kg)   SpO2 98%   BMI 29.08 kg/m   BP/Weight 05/10/2020 12/22/2019 09/22/2019  Systolic BP 118 117 117  Diastolic BP 75 63 73  Wt. (Lbs) 159 154.2 -  BMI 29.08 28.2 -      Physical Exam Constitutional:      Appearance: She is well-developed.  Neck:     Vascular: No JVD.  Cardiovascular:     Rate and Rhythm: Normal rate.     Heart sounds: Normal heart sounds. No murmur heard.   Pulmonary:     Effort:  Pulmonary effort is normal.     Breath sounds: Normal breath sounds. No wheezing or rales.  Chest:     Chest wall: No tenderness.  Abdominal:     General: Bowel sounds are normal. There is no distension.     Palpations: Abdomen is soft. There is no mass.     Tenderness: There is no abdominal tenderness.  Musculoskeletal:        General: Normal range of motion.     Right lower leg: No edema.     Left lower leg: No edema.     Comments: Positive straight leg raise  Neurological:     Mental Status: She is alert and oriented to person, place, and time.  Psychiatric:        Mood and Affect: Mood normal.     CMP Latest Ref Rng & Units 12/22/2019 05/11/2019 05/13/2018  Glucose 65 - 99 mg/dL 195(H) 219(H) 100(H)  BUN 8 - 27 mg/dL 8 10 7  Creatinine 0.57 - 1.00 mg/dL 0.59 0.61 0.55(L)  Sodium 134 - 144 mmol/L 137 139 140  Potassium 3.5 - 5.2 mmol/L 3.9 4.3 4.1  Chloride 96 - 106 mmol/L 97 102 101  CO2 20 - 29 mmol/L 26 24 21  Calcium 8.7 - 10.3 mg/dL 9.6 9.1 9.3  Total Protein 6.0 - 8.5 g/dL 7.4 6.9 7.2  Total Bilirubin 0.0 - 1.2 mg/dL 1.0 1.0 1.2  Alkaline Phos 39 - 117 IU/L 141(H) 119(H) 119(H)  AST 0 - 40 IU/L 26 24 22  ALT 0 - 32 IU/L 28 25 25    Lipid Panel     Component Value Date/Time   CHOL 151 05/11/2019 1031   TRIG 87 05/11/2019 1031   HDL 45 05/11/2019 1031   CHOLHDL 3.4 05/11/2019 1031   CHOLHDL 5.2 (H) 02/06/2016 1557   VLDL 42 (H) 02/06/2016 1557   LDLCALC 89 05/11/2019 1031    CBC    Component Value Date/Time   WBC 8.7 09/26/2017 1943   RBC 4.19 09/26/2017 1943   HGB 12.5 09/26/2017 1943   HCT 37.7 09/26/2017 1943   PLT 309 09/26/2017 1943   MCV 90.0 09/26/2017 1943   MCH 29.8 09/26/2017 1943   MCHC 33.2 09/26/2017 1943   RDW 13.0 09/26/2017 1943   LYMPHSABS 4.0 09/04/2014 2036   MONOABS 0.7 09/04/2014 2036   EOSABS 0.1 09/04/2014 2036   BASOSABS 0.0 09/04/2014 2036    Lab Results  Component Value Date   HGBA1C 8.6 (A) 05/10/2020    Assessment &  Plan:  1. Type 2 diabetes mellitus with hyperglycemia, without long-term current use of insulin (HCC) Uncontrolled with A1c of 8.6 which is down from 12.1 previously; goal is less than 7.0 Advised to administer 30 units of Lantus   as prescribed Counseled on Diabetic diet, my plate method, 025 minutes of moderate intensity exercise/week Blood sugar logs with fasting goals of 80-120 mg/dl, random of less than 180 and in the event of sugars less than 60 mg/dl or greater than 400 mg/dl encouraged to notify the clinic. Advised on the need for annual eye exams, annual foot exams, Pneumonia vaccine. - POCT glucose (manual entry) - POCT glycosylated hemoglobin (Hb A1C) - glipiZIDE (GLUCOTROL) 10 MG tablet; Take 1 tablet (10 mg total) by mouth 2 (two) times daily before a meal.  Dispense: 60 tablet; Refill: 6 - insulin glargine (LANTUS SOLOSTAR) 100 UNIT/ML Solostar Pen; Inject 30 Units into the skin daily at 10 pm.  Dispense: 30 mL; Refill: 6 - metFORMIN (GLUCOPHAGE) 1000 MG tablet; Take 1 tablet (1,000 mg total) by mouth 2 (two) times daily with a meal.  Dispense: 60 tablet; Refill: 6 - Basic Metabolic Panel  2. Pure hypercholesterolemia Controlled - atorvastatin (LIPITOR) 80 MG tablet; Take 1 tablet (80 mg total) by mouth daily.  Dispense: 30 tablet; Refill: 6  3. Right-sided low back pain with right-sided sciatica, unspecified chronicity Stable - meloxicam (MOBIC) 7.5 MG tablet; Take 1 tablet (7.5 mg total) by mouth daily.  Dispense: 30 tablet; Refill: 2 - tiZANidine (ZANAFLEX) 4 MG tablet; Take 1 tablet (4 mg total) by mouth every 8 (eight) hours as needed for muscle spasms.  Dispense: 60 tablet; Refill: 2 - Ambulatory referral to Physical Therapy  4. Need for immunization against influenza - Flu Vaccine QUAD 36+ mos IM   No orders of the defined types were placed in this encounter.   Return in about 3 months (around 08/09/2020) for Chronic disease management.       Charlott Rakes,  MD, FAAFP. [facility] and [place], [place]   05/10/2020, 1:58 PM

## 2020-05-10 NOTE — Patient Instructions (Signed)
Citica Sciatica  La citica es el dolor, debilidad, hormigueo o prdida de la sensibilidad (adormecimiento) a lo largo del nervio citico. El nervio citico comienza en la parte inferior de la espalda y desciende por la parte posterior de cada pierna. Suele desaparecer por s sola o con tratamiento. A veces, la citica puede volver a aparecer (ser recurrente). Cules son las causas? Esta afeccin se produce cuando el nervio citico se comprime o se ejerce presin sobre l. Esto puede ser el resultado de:  Un disco que sobresale demasiado entre los huesos de la columna vertebral (hernia de disco).  Los cambios que se producen [employer] discos vertebrales al Veterinary surgeon.  Una afeccin en un msculo de las nalgas.  Un crecimiento seo adicional cerca del nervio citico.  Una rotura (fractura) de la zona que est entre los huesos de la cadera (pelvis).  Embarazo.  Tumor. Esto es poco frecuente. Qu incrementa el riesgo? Es ms probable que tengan esta afeccin las personas que:  Therapist, occupational deportes que ponen presin o tensin sobre la columna vertebral.  Tienen poca fuerza y facilidad de movimiento (flexibilidad).  Han tenido una lesin en la espalda en el pasado.  Han tenido una ciruga en la espalda.  Permanecen sentadas durante largos perodos.  Realizan actividades que implican agacharse o levantar objetos una y Anthony.  Tienen mucho sobrepeso (es obeso). Cules son los signos o los sntomas? Los sntomas pueden variar de leves a muy graves. Pueden incluir los siguientes:  Cualquiera de los siguientes problemas en la parte inferior de la espalda, piernas, cadera o nalgas: ? Hormigueo leve, prdida de la sensibilidad o dolor sordo. ? Sensacin de ardor. ? Dolor agudo.  Prdida de la sensibilidad en la parte posterior de la pantorrilla o la planta del pie.  Debilidad en las piernas.  Dolor muy intenso en la espalda que dificulta el movimiento. Estos sntomas pueden  empeorar al toser, Brewing technologist o rer. Tambin pueden empeorar al sentarse o estar de pie durante largos perodos. Cmo se trata? A menudo, esta afeccin mejora sin tratamiento. Sin embargo, el tratamiento puede incluir:  Quarry manager de [place] fsica o reducirla cuando siente dolor.  Hacer ejercicios y estiramientos.  Aplicar hielo o calor sobre la zona afectada.  Medicamentos para lo siguiente: ? Aliviar el dolor y la inflamacin. ? Relajar los msculos.  Inyecciones de medicamentos que ayudan a Best boy, la irritacin y la hinchazn.  Ciruga. Siga estas instrucciones en su casa: Medicamentos  [employer] de venta libre y los recetados solamente como se lo haya indicado el mdico.  Consulte a su mdico si el medicamento que le recetaron: ? Hace que sea necesario que evite conducir o usar maquinaria pesada. ? Puede causarle dificultad para defecar (estreimiento). Es posible que deba tomar estas medidas para prevenir o tratar los problemas para defecar:  Electronics engineer suficiente lquido para Contractor pis (la orina) de color amarillo plido.  Tomar medicamentos recetados o de [employer].  Comer alimentos ricos en fibra. Entre ellos, frijoles, cereales integrales y frutas y verduras frescas.  Limitar los alimentos con alto contenido de grasa y Location manager. Estos incluyen alimentos fritos o dulces. Control del dolor      Si se lo indican, aplique hielo en la zona afectada. ? Ponga el hielo en una bolsa plstica. ? Coloque una [employer] piel y Therapist, nutritional. ? Coloque el hielo durante 72minutos, 2 a 3veces por da.  Si se lo indican, aplique calor en la zona afectada.  Use la fuente de calor que el mdico le indique, por ejemplo, una compresa de calor hmedo o una almohadilla trmica. ? Coloque una [employer] piel y la fuente de Freight forwarder. ? Aplique calor durante 20 a 50minutos. ? Retire la fuente de calor si la piel se pone de color rojo brillante. Esto es muy  importante si no puede Education officer, environmental, calor o fro. Puede correr un riesgo mayor de sufrir quemaduras. Actividad   Retome sus actividades habituales como se lo haya indicado el mdico. Pregntele al mdico qu actividades son seguras para usted.  Evite las [employer] sntomas.  Descanse por breves perodos Agricultural consultant. ? Cuando descanse durante perodos ms largos, haga alguna actividad fsica o un estiramiento entre los perodos de descanso. ? Evite estar sentado durante largos perodos sin moverse. Levntese y [place] al menos una vez cada hora.  Haga ejercicios y estrese con regularidad, como se lo indic el mdico.  No levante nada que pese ms de 10libras (4.5kg) mientras tenga sntomas de citica. ? Aunque no tenga sntomas, evite levantar objetos pesados. ? Evite levantar objetos pesados de forma repetida.  Al levantar objetos, hgalo siempre de una forma que sea segura para su cuerpo. Para esto, debe hacer lo siguiente: ? [place]. ? Mantenga el objeto cerca del cuerpo. ? No gire el cuerpo. Instrucciones generales  Mantenga un peso saludable.  Use calzado cmodo, que le d soporte al pie. Evite usar tacones.  Evite dormir sobre un colchn que sea demasiado blando o demasiado duro. Es posible que sienta menos dolor si duerme en un colchn con apoyo suficientemente firme para la espalda.  Concurra a todas las visitas de seguimiento como se lo haya indicado el mdico. Esto es importante. Comunquese con un mdico si:  Tiene un dolor con estas caractersticas: ? Lo despierta cuando est dormido. ? Empeora al estar recostado. ? Es Event organiser que tena en el pasado. ? Dura ms de 4semanas.  Pierde peso sin proponrselo. Solicite ayuda inmediatamente si:  No puede controlar la orina (miccin) ni la evacuacin de la materia fecal (defecacin).  Tiene debilidad en alguna de estas zonas, y la debilidad empeora: ? La parte inferior  de la espalda. ? La zona que se encuentra entre los [employer] caderas. ? Las nalgas. ? Las piernas.  Siente irritacin o inflamacin en la espalda.  Tiene sensacin de ardor al [employer]. Resumen  La citica es el dolor, debilidad, hormigueo o prdida de la sensibilidad (adormecimiento) a lo largo del nervio citico.  Esta afeccin se produce cuando el nervio citico se comprime o se ejerce presin sobre l.  La citica puede Engineer, drilling, hormigueo o prdida de la sensibilidad (adormecimiento) en la parte inferior de la espalda, las piernas, las caderas y las nalgas.  El tratamiento a menudo incluye reposo, ejercicio, medicamentos y Midwife hielo o calor en la zona afectada. Esta informacin no tiene Marine scientist el consejo del mdico. Asegrese de hacerle al mdico cualquier pregunta que tenga. Document Revised: 10/01/2018 Document Reviewed: 10/01/2018 Elsevier Patient Education  [place].

## 2020-05-10 NOTE — Progress Notes (Signed)
Having pain in right side of buttocks. States that it has been there for 5 days.

## 2020-05-11 ENCOUNTER — Ambulatory Visit: Payer: No Typology Code available for payment source | Admitting: Family Medicine

## 2020-05-11 ENCOUNTER — Encounter: Payer: Self-pay | Admitting: *Deleted

## 2020-05-11 LAB — BASIC METABOLIC PANEL
BUN/Creatinine Ratio: 16 (ref 12–28)
BUN: 9 mg/dL (ref 8–27)
CO2: 27 mmol/L (ref 20–29)
Calcium: 9 mg/dL (ref 8.7–10.3)
Chloride: 100 mmol/L (ref 96–106)
Creatinine, Ser: 0.55 mg/dL — ABNORMAL LOW (ref 0.57–1.00)
GFR calc Af Amer: 117 mL/min/{1.73_m2} (ref >59)
GFR calc non Af Amer: 102 mL/min/{1.73_m2} (ref >59)
Glucose: 125 mg/dL — ABNORMAL HIGH (ref 65–99)
Potassium: 4.5 mmol/L (ref 3.5–5.2)
Sodium: 138 mmol/L (ref 134–144)

## 2020-05-16 ENCOUNTER — Telehealth: Payer: Self-pay | Admitting: Family Medicine

## 2020-05-16 NOTE — Telephone Encounter (Signed)
Patient is calling to schedule appt to update the CAFA application. Please advise CB- [phone]

## 2020-05-17 NOTE — Telephone Encounter (Signed)
Pt was call and schedule a financial appt on 05/23/20

## 2020-05-23 ENCOUNTER — Ambulatory Visit: Payer: Self-pay | Attending: Family Medicine

## 2020-05-23 ENCOUNTER — Other Ambulatory Visit: Payer: Self-pay

## 2020-05-31 ENCOUNTER — Ambulatory Visit: Payer: No Typology Code available for payment source | Attending: Family Medicine | Admitting: Physical Therapy

## 2020-08-16 ENCOUNTER — Ambulatory Visit: Payer: No Typology Code available for payment source | Admitting: Family Medicine

## 2020-10-03 ENCOUNTER — Encounter: Payer: Self-pay | Admitting: Family Medicine

## 2020-10-03 ENCOUNTER — Other Ambulatory Visit: Payer: Self-pay | Admitting: Family Medicine

## 2020-10-03 ENCOUNTER — Ambulatory Visit: Payer: Self-pay | Attending: Family Medicine | Admitting: Family Medicine

## 2020-10-03 ENCOUNTER — Other Ambulatory Visit: Payer: Self-pay

## 2020-10-03 VITALS — BP 118/67 | HR 80 | Ht 62.0 in | Wt 158.2 lb

## 2020-10-03 DIAGNOSIS — E1165 Type 2 diabetes mellitus with hyperglycemia: Secondary | ICD-10-CM

## 2020-10-03 DIAGNOSIS — E785 Hyperlipidemia, unspecified: Secondary | ICD-10-CM

## 2020-10-03 DIAGNOSIS — M545 Low back pain, unspecified: Secondary | ICD-10-CM

## 2020-10-03 DIAGNOSIS — G8929 Other chronic pain: Secondary | ICD-10-CM

## 2020-10-03 DIAGNOSIS — E1169 Type 2 diabetes mellitus with other specified complication: Secondary | ICD-10-CM

## 2020-10-03 LAB — POCT GLYCOSYLATED HEMOGLOBIN (HGB A1C): HbA1c, POC (controlled diabetic range): 11.6 % — AB (ref 0.0–7.0)

## 2020-10-03 LAB — GLUCOSE, POCT (MANUAL RESULT ENTRY)
POC Glucose: 415 mg/dl — AB (ref 70–99)
POC Glucose: 314 mg/dl — AB (ref 70–99)

## 2020-10-03 MED ORDER — ATORVASTATIN CALCIUM 80 MG PO TABS: 80.0000 mg | ORAL_TABLET | Freq: Every day | ORAL | 6 refills | Status: DC

## 2020-10-03 MED ORDER — MELOXICAM 7.5 MG PO TABS
7.5000 mg | ORAL_TABLET | Freq: Every day | ORAL | 2 refills | Status: DC
Start: 2020-10-03 — End: 2020-10-03

## 2020-10-03 MED ORDER — LANTUS SOLOSTAR 100 UNIT/ML ~~LOC~~ SOPN: 35.0000 [IU] | PEN_INJECTOR | Freq: Every day | SUBCUTANEOUS | 6 refills | Status: DC

## 2020-10-03 MED ORDER — GLIPIZIDE 10 MG PO TABS: 10.0000 mg | ORAL_TABLET | Freq: Two times a day (BID) | ORAL | 6 refills | Status: DC

## 2020-10-03 MED ORDER — TRUEPLUS 5-BEVEL PEN NEEDLES 31G X 8 MM MISC: 2 refills | Status: DC

## 2020-10-03 MED ORDER — INSULIN ASPART 100 UNIT/ML ~~LOC~~ SOLN
20.0000 [IU] | Freq: Once | SUBCUTANEOUS | Status: AC
  Administered 2020-10-03: 20 [IU] via SUBCUTANEOUS

## 2020-10-03 MED ORDER — VICTOZA 18 MG/3ML ~~LOC~~ SOPN: PEN_INJECTOR | SUBCUTANEOUS | 3 refills | Status: DC

## 2020-10-03 MED ORDER — METFORMIN HCL 1000 MG PO TABS: 1000.0000 mg | ORAL_TABLET | Freq: Two times a day (BID) | ORAL | 6 refills | Status: DC

## 2020-10-03 MED ORDER — TRUEPLUS LANCETS 28G MISC: 12 refills | Status: DC

## 2020-10-03 MED ORDER — TIZANIDINE HCL 4 MG PO TABS: 4.0000 mg | ORAL_TABLET | Freq: Three times a day (TID) | ORAL | 2 refills | Status: DC | PRN

## 2020-10-03 NOTE — Patient Instructions (Signed)
Diabetes mellitus y nutricin, en adultos Diabetes Mellitus and Nutrition, Adult Si sufre de diabetes, o diabetes mellitus, es muy importante tener hbitos alimenticios saludables debido a que sus niveles de Designer, television/film set sangre (glucosa) se ven afectados en gran medida por lo que come y bebe. [place] alimentos saludables en las cantidades correctas, aproximadamente a la misma hora todos los [place], [place] ayudar a:  Aeronautical engineer glucemia.  Disminuir el riesgo de sufrir una enfermedad cardaca.  Mejorar la presin arterial.  Science writer o mantener un peso saludable. Qu puede afectar mi plan de alimentacin? Todas las personas que sufren de diabetes son diferentes y cada una tiene necesidades diferentes en cuanto a un plan de alimentacin. El mdico puede recomendarle que trabaje con un nutricionista para elaborar el mejor plan para usted. Su plan de alimentacin puede variar segn factores como:  Las caloras que necesita.  Los medicamentos que toma.  Su peso.  Sus niveles de glucemia, presin arterial y colesterol.  Su nivel de [place].  Otras afecciones que tenga, como enfermedades cardacas o renales. Cmo me afectan los carbohidratos? Los carbohidratos, o hidratos de carbono, afectan su nivel de glucemia ms que cualquier otro tipo de alimento. La ingesta de carbohidratos naturalmente aumenta la cantidad de [employer]. El recuento de carbohidratos es un mtodo destinado a Catering manager un registro de la cantidad de carbohidratos que se consumen. El recuento de carbohidratos es importante para Theatre manager la glucemia a un nivel saludable, especialmente si utiliza insulina o toma determinados medicamentos por va oral para la diabetes. Es importante conocer la cantidad de carbohidratos que se pueden ingerir en cada comida sin correr Engineer, manufacturing. Esto es Psychologist, forensic. Su nutricionista puede ayudarlo a calcular la cantidad de carbohidratos que debe ingerir en cada comida y en cada  refrigerio. Cmo me afecta el alcohol? El alcohol puede provocar disminuciones sbitas de la glucemia (hipoglucemia), especialmente si utiliza insulina o toma determinados medicamentos por va oral para la diabetes. La hipoglucemia es una afeccin potencialmente mortal. Los sntomas de la hipoglucemia, como somnolencia, mareos y confusin, son similares a los sntomas de haber consumido demasiado alcohol.  No beba alcohol si: ? Su mdico le indica no hacerlo. ? Est embarazada, puede estar embarazada o est tratando de quedar embarazada.  Si bebe alcohol: ? No beba con el estmago vaco. ? Limite la cantidad que bebe:  De 0 a 1 medida por da para las mujeres.  De 0 a 2 medidas por da para los hombres. ? Est atento a la cantidad de alcohol que hay en las bebidas que toma. En los [place], una medida equivale a una botella de cerveza de 12oz (368m), un vaso de vino de 5oz (1468m o un vaso de una bebida alcohlica de alta graduacin de 1oz (4485m ? Mantngase hidratado bebiendo agua, refrescos dietticos o t helado sin azcar.  Tenga en cuenta que los refrescos comunes, los jugos y otras bebida para mezOptician, dispensingeden contener mucha azcar y se deben contar como carbohidratos. Consejos para seguir estPhotographers etiquetas de los alimentos  Comience por leer el tamao de la porcin en la "Informacin nutricional" en las etiquetas de los alimentos envasados y las bebidas. La cantidad de caloras, carbohidratos, grasas y otros nutrientes mencionados en la etiqueta se basan en una porcin del alimento. Muchos alimentos contienen ms de una porcin por envase.  Verifique la cantidad total de gramos (g) de carbohidratos totales en una porcin. Puede calcular la cantidad de porciones  de carbohidratos al dividir el total de carbohidratos por 15. Por ejemplo, si un alimento tiene un total de 30g de carbohidratos totales por porcin, equivale a 2 porciones de  carbohidratos.  Verifique la cantidad de gramos (g) de grasas saturadas y grasas trans de una porcin. Escoja alimentos que no contengan estas grasas o que su contenido de estas sea [place].  Verifique la cantidad de miligramos (mg) de sal (sodio) en una porcin. La [employer] de las personas deben limitar la ingesta de sodio total a menos de 2333m por dTraining and development officer  Siempre consulte la informacin nutricional de los alimentos etiquetados como "con bajo contenido de grasa" o "sin grasa". Estos alimentos pueden tener un mayor contenido de aLocation manageragregada o carbohidratos refinados, y deben evitarse.  Hable con su nutricionista para identificar sus objetivos diarios en cuanto a los nutrientes mencionados en la etiqueta. Al ir de compras  Evite comprar alimentos procesados, enlatados o precocidos. Estos alimentos tienden a tSpecial educational needs teachermayor cantidad de gF[place] sodio y azcar agregada.  Compre en la zona exterior de la tienda de comestibles. Esta es la zona donde se encuentran con mayor frecuencia las frutas y las verduras frescas, los cereales a granel, las carnes frescas y los productos lcteos frescos. Al cocinar  Utilice mtodos de coccin a baja temperatura, como hornear, en lugar de mtodos de coccin a alta temperatura, como frer en abundante aceite.  Cocine con aceites saludables, como el aceite de oFrontier canola o gPiketon  Evite cocinar con manteca, crema o carnes con alto contenido de grasa. Planificacin de las comidas  Coma las comidas y los refrigerios regularmente, preferentemente a la misma hora todos lKnightstown Evite pasar largos perodos de tiempo sin comer.  Consuma alimentos ricos en fibra, como frutas frescas, verduras, frijoles y cereales integrales. Consulte a su nutricionista sobre cuntas porciones de carbohidratos puede consumir en cada comida.  Consuma entre 4 y 6 onzas (entre 112 y 168g) de protenas magras por da, como carnes magras, pollo, pescado, huevos o tofu. Una onza (oz) de  protena magra equivale a: ? 1 onza (28g) de carne, pollo o pescado. ? 1huevo. ?  de taza (62 g) de tofu.  Coma algunos alimentos por da que contengan grasas saludables, como aguacates, frutos secos, semillas y pescado.   Qu alimentos debo comer? FLambert ModyBayas. Manzanas. Naranjas. Duraznos. Damascos. Ciruelas. Uvas. Mango. Papaya. GTolna Kiwi. Cerezas. VHolland CommonsLValeda Malm Espinaca. Verduras de hBoeing que incluyen col rizada, aFolsom hojas de bIraqy de mFountain Remolachas. Coliflor. Repollo. Brcoli. Zanahorias. Judas verdes. Tomates. Pimientos. Cebollas. Pepinos. Coles de Bruselas. Granos Granos integrales, como panes, galletas, tortillas, cereales y pastas de salvado o integrales. Avena sin azcar. Quinua. Arroz integral o salvaje. Carnes y oPsychiatric nurse Carne de ave sin piel. Cortes magros de ave y carne de res. Tofu. Frutos secos. Semillas. Lcteos Productos lcteos sin grasa o con bajo contenido de gPepin cUpper Stewartsville yogur y qKahaluu-Keauhou Es posible que los productos que se enumeran ms aNew Caledoniano constituyan una lista completa de los alimentos y las bebidas que puede tomar. Consulte a un nutricionista para obtener ms informacin. Qu alimentos debo evitar? FLambert ModyFrutas enlatadas al almbar. Verduras Verduras enlatadas. Verduras congeladas con mantequilla o salsa de crema. Granos Productos elaborados con hIsraely hLao People's Democratic Republic como panes, pastas, bocadillos y cereales. Evite todos los alimentos procesados. Carnes y otras protenas Cortes de carne con alto contenido de gLobbyist Carne de ave con piel. Carnes empanizadas o fritas. Carne procesada. Evite las grasas saturadas.  los alimentos y las bebidas que debe evitar. Consulte a un nutricionista para obtener ms informacin. Preguntas para hacerle al mdico Es necesario que me rena con un instructor en el cuidado  de la diabetes? Es necesario que me rena con un nutricionista? A qu nmero puedo llamar si tengo preguntas? Cules son los mejores momentos para controlar la glucemia? Dnde encontrar ms informacin: Asociacin Estadounidense de la Diabetes (American Diabetes Association): diabetes.org Academy of Nutrition and Dietetics (Academia de Nutricin y Diettica): www.eatright.org National Institute of Diabetes and Digestive and Kidney Diseases (Instituto Nacional de la Diabetes y las Enfermedades Digestivas y Renales): www.niddk.nih.gov Association of Diabetes Care and Education Specialists (Asociacin de Especialistas en Atencin y Educacin sobre la Diabetes): www.diabeteseducator.org Resumen Es importante tener hbitos alimenticios saludables debido a que sus niveles de azcar en la sangre (glucosa) se ven afectados en gran medida por lo que come y bebe. Un plan de alimentacin saludable lo ayudar a controlar la glucemia y mantener un estilo de vida saludable. El mdico puede recomendarle que trabaje con un nutricionista para elaborar el mejor plan para usted. Tenga en cuenta que los carbohidratos (hidratos de carbono) y el alcohol tienen efectos inmediatos en sus niveles de glucemia. Es importante contar los carbohidratos que ingiere y consumir alcohol con prudencia. Esta informacin no tiene como fin reemplazar el consejo del mdico. Asegrese de hacerle al mdico cualquier pregunta que tenga. Document Revised: 09/10/2019 Document Reviewed: 09/10/2019 Elsevier Patient Education  2021 Elsevier Inc.  

## 2020-10-03 NOTE — Progress Notes (Signed)
Subjective:  Patient ID: Margaret Austin, female    DOB: [birth year]  Age: 65 y.o. MRN: [id number]  CC: Diabetes   HPI Margaret Austin is 65 yo female with a history of Diabetes Mellitus type 2 (A1c 11.6), Hyperlipidemia and chronic back pain that comes in for a follow up visit. She has been out of insulin x1 week.  A1c is 11.6 up from 8.6 previously and blood sugar in the clinic is 415 and she has had nothing to eat all day.  She endorses compliance with Lantus prior to running out 1 week ago.  Her L lower back hurts and is described as moderate but does not radiate down her legs.  Symptoms have been present for 8 days.  She previously had right lower back pain with sciatica which she does not complain of today. She has also been out of her statin and is needing refills. Currently fasting in anticipation of blood work.  Past Medical History:  Diagnosis Date  . Diabetes mellitus without complication ([place])     No past surgical history on file.  No family history on file.  No Known Allergies  Outpatient Medications Prior to Visit  Medication Sig Dispense Refill  . Blood Glucose Monitoring Suppl (TRUE METRIX METER) w/Device KIT Used as directed, 3 times daily. 1 kit 0  . docusate sodium (COLACE) 100 MG capsule Take 100 mg by mouth daily. Reported on 01/09/2016    . glucose blood (TRUE METRIX BLOOD GLUCOSE TEST) test strip Use as instructed, 3 times daily 100 each 12  . hydrocortisone 2.5 % cream Apply topically 2 (two) times daily. 30 g 0  . atorvastatin (LIPITOR) 80 MG tablet Take 1 tablet (80 mg total) by mouth daily. 30 tablet 6  . glipiZIDE (GLUCOTROL) 10 MG tablet Take 1 tablet (10 mg total) by mouth 2 (two) times daily before a meal. 60 tablet 6  . insulin glargine (LANTUS SOLOSTAR) 100 UNIT/ML Solostar Pen Inject 30 Units into the skin daily at 10 pm. 30 mL 6  . meloxicam (MOBIC) 7.5 MG tablet Take 1 tablet (7.5 mg total) by mouth daily. 30 tablet 2  . metFORMIN  (GLUCOPHAGE) 1000 MG tablet Take 1 tablet (1,000 mg total) by mouth 2 (two) times daily with a meal. 60 tablet 6  . tiZANidine (ZANAFLEX) 4 MG tablet Take 1 tablet (4 mg total) by mouth every 8 (eight) hours as needed for muscle spasms. 60 tablet 2  . TRUEPLUS 5-BEVEL PEN NEEDLES 31G X 8 MM MISC USE AS DIRECTED 100 each 2  . TRUEPLUS LANCETS 28G MISC Check blood sugar TID 100 each 12  . fluticasone (FLONASE) 50 MCG/ACT nasal spray Place 2 sprays into both nostrils daily. (Patient not taking: No sig reported) 16 g 5   No facility-administered medications prior to visit.     ROS Review of Systems  Constitutional: Negative for activity change, appetite change and fatigue.  HENT: Negative for congestion, sinus pressure and sore throat.   Eyes: Negative for visual disturbance.  Respiratory: Negative for cough, chest tightness, shortness of breath and wheezing.   Cardiovascular: Negative for chest pain and palpitations.  Gastrointestinal: Negative for abdominal distention, abdominal pain and constipation.  Endocrine: Negative for polydipsia.  Genitourinary: Negative for dysuria and frequency.  Musculoskeletal:       See HPI  Skin: Negative for rash.  Neurological: Negative for tremors, light-headedness and numbness.  Hematological: Does not bruise/bleed easily.  Psychiatric/Behavioral: Negative for agitation and behavioral  problems.    Objective:  BP 118/67   Pulse 80   Ht '5\' 2"'  (1.575 m)   Wt 158 lb 3.2 oz (71.8 kg)   SpO2 98%   BMI 28.94 kg/m   BP/Weight 10/03/2020 4/98/2641 12/25/3092  Systolic BP 076 808 811  Diastolic BP 67 75 63  Wt. (Lbs) 158.2 159 154.2  BMI 28.94 29.08 28.2      Physical Exam Constitutional:      Appearance: She is well-developed.  Neck:     Vascular: No JVD.  Cardiovascular:     Rate and Rhythm: Normal rate.     Heart sounds: Normal heart sounds. No murmur heard.   Pulmonary:     Effort: Pulmonary effort is normal.     Breath sounds: Normal  breath sounds. No wheezing or rales.  Chest:     Chest wall: No tenderness.  Abdominal:     General: Bowel sounds are normal. There is no distension.     Palpations: Abdomen is soft. There is no mass.     Tenderness: There is no abdominal tenderness.  Musculoskeletal:        General: Normal range of motion.     Right lower leg: No edema.     Left lower leg: No edema.     Comments: Slight tenderness on deep palpation of left lumbar spine  Neurological:     Mental Status: She is alert and oriented to person, place, and time.  Psychiatric:        Mood and Affect: Mood normal.    Diabetic Foot Exam - Simple   Simple Foot Form Visual Inspection No deformities, no ulcerations, no other skin breakdown bilaterally: Yes Sensation Testing Intact to touch and monofilament testing bilaterally: Yes Pulse Check Posterior Tibialis and Dorsalis pulse intact bilaterally: Yes Comments      CMP Latest Ref Rng & Units 05/10/2020 12/22/2019 05/11/2019  Glucose 65 - 99 mg/dL 125(H) 195(H) 219(H)  BUN 8 - 27 mg/dL '9 8 10  ' Creatinine 0.57 - 1.00 mg/dL 0.55(L) 0.59 0.61  Sodium 134 - 144 mmol/L 138 137 139  Potassium 3.5 - 5.2 mmol/L 4.5 3.9 4.3  Chloride 96 - 106 mmol/L 100 97 102  CO2 20 - 29 mmol/L '27 26 24  ' Calcium 8.7 - 10.3 mg/dL 9.0 9.6 9.1  Total Protein 6.0 - 8.5 g/dL - 7.4 6.9  Total Bilirubin 0.0 - 1.2 mg/dL - 1.0 1.0  Alkaline Phos 39 - 117 IU/L - 141(H) 119(H)  AST 0 - 40 IU/L - 26 24  ALT 0 - 32 IU/L - 28 25    Lipid Panel     Component Value Date/Time   CHOL 151 05/11/2019 1031   TRIG 87 05/11/2019 1031   HDL 45 05/11/2019 1031   CHOLHDL 3.4 05/11/2019 1031   CHOLHDL 5.2 (H) 02/06/2016 1557   VLDL 42 (H) 02/06/2016 1557   LDLCALC 89 05/11/2019 1031    CBC    Component Value Date/Time   WBC 8.7 09/26/2017 1943   RBC 4.19 09/26/2017 1943   HGB 12.5 09/26/2017 1943   HCT 37.7 09/26/2017 1943   PLT 309 09/26/2017 1943   MCV 90.0 09/26/2017 1943   MCH 29.8 09/26/2017  1943   MCHC 33.2 09/26/2017 1943   RDW 13.0 09/26/2017 1943   LYMPHSABS 4.0 09/04/2014 2036   MONOABS 0.7 09/04/2014 2036   EOSABS 0.1 09/04/2014 2036   BASOSABS 0.0 09/04/2014 2036    Lab Results  Component Value Date  HGBA1C 11.6 (A) 10/03/2020    Assessment & Plan:  1. Type 2 diabetes mellitus with hyperglycemia, without long-term current use of insulin (HCC) Uncontrolled with A1c of 11.6, up from 8.6 previously; goal is less than 7.0 Blood sugar of 415, patient is at high risk of progressing to DKA.  NovoLog 20 units administered and blood sugar repeated after 45 minutes. Victoza added to regimen, Lantus increased by 5 units She will follow up with the Pharm.D. to evaluate her blood sugar and titrate Victoza accordingly Counseled on Diabetic diet, my plate method, 329 minutes of moderate intensity exercise/week Blood sugar logs with fasting goals of 80-120 mg/dl, random of less than 180 and in the event of sugars less than 60 mg/dl or greater than 400 mg/dl encouraged to notify the clinic. Advised on the need for annual eye exams, annual foot exams, Pneumonia vaccine. - POCT glucose (manual entry) - POCT glycosylated hemoglobin (Hb A1C) - insulin aspart (novoLOG) injection 20 Units - POCT glucose (manual entry) - TRUEplus Lancets 28G MISC; Check blood sugar TID  Dispense: 100 each; Refill: 12 - insulin glargine (LANTUS SOLOSTAR) 100 UNIT/ML Solostar Pen; Inject 35 Units into the skin daily at 10 pm.  Dispense: 30 mL; Refill: 6 - metFORMIN (GLUCOPHAGE) 1000 MG tablet; Take 1 tablet (1,000 mg total) by mouth 2 (two) times daily with a meal.  Dispense: 60 tablet; Refill: 6 - glipiZIDE (GLUCOTROL) 10 MG tablet; Take 1 tablet (10 mg total) by mouth 2 (two) times daily before a meal.  Dispense: 60 tablet; Refill: 6 - Insulin Pen Needle (TRUEPLUS 5-BEVEL PEN NEEDLES) 31G X 8 MM MISC; USE AS DIRECTED  Dispense: 100 each; Refill: 2 - Lipid panel - Microalbumin / creatinine urine ratio -  CMP14+EGFR - liraglutide (VICTOZA) 18 MG/3ML SOPN; Start 0.86m SQ once a day for 7 days, then increase to 1.242monce a day then increase to 1.8 mg once daily thereafter  Dispense: 9 mL; Refill: 3  2. Chronic left-sided low back pain without sciatica Advised to apply heat - meloxicam (MOBIC) 7.5 MG tablet; Take 1 tablet (7.5 mg total) by mouth daily.  Dispense: 30 tablet; Refill: 2 - tiZANidine (ZANAFLEX) 4 MG tablet; Take 1 tablet (4 mg total) by mouth every 8 (eight) hours as needed for muscle spasms.  Dispense: 60 tablet; Refill: 2  3. Hyperlipidemia associated with type 2 diabetes mellitus (HCStevens PointWe will check lipid panel today Low-cholesterol diet - atorvastatin (LIPITOR) 80 MG tablet; Take 1 tablet (80 mg total) by mouth daily.  Dispense: 30 tablet; Refill: 6    Meds ordered this encounter  Medications  . insulin aspart (novoLOG) injection 20 Units  . TRUEplus Lancets 28G MISC    Sig: Check blood sugar TID    Dispense:  100 each    Refill:  12  . insulin glargine (LANTUS SOLOSTAR) 100 UNIT/ML Solostar Pen    Sig: Inject 35 Units into the skin daily at 10 pm.    Dispense:  30 mL    Refill:  6  . meloxicam (MOBIC) 7.5 MG tablet    Sig: Take 1 tablet (7.5 mg total) by mouth daily.    Dispense:  30 tablet    Refill:  2  . metFORMIN (GLUCOPHAGE) 1000 MG tablet    Sig: Take 1 tablet (1,000 mg total) by mouth 2 (two) times daily with a meal.    Dispense:  60 tablet    Refill:  6  . tiZANidine (ZANAFLEX) 4 MG tablet  Sig: Take 1 tablet (4 mg total) by mouth every 8 (eight) hours as needed for muscle spasms.    Dispense:  60 tablet    Refill:  2  . glipiZIDE (GLUCOTROL) 10 MG tablet    Sig: Take 1 tablet (10 mg total) by mouth 2 (two) times daily before a meal.    Dispense:  60 tablet    Refill:  6  . atorvastatin (LIPITOR) 80 MG tablet    Sig: Take 1 tablet (80 mg total) by mouth daily.    Dispense:  30 tablet    Refill:  6  . Insulin Pen Needle (TRUEPLUS 5-BEVEL PEN  NEEDLES) 31G X 8 MM MISC    Sig: USE AS DIRECTED    Dispense:  100 each    Refill:  2  . liraglutide (VICTOZA) 18 MG/3ML SOPN    Sig: Start 0.12m SQ once a day for 7 days, then increase to 1.259monce a day then increase to 1.8 mg once daily thereafter    Dispense:  9 mL    Refill:  3    Follow-up: Return in about 2 weeks (around 10/17/2020) for Blood sugar log reviewed with LuLurena JoinerPCP 3 months.       EnCharlott RakesMD, FAAFP. CoPinellas Surgery Center Ltd [facility] Surgerynd WeBoyertownrGrey EagleNCArlington 10/03/2020, 4:55 PM

## 2020-10-03 NOTE — Progress Notes (Signed)
Having pain in lower left side of back. Out of insulin for 1 week.

## 2020-10-05 ENCOUNTER — Other Ambulatory Visit: Payer: Self-pay | Admitting: Family Medicine

## 2020-10-05 ENCOUNTER — Other Ambulatory Visit: Payer: Self-pay | Admitting: Pharmacist

## 2020-10-05 DIAGNOSIS — E1165 Type 2 diabetes mellitus with hyperglycemia: Secondary | ICD-10-CM

## 2020-10-05 DIAGNOSIS — Z794 Long term (current) use of insulin: Secondary | ICD-10-CM

## 2020-10-05 MED ORDER — TRUE METRIX BLOOD GLUCOSE TEST VI STRP: ORAL_STRIP | 12 refills | Status: DC

## 2020-10-10 LAB — CMP14+EGFR
ALT: 21 IU/L (ref 0–32)
AST: 18 IU/L (ref 0–40)
Albumin/Globulin Ratio: 1.6 (ref 1.2–2.2)
Albumin: 4.4 g/dL (ref 3.8–4.8)
Alkaline Phosphatase: 137 IU/L — ABNORMAL HIGH (ref 44–121)
BUN/Creatinine Ratio: 18 (ref 12–28)
BUN: 11 mg/dL (ref 8–27)
Bilirubin Total: 0.9 mg/dL (ref 0.0–1.2)
CO2: 23 mmol/L (ref 20–29)
Calcium: 9 mg/dL (ref 8.7–10.3)
Chloride: 98 mmol/L (ref 96–106)
Creatinine, Ser: 0.61 mg/dL (ref 0.57–1.00)
GFR calc Af Amer: 112 mL/min/{1.73_m2} (ref >59)
GFR calc non Af Amer: 97 mL/min/{1.73_m2} (ref >59)
Globulin, Total: 2.8 g/dL (ref 1.5–4.5)
Glucose: 341 mg/dL — ABNORMAL HIGH (ref 65–99)
Potassium: 3.9 mmol/L (ref 3.5–5.2)
Sodium: 135 mmol/L (ref 134–144)
Total Protein: 7.2 g/dL (ref 6.0–8.5)

## 2020-10-10 LAB — MICROALBUMIN / CREATININE URINE RATIO
Creatinine, Urine: 47.3 mg/dL
Microalb/Creat Ratio: <6 mg/g creat (ref 0–29)
Microalbumin, Urine: <3 ug/mL

## 2020-10-10 LAB — LIPID PANEL
Chol/HDL Ratio: 3.2 ratio (ref 0.0–4.4)
Cholesterol, Total: 162 mg/dL (ref 100–199)
HDL: 50 mg/dL (ref >39)
LDL Chol Calc (NIH): 92 mg/dL (ref 0–99)
Triglycerides: 108 mg/dL (ref 0–149)
VLDL Cholesterol Cal: 20 mg/dL (ref 5–40)

## 2020-10-18 ENCOUNTER — Ambulatory Visit: Payer: Self-pay | Attending: Family Medicine | Admitting: Pharmacist

## 2020-10-18 ENCOUNTER — Other Ambulatory Visit: Payer: Self-pay

## 2020-10-18 ENCOUNTER — Encounter: Payer: Self-pay | Admitting: Pharmacist

## 2020-10-18 DIAGNOSIS — E1165 Type 2 diabetes mellitus with hyperglycemia: Secondary | ICD-10-CM

## 2020-10-18 DIAGNOSIS — Z794 Long term (current) use of insulin: Secondary | ICD-10-CM

## 2020-10-18 LAB — GLUCOSE, POCT (MANUAL RESULT ENTRY): POC Glucose: 89 mg/dl (ref 70–99)

## 2020-10-18 NOTE — Progress Notes (Signed)
    S:    PCP: Dr. Margarita Rana  Patient arrives in good spirits.  Presents for diabetes evaluation, education, and management. Patient was referred and last seen by Primary Care Provider on 10/03/2020. At that visit, her A1c was found to be 11.6 (up from 8.6). She endorsed compliance with Lantus prior to running out 1 week before that appointment. Dr. Margarita Rana refilled Lantus and started Victoza.  Patient reports Diabetes was diagnosed ~5 years ago.   Family/Social History:  - FHx: no pertinent positives  - Tobacco: never smoker  - Alcohol: denies use  Insurance coverage/medication affordability: self pay  Medication adherence reported, however, she started taking 1.8 mg daily of Victoza. She never took the 0.6 or 1.2 mg daily doses.    Current diabetes medications include: Lantus 35 units daily, Victoza 1.8 mg daily, metformin 1000 mg BID  Patient denies hypoglycemic events.  Patient reported dietary habits: - Pt denies NV, abdominal pain - She tries to limit carbohydrates. She does not consume sweets.   Patient-reported exercise habits:  - Exercises daily on a "machine at home"    Patient denies nocturia (nighttime urination).  Patient denies neuropathy (nerve pain). Patient denies visual changes. Patient reports self foot exams.     O:  POCT: 89  Lab Results  Component Value Date   HGBA1C 11.6 (A) 10/03/2020   There were no vitals filed for this visit.  Lipid Panel     Component Value Date/Time   CHOL 162 10/03/2020 1639   TRIG 108 10/03/2020 1639   HDL 50 10/03/2020 1639   CHOLHDL 3.2 10/03/2020 1639   CHOLHDL 5.2 (H) 02/06/2016 1557   VLDL 42 (H) 02/06/2016 1557   LDLCALC 92 10/03/2020 1639   Home fasting blood sugars: 104 - 110s  2 hour post-meal/random blood sugars: 140-150.   Clinical Atherosclerotic Cardiovascular Disease (ASCVD): No  The 10-year ASCVD risk score Mikey Bussing DC Jr., et al., 2013) is: 6.1%   Values used to calculate the score:     Age: 65  years     Sex: Female     Is Non-Hispanic African American: No     Diabetic: Yes     Tobacco smoker: No     Systolic Blood Pressure: 076 mmHg     Is BP treated: No     HDL Cholesterol: 50 mg/dL     Total Cholesterol: 162 mg/dL    A/P: Diabetes longstanding currently uncontrolled based on A1c. Home CBGs are now at goal. Patient is able to verbalize appropriate hypoglycemia management plan. Medication adherence appears appropriate. She never did take the 0.6 or 1.2 mg doses of Victoza, but thankfully she did not experience any GI side effects. No medication changes today.  -Continued  Lantus 35 units daily. -Continued Victoza 1.8 mg daily. -Continued metformin 1000 mg BID. -Extensively discussed pathophysiology of diabetes, recommended lifestyle interventions, dietary effects on blood sugar control -Counseled on s/sx of and management of hypoglycemia -Next A1C anticipated 12/2020.   Written patient instructions provided.  Total time in face to face counseling 30 minutes.   Follow up Pharmacist Clinic Visit in 1 month.    Benard Halsted, PharmD, [place], [place] [phone]

## 2020-11-19 ENCOUNTER — Other Ambulatory Visit: Payer: Self-pay

## 2020-11-21 ENCOUNTER — Ambulatory Visit: Payer: No Typology Code available for payment source | Admitting: Pharmacist

## 2020-12-01 ENCOUNTER — Other Ambulatory Visit: Payer: Self-pay

## 2020-12-01 MED FILL — Metformin HCl Tab 1000 MG: ORAL | 30 days supply | Qty: 60 | Fill #0 | Status: AC

## 2020-12-27 ENCOUNTER — Other Ambulatory Visit: Payer: Self-pay | Admitting: Family Medicine

## 2020-12-27 DIAGNOSIS — E1165 Type 2 diabetes mellitus with hyperglycemia: Secondary | ICD-10-CM

## 2020-12-27 NOTE — Telephone Encounter (Signed)
Copied from [place] [phone]. Topic: Quick Communication - Rx Refill/Question >> Dec 27, 2020  4:06 PM Tessa Lerner A wrote: Medication: Rx #: [id number]  metFORMIN (GLUCOPHAGE) 1000 MG tablet  Has the patient contacted their pharmacy? Patient has attempted to contact their pharmacy by phone and been unsuccessful.  Preferred Pharmacy (with phone number or street name): [facility] [address]  Phone:  [phone] Fax:  [phone]  Agent: Please be advised that RX refills may take up to 3 business days. We ask that you follow-up with your pharmacy.

## 2020-12-28 ENCOUNTER — Other Ambulatory Visit: Payer: Self-pay

## 2020-12-28 MED FILL — Metformin HCl Tab 1000 MG: ORAL | 30 days supply | Qty: 60 | Fill #1 | Status: AC

## 2021-01-03 ENCOUNTER — Ambulatory Visit: Payer: No Typology Code available for payment source | Admitting: Family Medicine

## 2021-02-06 ENCOUNTER — Other Ambulatory Visit: Payer: Self-pay | Admitting: Family Medicine

## 2021-02-06 ENCOUNTER — Other Ambulatory Visit: Payer: Self-pay

## 2021-02-06 DIAGNOSIS — E1165 Type 2 diabetes mellitus with hyperglycemia: Secondary | ICD-10-CM

## 2021-02-06 MED ORDER — METFORMIN HCL 1000 MG PO TABS
ORAL_TABLET | Freq: Two times a day (BID) | ORAL | 3 refills | Status: DC
  Filled 2021-02-06: qty 60, 30d supply, fill #0
  Filled 2021-03-13: qty 60, 30d supply, fill #1
  Filled 2021-04-10: qty 60, 30d supply, fill #2
  Filled 2021-05-16: qty 60, 30d supply, fill #3

## 2021-02-06 NOTE — Telephone Encounter (Signed)
Last seen 3 months ago . Future visit in 1 week

## 2021-02-06 NOTE — Telephone Encounter (Signed)
Medication: Rx #: [id number] metFORMIN (GLUCOPHAGE) 1000 MG tablet [[id number]]    Has the patient contacted their pharmacy? YES  (Agent: If no, request that the patient contact the pharmacy for the refill.) (Agent: If yes, when and what did the pharmacy advise?)  Preferred Pharmacy (with phone number or street name): [facility] [address] [place] [address] Phone: [phone] Fax: [phone] Hours: M-F 8:30a-5:30p    Agent: Please be advised that RX refills may take up to 3 business days. We ask that you follow-up with your pharmacy.

## 2021-02-07 ENCOUNTER — Other Ambulatory Visit: Payer: Self-pay

## 2021-02-08 ENCOUNTER — Other Ambulatory Visit: Payer: Self-pay

## 2021-02-15 ENCOUNTER — Ambulatory Visit: Payer: No Typology Code available for payment source | Admitting: Family Medicine

## 2021-02-17 ENCOUNTER — Ambulatory Visit: Payer: No Typology Code available for payment source

## 2021-02-24 ENCOUNTER — Other Ambulatory Visit: Payer: Self-pay

## 2021-03-09 ENCOUNTER — Ambulatory Visit: Payer: No Typology Code available for payment source | Admitting: Physician Assistant

## 2021-03-13 ENCOUNTER — Other Ambulatory Visit: Payer: Self-pay

## 2021-03-14 ENCOUNTER — Other Ambulatory Visit: Payer: Self-pay

## 2021-03-21 ENCOUNTER — Telehealth: Payer: No Typology Code available for payment source | Admitting: Family Medicine

## 2021-03-21 NOTE — Telephone Encounter (Signed)
Exam results has been placed in PCP folder.

## 2021-03-21 NOTE — Telephone Encounter (Signed)
Pt has next appt available in person for the 22nd of this month. Pt has brought Post Visit notes from Dr. Maryjane Hurter at [facility] [place] for PCP to review. Specifically states: Need to see Diabetic PCP to control. Need to see Diabetic retinal specialist for further control.  Copies of this was made and  placed in PCP's bin.  Pt states she currently cannot see out of her Right eye. Please advise and thank you

## 2021-03-22 NOTE — Telephone Encounter (Signed)
Are you able to assist in getting her to an Ophthalmologist? Service for the blind? 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 Thanks

## 2021-03-27 NOTE — Telephone Encounter (Signed)
Patient has a CAFA appointment with Clifton James on 03/31/21. Pending her approval for [facility], we can submit a referral to Alinda Sierras for an ophthalmologist.

## 2021-03-27 NOTE — Telephone Encounter (Signed)
Can we please try service for the blind.  She has total right eye visual loss?  Thank you

## 2021-03-29 NOTE — Telephone Encounter (Signed)
Call placed to [facility] for the [place] # [phone], spoke to Daleen Snook who said that the local [employer] [employer] need to be contacted to refer a patient for assistance.   Calls placed to [place] [employer]: Otilio Jefferson  # [phone] and Kathrynn Speed # [phone], messages left on both numbers requesting a call back to this CM.

## 2021-03-30 NOTE — Telephone Encounter (Signed)
Call received from [facility] [place]. She explained that they are able to assist uninsured patients but they must be a [place] citizen or have a green card.  She requested that the patient contact her for further assistance.   Call placed to patient with assistance of Spanish Interpreter # [phone]/[employer].  She said she is not a [place] citizen and does not have a green card.  She only has a [place] passport. Instructed her to call Reuben Likes # [phone] and inquire if she is able to provide any assistance/ guidance with obtaining a visit with a retina specialist despite not having a green card or being a [place] citizen.   Encouraged the patient to have an [place] interpreter with her when she places the call.  She said she understood and she will call [place].

## 2021-03-31 ENCOUNTER — Ambulatory Visit: Payer: Self-pay | Attending: Family Medicine

## 2021-03-31 ENCOUNTER — Other Ambulatory Visit: Payer: Self-pay

## 2021-04-05 NOTE — Telephone Encounter (Signed)
Please accommodate pt with any available providers.   Copied from [place] [id number]. Topic: General - Other >> Mar 29, 2021  2:35 PM Leward Quan A wrote: Reason for CRM: Patient called in with an interpreter she became very emotional because she stated that she is going blind and have to work. And need to be seen before 04/10/21.  Please call patient at Ph# [phone]

## 2021-04-10 ENCOUNTER — Other Ambulatory Visit: Payer: Self-pay

## 2021-04-10 ENCOUNTER — Encounter: Payer: Self-pay | Admitting: Family Medicine

## 2021-04-10 ENCOUNTER — Ambulatory Visit: Payer: Self-pay | Attending: Family Medicine | Admitting: Family Medicine

## 2021-04-10 DIAGNOSIS — Z794 Long term (current) use of insulin: Secondary | ICD-10-CM

## 2021-04-10 DIAGNOSIS — H5462 Unqualified visual loss, left eye, normal vision right eye: Secondary | ICD-10-CM

## 2021-04-10 DIAGNOSIS — E1165 Type 2 diabetes mellitus with hyperglycemia: Secondary | ICD-10-CM

## 2021-04-10 MED FILL — Liraglutide Soln Pen-injector 18 MG/3ML (6 MG/ML): SUBCUTANEOUS | 30 days supply | Qty: 9 | Fill #0 | Status: AC

## 2021-04-10 MED FILL — Insulin Glargine Soln Pen-Injector 100 Unit/ML: SUBCUTANEOUS | 25 days supply | Qty: 9 | Fill #0 | Status: AC

## 2021-04-10 NOTE — Progress Notes (Signed)
States that she is losing her vision and would like referral to eye doctor.  Pt has CAFA

## 2021-04-10 NOTE — Progress Notes (Signed)
Virtual Visit via Telephone Note  I connected with Margaret Austin, on 04/10/2021 at 11:07 AM by telephone due to the COVID-19 pandemic and verified that I am speaking with the correct person using two identifiers.   Consent: I discussed the limitations, risks, security and privacy concerns of performing an evaluation and management service by telephone and the availability of in person appointments. I also discussed with the patient that there may be a patient responsible charge related to this service. The patient expressed understanding and agreed to proceed.   Location of Patient: Environmental education officer of Provider: Clinic   Persons participating in Telemedicine visit: Margaret Austin Dr. Margarita Rana     History of Present Illness: Margaret Austin is a 65 y.o. year old female with a history of Diabetes Mellitus type 2 (A1c 11.6), Hyperlipidemia. She scheduled this visit to discuss visual concerns. She is loosing vision in her L eye  over the last 2 weeks and is now noticing her vision in her right eyes being affected as well. She went [facility] and was advised she needed to see a Retina specialist. She had previously called and I had the RN Case Manager reach out to her she was advised to call [place] at [phone].  Fasting sugars are around 118. She has been on 35 units at bedtime but she has not been taking Victoza as she states she ran out but never picked it up.   Past Medical History:  Diagnosis Date   Diabetes mellitus without complication ([place])    No Known Allergies  Current Outpatient Medications on File Prior to Visit  Medication Sig Dispense Refill   atorvastatin (LIPITOR) 80 MG tablet TAKE 1 TABLET (80 MG TOTAL) BY MOUTH DAILY. 30 tablet 6   Blood Glucose Monitoring Suppl (TRUE METRIX METER) w/Device KIT Used as directed, 3 times daily. 1 kit 0   docusate sodium (COLACE) 100 MG capsule Take 100 mg by mouth daily.  Reported on 01/09/2016     glipiZIDE (GLUCOTROL) 10 MG tablet TAKE 1 TABLET (10 MG TOTAL) BY MOUTH 2 (TWO) TIMES DAILY BEFORE A MEAL. 60 tablet 6   glucose blood test strip USE AS INSTRUCTED, 3 TIMES DAILY 100 strip 12   hydrocortisone 2.5 % cream Apply topically 2 (two) times daily. 30 g 0   insulin glargine (LANTUS) 100 UNIT/ML Solostar Pen INJECT 35 UNITS INTO THE SKIN DAILY AT 10 PM. 30 mL 6   Insulin Pen Needle 31G X 8 MM MISC USE AS DIRECTED 100 each 2   liraglutide (VICTOZA) 18 MG/3ML SOPN START 0.6MG SQ ONCE A DAY FOR 7 DAYS, THEN INCREASE TO 1.2MG ONCE A DAY THEN INCREASE TO 1.8 MG ONCE DAILY THEREAFTER 9 mL 3   meloxicam (MOBIC) 7.5 MG tablet TAKE 1 TABLET (7.5 MG TOTAL) BY MOUTH DAILY. 30 tablet 2   metFORMIN (GLUCOPHAGE) 1000 MG tablet TAKE 1 TABLET (1,000 MG TOTAL) BY MOUTH 2 (TWO) TIMES DAILY WITH A MEAL. 60 tablet 3   tiZANidine (ZANAFLEX) 4 MG tablet TAKE 1 TABLET (4 MG TOTAL) BY MOUTH EVERY 8 (EIGHT) HOURS AS NEEDED FOR MUSCLE SPASMS. 60 tablet 2   TRUEplus Lancets 28G MISC USAR A CHEQUEAR LA SANGRE 3 VECES AL DIA 100 each 12   fluticasone (FLONASE) 50 MCG/ACT nasal spray Place 2 sprays into both nostrils daily. (Patient not taking: No sig reported) 16 g 5   No current facility-administered medications on file prior to  visit.    ROS: See HPI  Observations/Objective: Awake, alert, oriented x3 Not in acute distress Normal mood  CMP Latest Ref Rng & Units 10/03/2020 05/10/2020 12/22/2019  Glucose 65 - 99 mg/dL 341(H) 125(H) 195(H)  BUN 8 - 27 mg/dL _0 Creatinine 0.57 - 1.00 mg/dL 0.61 0.55(L) 0.59  Sodium 134 - 144 mmol/L 135 138 137  Potassium 3.5 - 5.2 mmol/L 3.9 4.5 3.9  Chloride 96 - 106 mmol/L 98 100 97  CO2 20 - 29 mmol/L _1 Calcium 8.7 - 10.3 mg/dL 9.0 9.0 9.6  Total Protein 6.0 - 8.5 g/dL 7.2 - 7.4  Total Bilirubin 0.0 - 1.2 mg/dL 0.9 - 1.0  Alkaline Phos 44 - 121 IU/L 137(H) - 141(H)  AST 0 - 40 IU/L 18 - 26  ALT 0 - 32 IU/L 21 - 28    Lipid Panel      Component Value Date/Time   CHOL 162 10/03/2020 1639   TRIG 108 10/03/2020 1639   HDL 50 10/03/2020 1639   CHOLHDL 3.2 10/03/2020 1639   CHOLHDL 5.2 (H) 02/06/2016 1557   VLDL 42 (H) 02/06/2016 1557   LDLCALC 92 10/03/2020 1639   LABVLDL 20 10/03/2020 1639    Lab Results  Component Value Date   HGBA1C 11.6 (A) 10/03/2020    Assessment and Plan: 1. Type 2 diabetes mellitus with hyperglycemia, with long-term current use of insulin (HCC) Uncontrolled with A1c of 11.6 Will send of A1c and adjust regimen accordingly She has not been taking Victoza and has been advised to pick this up from the pharmacy - Ambulatory referral to Ophthalmology - Hemoglobin A1c - CMP14+EGFR  2. Visual loss, left eye Symptoms are suspicious for retinal detachment -ongoing for the last 2 weeks Refer to ophthalmology-she agrees to pay for services as she has no medical coverage Will have the referral coordinator to expedite this and she has also been advised to present to the ED if symptoms worsen.   Follow Up Instructions: 3 months   I discussed the assessment and treatment plan with the patient. The patient was provided an opportunity to ask questions and all were answered. The patient agreed with the plan and demonstrated an understanding of the instructions.   The patient was advised to call back or seek an in-person evaluation if the symptoms worsen or if the condition fails to improve as anticipated.     I provided 16 minutes total of non-face-to-face time during this encounter.   Charlott Rakes, MD, FAAFP. [facility] [place], [place]   04/10/2021, 11:07 AM

## 2021-04-11 LAB — CMP14+EGFR
ALT: 18 IU/L (ref 0–32)
AST: 22 IU/L (ref 0–40)
Albumin/Globulin Ratio: 1.6 (ref 1.2–2.2)
Albumin: 4.5 g/dL (ref 3.8–4.8)
Alkaline Phosphatase: 128 IU/L — ABNORMAL HIGH (ref 44–121)
BUN/Creatinine Ratio: 21 (ref 12–28)
BUN: 12 mg/dL (ref 8–27)
Bilirubin Total: 0.5 mg/dL (ref 0.0–1.2)
CO2: 23 mmol/L (ref 20–29)
Calcium: 9.2 mg/dL (ref 8.7–10.3)
Chloride: 102 mmol/L (ref 96–106)
Creatinine, Ser: 0.57 mg/dL (ref 0.57–1.00)
Globulin, Total: 2.9 g/dL (ref 1.5–4.5)
Glucose: 80 mg/dL (ref 65–99)
Potassium: 4.7 mmol/L (ref 3.5–5.2)
Sodium: 139 mmol/L (ref 134–144)
Total Protein: 7.4 g/dL (ref 6.0–8.5)
eGFR: 103 mL/min/{1.73_m2} (ref >59)

## 2021-04-11 LAB — HEMOGLOBIN A1C
Est. average glucose Bld gHb Est-mCnc: 255 mg/dL
Hgb A1c MFr Bld: 10.5 % — ABNORMAL HIGH (ref 4.8–5.6)

## 2021-04-17 ENCOUNTER — Other Ambulatory Visit: Payer: Self-pay

## 2021-04-20 ENCOUNTER — Telehealth: Payer: Self-pay | Admitting: *Deleted

## 2021-04-20 DIAGNOSIS — H469 Unspecified optic neuritis: Secondary | ICD-10-CM

## 2021-04-20 NOTE — Addendum Note (Signed)
Addended by: Charlott Rakes on: 04/20/2021 04:25 PM   Modules accepted: Orders

## 2021-04-20 NOTE — Telephone Encounter (Signed)
Pt needling MRI

## 2021-04-20 NOTE — Telephone Encounter (Signed)
I have placed order for MRI, Margaret Austin can you please schedule urgently. Margaret Austin can you assist with urgent Neuro Ophthalmology referral - might have to be [facility]? Thank you!

## 2021-04-20 NOTE — Telephone Encounter (Signed)
Dr. Teodoro Spray, pt's optometrist calling. Agent attempted to reach practice, unable to do so. Dr. Sabra Heck reports saw pt yesterday for exam. States pt needs an MRI, and a consult with a neuro-opthalmologist.  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  Dr. Ammie Ferrier CB# if needed: [phone]

## 2021-05-01 NOTE — Telephone Encounter (Signed)
Good Afternoon   Dr Margarita Rana   The assistant of Dr  Alta Corning (Neuro ophthalmology)  call me to let me know that Mrs Margaret Austin is schedule to see him  Thursday  9/15 @ 7:40am and she is aware of that  . Have a Nice Day .

## 2021-05-02 ENCOUNTER — Telehealth: Payer: Self-pay

## 2021-05-02 NOTE — Telephone Encounter (Signed)
-----   Message from Charlott Rakes, MD sent at 04/11/2021 12:15 PM EDT ----- Please inform her that her A1c is 10.5.  This is elevated above goal of less than 7.0 and could be as a result of her running out of her Victoza.  Please advise to restart Victoza and follow-up in 3 months.

## 2021-05-02 NOTE — Telephone Encounter (Signed)
Patient name and DOB has been verified Patient was informed of lab results. Patient had no questions.  

## 2021-05-05 ENCOUNTER — Other Ambulatory Visit: Payer: Self-pay

## 2021-05-05 ENCOUNTER — Ambulatory Visit (HOSPITAL_COMMUNITY)
Admission: RE | Admit: 2021-05-05 | Discharge: 2021-05-05 | Disposition: A | Discharge status: Home / Self Care | Payer: No Typology Code available for payment source | Source: Ambulatory Visit | Attending: Family Medicine | Admitting: Family Medicine

## 2021-05-05 DIAGNOSIS — H469 Unspecified optic neuritis: Secondary | ICD-10-CM | POA: Insufficient documentation

## 2021-05-05 MED ORDER — GADOBUTROL 1 MMOL/ML IV SOLN
6.5000 mL | Freq: Once | INTRAVENOUS | Status: AC | PRN
  Administered 2021-05-05: 6.5 mL via INTRAVENOUS

## 2021-05-16 ENCOUNTER — Other Ambulatory Visit: Payer: Self-pay

## 2021-05-16 MED FILL — Liraglutide Soln Pen-injector 18 MG/3ML (6 MG/ML): SUBCUTANEOUS | 80 days supply | Qty: 24 | Fill #1 | Status: AC

## 2021-05-16 MED FILL — Meloxicam Tab 7.5 MG: ORAL | 30 days supply | Qty: 30 | Fill #0 | Status: AC

## 2021-05-16 MED FILL — Insulin Glargine Soln Pen-Injector 100 Unit/ML: SUBCUTANEOUS | 25 days supply | Qty: 9 | Fill #1 | Status: AC

## 2021-05-31 ENCOUNTER — Observation Stay (HOSPITAL_COMMUNITY)
Admission: EM | Admit: 2021-05-31 | Discharge: 2021-06-01 | Disposition: A | Discharge status: Home / Self Care | Payer: No Typology Code available for payment source | Attending: Emergency Medicine | Admitting: Emergency Medicine

## 2021-05-31 ENCOUNTER — Emergency Department (HOSPITAL_COMMUNITY): Payer: No Typology Code available for payment source

## 2021-05-31 ENCOUNTER — Encounter (HOSPITAL_COMMUNITY): Payer: Self-pay | Admitting: Emergency Medicine

## 2021-05-31 DIAGNOSIS — E1165 Type 2 diabetes mellitus with hyperglycemia: Secondary | ICD-10-CM

## 2021-05-31 DIAGNOSIS — H3322 Serous retinal detachment, left eye: Secondary | ICD-10-CM

## 2021-05-31 DIAGNOSIS — Z20822 Contact with and (suspected) exposure to covid-19: Secondary | ICD-10-CM | POA: Insufficient documentation

## 2021-05-31 DIAGNOSIS — H469 Unspecified optic neuritis: Secondary | ICD-10-CM | POA: Diagnosis present

## 2021-05-31 DIAGNOSIS — H44119 Panuveitis, unspecified eye: Secondary | ICD-10-CM | POA: Diagnosis present

## 2021-05-31 DIAGNOSIS — R519 Headache, unspecified: Principal | ICD-10-CM | POA: Insufficient documentation

## 2021-05-31 DIAGNOSIS — H543 Unqualified visual loss, both eyes: Secondary | ICD-10-CM | POA: Diagnosis present

## 2021-05-31 DIAGNOSIS — Z794 Long term (current) use of insulin: Secondary | ICD-10-CM

## 2021-05-31 DIAGNOSIS — E119 Type 2 diabetes mellitus without complications: Secondary | ICD-10-CM

## 2021-05-31 DIAGNOSIS — H3320 Serous retinal detachment, unspecified eye: Secondary | ICD-10-CM | POA: Diagnosis present

## 2021-05-31 DIAGNOSIS — Z7984 Long term (current) use of oral hypoglycemic drugs: Secondary | ICD-10-CM | POA: Insufficient documentation

## 2021-05-31 DIAGNOSIS — H44113 Panuveitis, bilateral: Secondary | ICD-10-CM

## 2021-05-31 LAB — CBC WITH DIFFERENTIAL/PLATELET
Abs Immature Granulocytes: 0.03 10*3/uL (ref 0.00–0.07)
Basophils Absolute: 0 10*3/uL (ref 0.0–0.1)
Basophils Relative: 1 %
Eosinophils Absolute: 0.3 10*3/uL (ref 0.0–0.5)
Eosinophils Relative: 4 %
HCT: 39.4 % (ref 36.0–46.0)
Hemoglobin: 13 g/dL (ref 12.0–15.0)
Immature Granulocytes: 0 %
Lymphocytes Relative: 38 %
Lymphs Abs: 3.2 10*3/uL (ref 0.7–4.0)
MCH: 30.7 pg (ref 26.0–34.0)
MCHC: 33 g/dL (ref 30.0–36.0)
MCV: 93.1 fL (ref 80.0–100.0)
Monocytes Absolute: 0.5 10*3/uL (ref 0.1–1.0)
Monocytes Relative: 6 %
Neutro Abs: 4.4 10*3/uL (ref 1.7–7.7)
Neutrophils Relative %: 51 %
Platelets: 285 10*3/uL (ref 150–400)
RBC: 4.23 MIL/uL (ref 3.87–5.11)
RDW: 13.2 % (ref 11.5–15.5)
WBC: 8.5 10*3/uL (ref 4.0–10.5)
nRBC: 0 % (ref 0.0–0.2)

## 2021-05-31 LAB — BASIC METABOLIC PANEL
Anion gap: 9 (ref 5–15)
BUN: 10 mg/dL (ref 8–23)
CO2: 26 mmol/L (ref 22–32)
Calcium: 8.9 mg/dL (ref 8.9–10.3)
Chloride: 103 mmol/L (ref 98–111)
Creatinine, Ser: 0.65 mg/dL (ref 0.44–1.00)
GFR, Estimated: >60 mL/min (ref >60)
Glucose, Bld: 141 mg/dL — ABNORMAL HIGH (ref 70–99)
Potassium: 3.8 mmol/L (ref 3.5–5.1)
Sodium: 138 mmol/L (ref 135–145)

## 2021-05-31 MED ORDER — ACETAMINOPHEN 325 MG PO TABS: 650.0000 mg | ORAL_TABLET | Freq: Four times a day (QID) | ORAL | Status: DC | PRN

## 2021-05-31 MED ORDER — ENOXAPARIN SODIUM 40 MG/0.4ML IJ SOSY
40.0000 mg | PREFILLED_SYRINGE | INTRAMUSCULAR | Status: DC
  Administered 2021-06-01: 40 mg via SUBCUTANEOUS
  Filled 2021-05-31: qty 0.4

## 2021-05-31 MED ORDER — PANTOPRAZOLE SODIUM 40 MG PO TBEC
40.0000 mg | DELAYED_RELEASE_TABLET | Freq: Once | ORAL | Status: AC
  Administered 2021-06-01: 40 mg via ORAL
  Filled 2021-05-31: qty 1

## 2021-05-31 MED ORDER — SODIUM CHLORIDE 0.9 % IV SOLN
1000.0000 mg | Freq: Every day | INTRAVENOUS | Status: DC
  Filled 2021-05-31: qty 16

## 2021-05-31 MED ORDER — ONDANSETRON HCL 4 MG/2ML IJ SOLN: 4.0000 mg | Freq: Four times a day (QID) | INTRAMUSCULAR | Status: DC | PRN

## 2021-05-31 MED ORDER — PANTOPRAZOLE SODIUM 40 MG PO TBEC: 40.0000 mg | DELAYED_RELEASE_TABLET | Freq: Every day | ORAL | Status: DC

## 2021-05-31 MED ORDER — ACETAMINOPHEN 650 MG RE SUPP: 650.0000 mg | Freq: Four times a day (QID) | RECTAL | Status: DC | PRN

## 2021-05-31 MED ORDER — INSULIN GLARGINE-YFGN 100 UNIT/ML ~~LOC~~ SOLN
30.0000 [IU] | Freq: Every day | SUBCUTANEOUS | Status: DC
  Administered 2021-06-01: 30 [IU] via SUBCUTANEOUS
  Filled 2021-05-31 (×2): qty 0.3

## 2021-05-31 MED ORDER — SODIUM CHLORIDE 0.9 % IV SOLN
1000.0000 mg | Freq: Once | INTRAVENOUS | Status: AC
  Administered 2021-06-01: 1000 mg via INTRAVENOUS
  Filled 2021-05-31: qty 16

## 2021-05-31 MED ORDER — INSULIN ASPART 100 UNIT/ML IJ SOLN
0.0000 [IU] | Freq: Three times a day (TID) | INTRAMUSCULAR | Status: DC
  Administered 2021-06-01: 11 [IU] via SUBCUTANEOUS

## 2021-05-31 MED ORDER — INSULIN ASPART 100 UNIT/ML IJ SOLN
4.0000 [IU] | Freq: Three times a day (TID) | INTRAMUSCULAR | Status: DC
  Administered 2021-06-01: 4 [IU] via SUBCUTANEOUS

## 2021-05-31 MED ORDER — ONDANSETRON HCL 4 MG PO TABS: 4.0000 mg | ORAL_TABLET | Freq: Four times a day (QID) | ORAL | Status: DC | PRN

## 2021-05-31 NOTE — ED Provider Notes (Signed)
[place] EMERGENCY DEPARTMENT Provider Note   CSN: [id number] Arrival date & time: 05/31/21  1653     History No chief complaint on file.   Lea Ronni Rumble is a 65 y.o. female with a past medical history of diabetes, hyperlipidemia and GERD presenting today with a complaint of headache and blurry vision.  Patient reports for the past month she has had a headache located on the front and sides of her head and associated blurry vision.  She feels as though this has been worsening over the past month.  Says that everything looks yellow and she sees black dots sometimes.  She fell due to her poor vision yesterday.  Pain is worsened with eye movements. Was given meloxicam which relieves her symptoms. Finished a course of oral steroids prescribed by ophthalmology last month.    Past Medical History:  Diagnosis Date   Diabetes mellitus without complication [facility])     Patient Active Problem List   Diagnosis Date Noted   Osteoarthritis 12/07/2016   Hyperlipidemia 02/07/2016   Obesity 02/06/2016   Low back pain with right-sided sciatica 01/09/2016   Diabetes ([place]) 09/15/2013   Rash 09/15/2013   GERD (gastroesophageal reflux disease) 09/15/2013    History reviewed. No pertinent surgical history.   OB History     Gravida  3   Para      Term      Preterm      AB      Living  3      SAB      IAB      Ectopic      Multiple      Live Births  3           No family history on file.  Social History   Tobacco Use   Smoking status: Never   Smokeless tobacco: Never  Vaping Use   Vaping Use: Never used  Substance Use Topics   Alcohol use: No   Drug use: No    Home Medications Prior to Admission medications   Medication Sig Start Date End Date Taking? Authorizing Provider  atorvastatin (LIPITOR) 80 MG tablet TAKE 1 TABLET (80 MG TOTAL) BY MOUTH DAILY. 10/03/20 10/03/21  Charlott Rakes, MD  Blood Glucose Monitoring Suppl (TRUE METRIX  METER) w/Device KIT Used as directed, 3 times daily. 05/13/18   Charlott Rakes, MD  docusate sodium (COLACE) 100 MG capsule Take 100 mg by mouth daily. Reported on 01/09/2016    [provider]  fluticasone (FLONASE) 50 MCG/ACT nasal spray Place 2 sprays into both nostrils daily. Patient not taking: No sig reported 09/12/17   Charlott Rakes, MD  glipiZIDE (GLUCOTROL) 10 MG tablet TAKE 1 TABLET (10 MG TOTAL) BY MOUTH 2 (TWO) TIMES DAILY BEFORE A MEAL. 10/03/20 10/03/21  Charlott Rakes, MD  glucose blood test strip USE AS INSTRUCTED, 3 TIMES DAILY 10/05/20 10/05/21  Charlott Rakes, MD  hydrocortisone 2.5 % cream Apply topically 2 (two) times daily. 03/12/17   Charlott Rakes, MD  insulin glargine (LANTUS) 100 UNIT/ML Solostar Pen INJECT 35 UNITS INTO THE SKIN DAILY AT 10 PM. 10/03/20 10/03/21  Charlott Rakes, MD  Insulin Pen Needle 31G X 8 MM MISC USE AS DIRECTED 10/03/20 10/03/21  Newlin, Charlane Ferretti, MD  liraglutide (VICTOZA) 18 MG/3ML SOPN START 0.6MG SQ ONCE A DAY FOR 7 DAYS, THEN INCREASE TO 1.2MG ONCE A DAY THEN INCREASE TO 1.8 MG ONCE DAILY THEREAFTER 10/03/20 10/03/21  Charlott Rakes, MD  meloxicam (  MOBIC) 7.5 MG tablet TAKE 1 TABLET (7.5 MG TOTAL) BY MOUTH DAILY. 10/03/20 10/03/21  Charlott Rakes, MD  metFORMIN (GLUCOPHAGE) 1000 MG tablet TAKE 1 TABLET (1,000 MG TOTAL) BY MOUTH 2 (TWO) TIMES DAILY WITH A MEAL. 02/06/21 02/06/22  Charlott Rakes, MD  tiZANidine (ZANAFLEX) 4 MG tablet TAKE 1 TABLET (4 MG TOTAL) BY MOUTH EVERY 8 (EIGHT) HOURS AS NEEDED FOR MUSCLE SPASMS. 10/03/20 10/03/21  Charlott Rakes, MD  TRUEplus Lancets 28G MISC USAR A CHEQUEAR LA SANGRE 3 VECES AL DIA 10/03/20 10/03/21  Charlott Rakes, MD    Allergies    Patient has no known allergies.  Review of Systems   Review of Systems  Constitutional:  Negative for chills and fever.  Eyes:  Positive for pain, redness and visual disturbance. Negative for discharge and itching.  Musculoskeletal:  Positive for gait problem.   Neurological:  Positive for headaches.  All other systems reviewed and are negative.  Physical Exam Updated Vital Signs BP (!) 126/50 (BP Location: Left Arm)   Pulse 71   Temp 99 F (37.2 C)   Resp 16   SpO2 97%   Physical Exam Vitals and nursing note reviewed.  Constitutional:      Appearance: Normal appearance.  HENT:     Head: Normocephalic and atraumatic.  Eyes:     Extraocular Movements: Extraocular movements intact.  Pulmonary:     Effort: Pulmonary effort is normal. No respiratory distress.  Skin:    Findings: No rash.  Neurological:     Mental Status: She is alert.  Psychiatric:        Mood and Affect: Mood normal.    ED Results / Procedures / Treatments   Labs (all labs ordered are listed, but only abnormal results are displayed) Labs Reviewed  BASIC METABOLIC PANEL - Abnormal; Notable for the following components:      Result Value   Glucose, Bld 141 (*)    All other components within normal limits  CBC WITH DIFFERENTIAL/PLATELET    EKG None  Radiology CT Head Wo Contrast  Result Date: 05/31/2021 CLINICAL DATA:  Dizziness. Nonspecific. endorses dizziness, headache and yellowing of her eyes. Endorses trouble seeing for a month. MRI HEAD 05/05/21: " Abnormal enhancement and T2 signal in the left optic disc with nonvisualization of the right optic disc. This is concerning for optic neuritis. Malignancy of the left retina or optic disc is considered less likely. Please correlate with direct examination." EXAM: CT HEAD WITHOUT CONTRAST TECHNIQUE: Contiguous axial images were obtained from the base of the skull through the vertex without intravenous contrast. COMPARISON:  MR head 05/05/2021 FINDINGS: Brain: No evidence of large-territorial acute infarction. No parenchymal hemorrhage. No mass lesion. No extra-axial collection. No mass effect or midline shift. No hydrocephalus. Basilar cisterns are patent. Vascular: No hyperdense vessel. Skull: No acute fracture or  focal lesion. Sinuses/Orbits: Paranasal sinuses and mastoid air cells are clear. The orbits are unremarkable. Other: None. IMPRESSION: No acute intracranial abnormality. Electronically Signed   By: Iven Finn M.D.   On: 05/31/2021 19:38    Procedures Procedures   Medications Ordered in ED Medications - No data to display  ED Course  I have reviewed the triage vital signs and the nursing notes.  Pertinent labs & imaging results that were available during my care of the patient were reviewed by me and considered in my medical decision making (see chart for details).    MDM Rules/Calculators/A&P Patient was evaluated by me in the hallway.  She  was noted to have bloodshot eyes bilaterally.  She was interviewed with the interpreter.  Visual acuity testing was performed and patient was unable to distinguish any levels.  She was evaluated for these symptoms 2 months ago and had an MRI that revealed an "Abnormal enhancement and T2 signal in the left optic disc with nonvisualization of the right optic disc. This is concerning for optic neuritis. Malignancy of the left retina or optic disc is considered less likely. Please correlate with direct examination."  Patient's exam revealed intact extraocular movements, however patient reported pain when moving her eyes laterally.  Pain worse moving her eyes to the left.  Pupils slowly and minorly reactive to light..  I discussed the results of her MRI today.  She denied any other symptoms.  No weakness or numbness consistent with MS at this time.  Patient was referred to see ophthalmology on September 15.  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  She was given prednisone at this time and scheduled to follow-up for reevaluation.  Her A1c was 10.5 at this time.  She reported that nobody called her to establish her follow-up visit and she returned due to increased pain and problems with her vision.  Neurology was consulted who  advised that we admit the patient to the hospital, and they will follow.  Neurologist on the way to evaluate her in the emergency department and start her on steroids.  Patient made aware of this plan. DO Alcario Drought accepting physician.   Final Clinical Impression(s) / ED Diagnoses Final diagnoses:  None    Rx / DC Orders Admit to hospital with neurology follow-up.    Darliss Ridgel 05/31/21 2311    Margette Fast, MD 06/07/21 [phone]

## 2021-05-31 NOTE — ED Provider Notes (Signed)
Emergency Medicine Provider Triage Evaluation Note  Margaret Austin , a 65 y.o. female  was evaluated in triage.  Pt complains of yellow vision and blurry vision.  Is been going on for a month, she also has a severe headache and feels dizzy as if the world is spinning.  She had an MRI done, unsure what the results were.  Has not seen an eye doctor or her primary care doctor since the MRI being done..  Reports pain in both eyes, has not tried any over-the-counter drops.  Review of Systems  Positive: Bilateral eye pain, vision changes, yellow vision, blurry vision, headache, dizzy Negative: Lateralizing symptoms, nausea, vomiting  Physical Exam  BP (!) 146/77 (BP Location: Right Arm)   Pulse 83   Temp 99 F (37.2 C)   Resp 18   SpO2 100%  Gen:   Awake, no distress   Resp:  Normal effort  MSK:   Moves extremities without difficulty  Other:  Bilateral injection to the conjunctiva.  EOMI without nystagmus.  Cranial nerves III through XII are grossly intact.  Medical Decision Making  Medically screening exam initiated at 5:24 PM.  Appropriate orders placed.  Margaret Austin was informed that the remainder of the evaluation will be completed by another provider, this initial triage assessment does not replace that evaluation, and the importance of remaining in the ED until their evaluation is complete.  Will check labs.  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 MRI completed 9/17.  Results as follow  Abnormal enhancement and T2 signal in the left optic disc with nonvisualization of the right optic disc. This is concerning for optic neuritis. Malignancy of the left retina or optic disc is considered less likely. Please correlate with direct examination. 2. Normal MRI appearance of the brain for age. 3. Left mastoid effusion. No obstructing nasopharyngeal lesion is present. 4. Left sphenoid and posterior ethmoid sinus disease.   Margaret Raring,  PA-C 05/31/21 1726    Margaret Starch, MD 06/01/21 [phone]

## 2021-05-31 NOTE — H&P (Signed)
History and Physical    Margaret Austin UXL:[id number] DOB: 03/18/1956 DOA: 05/31/2021  PCP: Charlott Rakes, MD  Patient coming from: Home  I have personally briefly reviewed patient's old medical records in [facility]  Chief Complaint: Vision loss, headache  HPI: Margaret Austin is a 65 y.o. female with medical history significant of DM2.  Pt with headache and worsening blurred vision over past 2 months.  MRI 1 month ago showed "Abnormal enhancement and T2 signal in the left optic disc with nonvisualization of the right optic disc. This is concerning for optic neuritis. Malignancy of the left retina or optic disc is considered less likely. Please correlate with direct examination"  Pt then referred to neuroopthomology and saw Dr. Manuella Ghazi on 9/15.  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  Of note, poorly reactive pupils on exam at that time as well.  Started pt on PO steroid taper, which pt says seemed to help initially.  Though after she was off of taper vision began to get worse again which is whats causing her to present to ED today.  No dysuria.  Occasional GI upset.  No focal weakness nor numbness.   ED Course: Pt noted to have minimally reactive pupils on exam.   Review of Systems: As per HPI, otherwise all review of systems negative.  Past Medical History:  Diagnosis Date   Diabetes mellitus without complication ([place])     History reviewed. No pertinent surgical history.   reports that she has never smoked. She has never used smokeless tobacco. She reports that she does not drink alcohol and does not use drugs.  No Known Allergies  Family History  Problem Relation Age of Onset   Autoimmune disease Neg Hx      Prior to Admission medications   Medication Sig Start Date End Date Taking? Authorizing Provider  atorvastatin (LIPITOR) 80 MG tablet TAKE 1 TABLET (80 MG TOTAL) BY MOUTH DAILY. 10/03/20  10/03/21  Charlott Rakes, MD  Blood Glucose Monitoring Suppl (TRUE METRIX METER) w/Device KIT Used as directed, 3 times daily. 05/13/18   Charlott Rakes, MD  docusate sodium (COLACE) 100 MG capsule Take 100 mg by mouth daily. Reported on 01/09/2016    [provider]  fluticasone (FLONASE) 50 MCG/ACT nasal spray Place 2 sprays into both nostrils daily. Patient not taking: No sig reported 09/12/17   Charlott Rakes, MD  glipiZIDE (GLUCOTROL) 10 MG tablet TAKE 1 TABLET (10 MG TOTAL) BY MOUTH 2 (TWO) TIMES DAILY BEFORE A MEAL. 10/03/20 10/03/21  Charlott Rakes, MD  glucose blood test strip USE AS INSTRUCTED, 3 TIMES DAILY 10/05/20 10/05/21  Charlott Rakes, MD  hydrocortisone 2.5 % cream Apply topically 2 (two) times daily. 03/12/17   Charlott Rakes, MD  insulin glargine (LANTUS) 100 UNIT/ML Solostar Pen INJECT 35 UNITS INTO THE SKIN DAILY AT 10 PM. 10/03/20 10/03/21  Charlott Rakes, MD  Insulin Pen Needle 31G X 8 MM MISC USE AS DIRECTED 10/03/20 10/03/21  Newlin, Charlane Ferretti, MD  liraglutide (VICTOZA) 18 MG/3ML SOPN START 0.6MG SQ ONCE A DAY FOR 7 DAYS, THEN INCREASE TO 1.2MG ONCE A DAY THEN INCREASE TO 1.8 MG ONCE DAILY THEREAFTER 10/03/20 10/03/21  Charlott Rakes, MD  meloxicam (MOBIC) 7.5 MG tablet TAKE 1 TABLET (7.5 MG TOTAL) BY MOUTH DAILY. 10/03/20 10/03/21  Charlott Rakes, MD  metFORMIN (GLUCOPHAGE) 1000 MG tablet TAKE 1 TABLET (1,000 MG TOTAL) BY MOUTH 2 (TWO) TIMES DAILY  WITH A MEAL. 02/06/21 02/06/22  Charlott Rakes, MD  tiZANidine (ZANAFLEX) 4 MG tablet TAKE 1 TABLET (4 MG TOTAL) BY MOUTH EVERY 8 (EIGHT) HOURS AS NEEDED FOR MUSCLE SPASMS. 10/03/20 10/03/21  Charlott Rakes, MD  TRUEplus Lancets 28G MISC USAR A CHEQUEAR LA SANGRE 3 VECES AL DIA 10/03/20 10/03/21  Charlott Rakes, MD    Physical Exam: Vitals:   05/31/21 1719 05/31/21 2132  BP: (!) 146/77 (!) 126/50  Pulse: 83 71  Resp: 18 16  Temp: 99 F (37.2 C)   SpO2: 100% 97%    Constitutional: NAD, calm, comfortable Eyes: Pupils  minimally reactive.  Bloodshot sclera. ENMT: Mucous membranes are moist. Posterior pharynx clear of any exudate or lesions.Normal dentition.  Neck: normal, supple, no masses, no thyromegaly Respiratory: clear to auscultation bilaterally, no wheezing, no crackles. Normal respiratory effort. No accessory muscle use.  Cardiovascular: Regular rate and rhythm, no murmurs / rubs / gallops. No extremity edema. 2+ pedal pulses. No carotid bruits.  Abdomen: no tenderness, no masses palpated. No hepatosplenomegaly. Bowel sounds positive.  Musculoskeletal: no clubbing / cyanosis. No joint deformity upper and lower extremities. Good ROM, no contractures. Normal muscle tone.  Skin: no rashes, lesions, ulcers. No induration Neurologic: CN 2-12 grossly intact. Sensation intact, DTR normal. Strength 5/5 in all 4.  Psychiatric: Normal judgment and insight. Alert and oriented x 3. Normal mood.    Labs on Admission: I have personally reviewed following labs and imaging studies  CBC: Recent Labs  Lab 05/31/21 1735  WBC 8.5  NEUTROABS 4.4  HGB 13.0  HCT 39.4  MCV 93.1  PLT 387   Basic Metabolic Panel: Recent Labs  Lab 05/31/21 1735  NA 138  K 3.8  CL 103  CO2 26  GLUCOSE 141*  BUN 10  CREATININE 0.65  CALCIUM 8.9   GFR: CrCl cannot be calculated (Unknown ideal weight.). Liver Function Tests: No results for input(s): AST, ALT, ALKPHOS, BILITOT, PROT, ALBUMIN in the last 168 hours. No results for input(s): LIPASE, AMYLASE in the last 168 hours. No results for input(s): AMMONIA in the last 168 hours. Coagulation Profile: No results for input(s): INR, PROTIME in the last 168 hours. Cardiac Enzymes: No results for input(s): CKTOTAL, CKMB, CKMBINDEX, TROPONINI in the last 168 hours. BNP (last 3 results) No results for input(s): PROBNP in the last 8760 hours. HbA1C: No results for input(s): HGBA1C in the last 72 hours. CBG: No results for input(s): GLUCAP in the last 168 hours. Lipid  Profile: No results for input(s): CHOL, HDL, LDLCALC, TRIG, CHOLHDL, LDLDIRECT in the last 72 hours. Thyroid Function Tests: No results for input(s): TSH, T4TOTAL, FREET4, T3FREE, THYROIDAB in the last 72 hours. Anemia Panel: No results for input(s): VITAMINB12, FOLATE, FERRITIN, TIBC, IRON, RETICCTPCT in the last 72 hours. Urine analysis:    Component Value Date/Time   COLORURINE YELLOW 09/04/2014 2311   APPEARANCEUR CLEAR 09/04/2014 2311   LABSPEC 1.025 09/04/2014 2311   PHURINE 6.5 09/04/2014 2311   GLUCOSEU >1000 (A) 09/04/2014 2311   HGBUR NEGATIVE 09/04/2014 2311   BILIRUBINUR neg 11/15/2015 1448   KETONESUR 15 (A) 09/04/2014 2311   PROTEINUR neg 11/15/2015 1448   PROTEINUR NEGATIVE 09/04/2014 2311   UROBILINOGEN 0.2 11/15/2015 1448   UROBILINOGEN 0.2 09/04/2014 2311   NITRITE neg 11/15/2015 1448   NITRITE NEGATIVE 09/04/2014 2311   LEUKOCYTESUR Negative 11/15/2015 1448    Radiological Exams on Admission: CT Head Wo Contrast  Result Date: 05/31/2021 CLINICAL DATA:  Dizziness. Nonspecific. endorses dizziness, headache  and yellowing of her eyes. Endorses trouble seeing for a month. MRI HEAD 05/05/21: " Abnormal enhancement and T2 signal in the left optic disc with nonvisualization of the right optic disc. This is concerning for optic neuritis. Malignancy of the left retina or optic disc is considered less likely. Please correlate with direct examination." EXAM: CT HEAD WITHOUT CONTRAST TECHNIQUE: Contiguous axial images were obtained from the base of the skull through the vertex without intravenous contrast. COMPARISON:  MR head 05/05/2021 FINDINGS: Brain: No evidence of large-territorial acute infarction. No parenchymal hemorrhage. No mass lesion. No extra-axial collection. No mass effect or midline shift. No hydrocephalus. Basilar cisterns are patent. Vascular: No hyperdense vessel. Skull: No acute fracture or focal lesion. Sinuses/Orbits: Paranasal sinuses and mastoid air cells  are clear. The orbits are unremarkable. Other: None. IMPRESSION: No acute intracranial abnormality. Electronically Signed   By: Iven Finn M.D.   On: 05/31/2021 19:38    EKG: Independently reviewed.  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 ? Of optic neuritis on MRI last month. 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 Manuella Ghazi consult note). MS and NMO are possible but less likely. Started on PO steroid burst taper on 9/15, seemed to help some per patient until it ran out. Neuro saw pt in ED, starting empiric high dose steroids for possible optic neuritis. Empiric high dose steroids would also be a reasonable initial treatment for Harada's disease. And per pt report, it seemed like steroids were helping initially. Call Optho in AM for formal IP consult.  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 DM2 - Cont home lantus, metformin, victoza Add resistant scale SSI AC Likely to have increased insulin requirements on steroids  DVT prophylaxis: Lovenox Code Status: Full Family Communication: Family at bedside Disposition Plan: Home after cleared by neuro and optho Consults called: Neurology saw at bedside in ED.  Call optho in AM Admission status: Place in obs    Kanen Mottola, [place] [facility]  How to contact the [facility] Attending or Consulting provider [place] or covering provider during after hours [place], for this patient?  Check the care team in [facility] and look for a) attending/consulting TRH provider listed and b) the [facility] team listed Log into [email or web address]  Amion Physician Scheduling and messaging for groups and whole  hospitals  On call and physician scheduling software for group practices, residents, hospitalists and other medical providers for call, clinic, rotation and shift schedules. OnCall Enterprise is a hospital-wide system for scheduling doctors and paging doctors on call. EasyPlot is for scientific plotting and data analysis.  [email or web address]  and use [facility]'s universal password to access. If you do not have the password, please contact the hospital operator.  Locate the [facility] provider you are looking for under [facility] and page to a number that you can be directly reached. If you still have difficulty reaching the provider, please page the [facility] (Director on Call) for the Hospitalists listed on amion for assistance.  05/31/2021, 11:35 PM

## 2021-05-31 NOTE — ED Triage Notes (Signed)
Using interpreter, pt endorses dizziness, headache and yellowing of her eyes. Endorses trouble seeing for a month. MRI performed last month but pt was not told results.

## 2021-06-01 DIAGNOSIS — H543 Unqualified visual loss, both eyes: Secondary | ICD-10-CM

## 2021-06-01 LAB — BASIC METABOLIC PANEL
Anion gap: 9 (ref 5–15)
BUN: 8 mg/dL (ref 8–23)
CO2: 24 mmol/L (ref 22–32)
Calcium: 8.8 mg/dL — ABNORMAL LOW (ref 8.9–10.3)
Chloride: 106 mmol/L (ref 98–111)
Creatinine, Ser: 0.48 mg/dL (ref 0.44–1.00)
GFR, Estimated: >60 mL/min (ref >60)
Glucose, Bld: 108 mg/dL — ABNORMAL HIGH (ref 70–99)
Potassium: 3.6 mmol/L (ref 3.5–5.1)
Sodium: 139 mmol/L (ref 135–145)

## 2021-06-01 LAB — RESP PANEL BY RT-PCR (FLU A&B, COVID) ARPGX2
Influenza A by PCR: NEGATIVE
Influenza B by PCR: NEGATIVE
SARS Coronavirus 2 by RT PCR: NEGATIVE

## 2021-06-01 LAB — CBG MONITORING, ED
Glucose-Capillary: 101 mg/dL — ABNORMAL HIGH (ref 70–99)
Glucose-Capillary: 297 mg/dL — ABNORMAL HIGH (ref 70–99)
Glucose-Capillary: 94 mg/dL (ref 70–99)

## 2021-06-01 LAB — HIV ANTIBODY (ROUTINE TESTING W REFLEX): HIV Screen 4th Generation wRfx: NONREACTIVE

## 2021-06-01 LAB — CBC
HCT: 39.9 % (ref 36.0–46.0)
Hemoglobin: 13.2 g/dL (ref 12.0–15.0)
MCH: 30.5 pg (ref 26.0–34.0)
MCHC: 33.1 g/dL (ref 30.0–36.0)
MCV: 92.1 fL (ref 80.0–100.0)
Platelets: 278 10*3/uL (ref 150–400)
RBC: 4.33 MIL/uL (ref 3.87–5.11)
RDW: 13.1 % (ref 11.5–15.5)
WBC: 10.3 10*3/uL (ref 4.0–10.5)
nRBC: 0 % (ref 0.0–0.2)

## 2021-06-01 MED ORDER — METFORMIN HCL 500 MG PO TABS
1000.0000 mg | ORAL_TABLET | Freq: Two times a day (BID) | ORAL | Status: DC
  Administered 2021-06-01: 1000 mg via ORAL
  Filled 2021-06-01: qty 2

## 2021-06-01 MED ORDER — TIZANIDINE HCL 4 MG PO TABS: 4.0000 mg | ORAL_TABLET | Freq: Three times a day (TID) | ORAL | Status: DC | PRN

## 2021-06-01 MED ORDER — GLIPIZIDE 10 MG PO TABS
10.0000 mg | ORAL_TABLET | Freq: Two times a day (BID) | ORAL | Status: DC
  Filled 2021-06-01: qty 1

## 2021-06-01 MED ORDER — ATORVASTATIN CALCIUM 80 MG PO TABS
80.0000 mg | ORAL_TABLET | Freq: Every day | ORAL | Status: DC
  Administered 2021-06-01: 80 mg via ORAL
  Filled 2021-06-01: qty 2

## 2021-06-01 MED ORDER — LIRAGLUTIDE 18 MG/3ML ~~LOC~~ SOPN: 1.8000 mg | PEN_INJECTOR | Freq: Every day | SUBCUTANEOUS | Status: DC

## 2021-06-01 NOTE — Consult Note (Signed)
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  2 months ago symptoms started with headache and blurred vision.  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  The inflammation seemed to be controlled on oral steroids and dosage was tapered.  Pt finished taper however did not follow up with Dr Manuella Ghazi and symptoms have returned.     On exam vision was 20/70 in the right eye and HM in the left eye.  IOP was 16 in the right and 14 in the left.  Pupils were sluggish but no obvious RAPD was seen.  Full Ductions and versions and visual field was gross to confrontation though difficult to perform in the left eye could see movement in all  quadrants grossly.    On exam  L/L/L:  Dermatochalasis OU C/S: 1+ injection with ciliary flush OU K: Clear OU A/C: unable to determine iritis based on bedside exam though iris features were clear I: sluggish reaction but round Lens: Clear  Fundus exam was at bedside and patient had probable vitreous cell and blunting of disc margins bilaterally but retina appeared flat and intact in the periphery bilaterally.   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  Symptoms were improving however when pt finished steroid taper they rebounded.  Pt missed follow up appointment with Dr Manuella Ghazi.  Due to severity of disease pt needs to be treated by a uveitis/retina specialist.  Pt will be transferred to [facility] where she can be treated by her Uveitis specialist Dr Manuella Ghazi.  [facility] is aware of the transfer and are waiting on a bed.  They will be contacting Dr Manuella Ghazi this morning for further treatment of this patient.  Thank you for allowing me to participate in  the care of this patient.  Please feel free to contact me with any concerns.  Darleen Crocker, M.D. (Cell) [phone] (Office) [phone]

## 2021-06-01 NOTE — ED Notes (Signed)
An eye doctor has just seen

## 2021-06-01 NOTE — ED Notes (Signed)
The medications that were given were discussed with the patient via [place]

## 2021-06-01 NOTE — ED Notes (Signed)
This RN contacted [facility] for transport who asked [place] to call [facility] first. This RN called [facility] transport and stated they would send a truck as soon as possible.

## 2021-06-01 NOTE — ED Notes (Signed)
[facility] transfer service just called allowing this pt to come to their ed  [phone]

## 2021-06-01 NOTE — Plan of Care (Addendum)
Spoke with Dr. Talbert Forest: requested that we start the transfer process to [facility] since pt seeing uveitis subspecialist over there already.  Have called transfer center to start process.  No beds available at [facility] so pt getting admitted here in meantime.  Waiting on call-back from optho at [facility] to get her on wait list for bed.  Spoke with Dr. Franchot Heidelberg:  May get bed in day hospital at [facility] tomorrow.  Getting put on wait list at [facility].  Will update patient.

## 2021-06-01 NOTE — ED Notes (Signed)
I attempted to call report x 2  the phone rang an rang with no answer  purple man not  initiated by this floor

## 2021-06-01 NOTE — ED Notes (Signed)
Spoke with the charge nurse at [place] ED for report. Per Charge please send pt over.

## 2021-06-01 NOTE — ED Notes (Signed)
Bed on 2w cancelled  pt accepted at [facility]

## 2021-06-01 NOTE — Consult Note (Signed)
NEUROLOGY CONSULTATION NOTE   Date of service: June 01, 2021 Patient Name: Margaret Austin MRN:  [id number] DOB:  Sep 01, 1955 Reason for consult: "BL optic neuritis" Requesting Provider: Etta Quill, DO _ _ _   _ __   _ __ _ _  __ __   _ __   __ _  History of Present Illness  Margaret Austin is a 65 y.o. female with PMH significant for DM2 who presents with 1one an a half month hx of worsening BL vision loss. Started in the R eye with R eye pain, 2 weeks later went to the left eye. He vision is dark, yellow and she can barely see shades of things in both eyes. Was seen by ophthalmology at [facility] and then by Neuro ophthalmology. 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 She was started on oral prednisone and had MRI Brain w + w/o contrast with abnormal enhancement and T2 signal in the left optic disc with nonvisualization of the right optic disc. This is concerning for optic neuritis.    ROS   Constitutional Denies weight loss, fever and chills.   HEENT Vision changes as above, hearing is intact.  Respiratory Denies SOB and cough.   CV Denies palpitations and CP   GI Denies abdominal pain, nausea, vomiting and diarrhea.   GU Denies dysuria and urinary frequency.   MSK Denies myalgia and joint pain.   Skin Denies rash and pruritus.   Neurological Denies headache and syncope.   Psychiatric Denies recent changes in mood. Denies anxiety and depression.    Past History   Past Medical History:  Diagnosis Date   Diabetes mellitus without complication ([place])    History reviewed. No pertinent surgical history. Family History  Problem Relation Age of Onset   Autoimmune disease Neg Hx    Social History   Socioeconomic History   Marital status: Single    Spouse name: Not on file   Number of  children: Not on file   Years of education: Not on file   Highest education level: Not on file  Occupational History   Not on file  Tobacco Use   Smoking status: Never   Smokeless tobacco: Never  Vaping Use   Vaping Use: Never used  Substance and Sexual Activity   Alcohol use: No   Drug use: No   Sexual activity: Yes    Birth control/protection: None  Other Topics Concern   Not on file  Social History Narrative   ** Merged History Encounter **       Social Determinants of Health   Financial Resource Strain: Not on file  Food Insecurity: Not on file  Transportation Needs: Not on file  Physical Activity: Not on file  Stress: Not on file  Social Connections: Not on file   No Known Allergies  Medications  (Not in a hospital admission)    Vitals   Vitals:   05/31/21 1719 05/31/21 2132  BP: (!) 146/77 (!) 126/50  Pulse: 83 71  Resp: 18 16  Temp: 99 F (37.2 C)   SpO2: 100% 97%     There is no height or weight on file to calculate BMI.  Physical Exam   General: Laying comfortably in bed; in no acute distress.  HENT: Normal oropharynx and mucosa. Normal external appearance of  ears and nose.  Neck: Supple, no pain or tenderness  CV: No JVD. No peripheral edema.  Pulmonary: Symmetric Chest rise. Normal respiratory effort.  Abdomen: Soft to touch, non-tender.  Ext: No cyanosis, edema, or deformity  Skin: No rash. Normal palpation of skin.   Musculoskeletal: Normal digits and nails by inspection. No clubbing.   Neurologic Examination  Mental status/Cognition: Alert, oriented to self, place, month and year, good attention.  Speech/language: Fluent, comprehension intact, object naming intact, repetition intact.  Cranial nerves:   CN II Pupils equal and reactive to light, no VF deficits    CN III,IV,VI EOM intact, no gaze preference or deviation, no nystagmus    CN V normal sensation in V1, V2, and V3 segments bilaterally    CN VII no asymmetry, no nasolabial  fold flattening    CN VIII normal hearing to speech    CN IX & X normal palatal elevation, no uvular deviation    CN XI 5/5 head turn and 5/5 shoulder shrug bilaterally    CN XII midline tongue protrusion    Motor:  Muscle bulk: normal, tone normal, pronator drift none tremor none Mvmt Root Nerve  Muscle Right Left Comments  SA C5/6 Ax Deltoid 5 5   EF C5/6 Mc Biceps 5 5   EE C6/7/8 Rad Triceps 5 5   WF C6/7 Med FCR     WE C7/8 PIN ECU     F Ab C8/T1 U ADM/FDI 5 5   HF L1/2/3 Fem Illopsoas 5 5   KE L2/3/4 Fem Quad 5 5   DF L4/5 D Peron Tib Ant 5 5   PF S1/2 Tibial Grc/Sol 5 5    Reflexes:  Right Left Comments  Pectoralis      Biceps (C5/6) 2 2   Brachioradialis (C5/6) 2 2    Triceps (C6/7) 2 2    Patellar (L3/4) 2 2    Achilles (S1)      Hoffman      Plantar     Jaw jerk    Sensation:  Light touch Intact BL   Pin prick    Temperature    Vibration   Proprioception    Coordination/Complex Motor:  - Finger to Nose intact BL - Heel to shin intact BL - Rapid alternating movement intact BL - Gait: Stride length normal. Arm swing normal. Base width narrow.  Labs   CBC:  Recent Labs  Lab 05/31/21 1735  WBC 8.5  NEUTROABS 4.4  HGB 13.0  HCT 39.4  MCV 93.1  PLT 025    Basic Metabolic Panel:  Lab Results  Component Value Date   NA 138 05/31/2021   K 3.8 05/31/2021   CO2 26 05/31/2021   GLUCOSE 141 (H) 05/31/2021   BUN 10 05/31/2021   CREATININE 0.65 05/31/2021   CALCIUM 8.9 05/31/2021   GFRNONAA >60 05/31/2021   GFRAA 112 10/03/2020   Lipid Panel:  Lab Results  Component Value Date   LDLCALC 92 10/03/2020   HgbA1c:  Lab Results  Component Value Date   HGBA1C 10.5 (H) 04/10/2021   Urine Drug Screen: No results found for: LABOPIA, COCAINSCRNUR, LABBENZ, AMPHETMU, THCU, LABBARB  Alcohol Level No results found for: [facility]  MRI Brain: Personally reviewed and notable for optic neuritis. No T2/FLAIR white matter lesions otherwise.  Labs reviewed from  outside hospital includes: - Negative Quantiferon - Hep A IgG positive with IgM negative. - Hep C negative.  Impression   Margaret Austin  is a 65 y.o. female with PMH significant for DM2 who presents with 1.5 month hx of worsening BL vision loss. 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 In addition, her MRI shows BL optic neuritis. Althou a demyelinating disorder is on differential, her presentation would be very atypical for MS or other central demyelinating disorder. MS usually affects young, caucasian females and rarely causes uveitis or retinal detachment. She also does not have any other T2/FLAIR white matter lesions. She does not fit the revised McDonald criteria at this time.  Will get MOG Ab panel along with NMO Panel. Agree with getting ophthalmology on board. Given the importance of vision in life, I think it is reasonable to treat her with a single dose of IV Steroids 1000mg  here until ophthalmology evaluates her tomorrow.  Recommendations  - IV Solumderol 1G x 1 dose only until ophthalmology evaluates her. - Protonix 40mg  PO once - Recommend Glucose checks ACHS given steroids can worsen diabetes. - Serum MOG Ab, NMO Panel, GFAP - Agree with getting ophthalmology on board.  Plan discussed with Dr. Alcario Drought with the Hospitalist team.  ______________________________________________________________________   Thank you for the opportunity to take part in the care of this patient. If you have any further questions, please contact the neurology consultation attending.  Signed,  [place] Pager Number [phone] _ _ _   _ __   _ __ _ _  __ __   _ __   __ _

## 2021-06-01 NOTE — ED Notes (Signed)
The eye doctor is still asking the patient questions to her intermittently

## 2021-06-02 LAB — MISC LABCORP TEST (SEND OUT): Labcorp test code: 505310

## 2021-06-02 LAB — NEUROMYELITIS OPTICA AUTOAB, IGG: NMO-IgG: 3.2 U/mL — ABNORMAL HIGH (ref 0.0–3.0)

## 2021-06-07 ENCOUNTER — Other Ambulatory Visit: Payer: Self-pay

## 2021-06-08 LAB — MISC LABCORP TEST (SEND OUT): Labcorp test code: 831715

## 2021-06-08 LAB — ANTI-MYELIN ASSOC GLYCOP IGG

## 2021-06-16 ENCOUNTER — Other Ambulatory Visit: Payer: Self-pay | Admitting: Pharmacist

## 2021-06-16 ENCOUNTER — Other Ambulatory Visit: Payer: Self-pay | Admitting: Family Medicine

## 2021-06-16 ENCOUNTER — Other Ambulatory Visit: Payer: Self-pay

## 2021-06-16 DIAGNOSIS — E1165 Type 2 diabetes mellitus with hyperglycemia: Secondary | ICD-10-CM

## 2021-06-16 MED ORDER — METFORMIN HCL 1000 MG PO TABS
ORAL_TABLET | Freq: Two times a day (BID) | ORAL | 1 refills | Status: DC
  Filled 2021-06-16 (×2): qty 60, 30d supply, fill #0
  Filled 2021-08-07: qty 60, 30d supply, fill #1

## 2021-06-16 MED FILL — Insulin Glargine Soln Pen-Injector 100 Unit/ML: SUBCUTANEOUS | 25 days supply | Qty: 9 | Fill #2 | Status: AC

## 2021-06-16 MED FILL — Atorvastatin Calcium Tab 80 MG (Base Equivalent): ORAL | 30 days supply | Qty: 30 | Fill #0 | Status: AC

## 2021-08-07 ENCOUNTER — Ambulatory Visit: Payer: No Typology Code available for payment source | Admitting: Family Medicine

## 2021-08-07 ENCOUNTER — Other Ambulatory Visit: Payer: Self-pay | Admitting: Family Medicine

## 2021-08-07 ENCOUNTER — Other Ambulatory Visit: Payer: Self-pay

## 2021-08-07 DIAGNOSIS — E1165 Type 2 diabetes mellitus with hyperglycemia: Secondary | ICD-10-CM

## 2021-08-07 MED ORDER — VICTOZA 18 MG/3ML ~~LOC~~ SOPN
PEN_INJECTOR | SUBCUTANEOUS | 1 refills | Status: DC
  Filled 2021-08-07: qty 9, 30d supply, fill #0

## 2021-08-07 MED FILL — Insulin Glargine Soln Pen-Injector 100 Unit/ML: SUBCUTANEOUS | 25 days supply | Qty: 9 | Fill #3 | Status: AC

## 2021-08-07 MED FILL — Atorvastatin Calcium Tab 80 MG (Base Equivalent): ORAL | 30 days supply | Qty: 30 | Fill #1 | Status: AC

## 2021-08-07 MED FILL — Meloxicam Tab 7.5 MG: ORAL | 30 days supply | Qty: 30 | Fill #1 | Status: AC

## 2021-08-16 ENCOUNTER — Other Ambulatory Visit: Payer: Self-pay

## 2021-09-11 ENCOUNTER — Other Ambulatory Visit: Payer: Self-pay

## 2021-09-11 ENCOUNTER — Other Ambulatory Visit: Payer: Self-pay | Admitting: Family Medicine

## 2021-09-11 DIAGNOSIS — E1165 Type 2 diabetes mellitus with hyperglycemia: Secondary | ICD-10-CM

## 2021-09-11 MED ORDER — METFORMIN HCL 1000 MG PO TABS
ORAL_TABLET | Freq: Two times a day (BID) | ORAL | 1 refills | Status: DC
  Filled 2021-09-11: qty 60, 30d supply, fill #0
  Filled 2021-10-12: qty 60, 30d supply, fill #1

## 2021-09-11 MED FILL — Atorvastatin Calcium Tab 80 MG (Base Equivalent): ORAL | 30 days supply | Qty: 30 | Fill #0 | Status: AC

## 2021-09-11 MED FILL — Insulin Glargine Soln Pen-Injector 100 Unit/ML: SUBCUTANEOUS | 25 days supply | Qty: 9 | Fill #0 | Status: AC

## 2021-09-11 NOTE — Telephone Encounter (Signed)
Requested Prescriptions  Pending Prescriptions Disp Refills   metFORMIN (GLUCOPHAGE) 1000 MG tablet 60 tablet 1    Sig: TAKE 1 TABLET (1,000 MG TOTAL) BY MOUTH 2 (TWO) TIMES DAILY WITH A MEAL.     Endocrinology:  Diabetes - Biguanides Failed - 09/11/2021  4:26 PM      Failed - HBA1C is between 0 and 7.9 and within 180 days    HbA1c, POC (controlled diabetic range)  Date Value Ref Range Status  10/03/2020 11.6 (A) 0.0 - 7.0 % Final   Hgb A1c MFr Bld  Date Value Ref Range Status  04/10/2021 10.5 (H) 4.8 - 5.6 % Final    Comment:             Prediabetes: 5.7 - 6.4          Diabetes: >6.4          Glycemic control for adults with diabetes: <7.0          Passed - Cr in normal range and within 360 days    Creat  Date Value Ref Range Status  02/06/2016 0.48 (L) 0.50 - 1.05 mg/dL Final    Comment:      For patients > or = 64 years of age: The upper reference limit for Creatinine is approximately 13% higher for people identified as African-American.      Creatinine, Ser  Date Value Ref Range Status  06/01/2021 0.48 0.44 - 1.00 mg/dL Final   Creatinine, Urine  Date Value Ref Range Status  02/06/2016 143 20 - 320 mg/dL Final         Passed - eGFR in normal range and within 360 days    GFR, Est African American  Date Value Ref Range Status  02/06/2016 >89 >=60 mL/min Final   GFR calc Af Amer  Date Value Ref Range Status  10/03/2020 112 >59 mL/min/1.73 Corrected    Comment:    **In accordance with recommendations from the NKF-ASN Task force,**   [facility] is in the process of updating its eGFR calculation to the   2021 CKD-EPI creatinine equation that estimates kidney function   without a race variable.    GFR, Est Non African American  Date Value Ref Range Status  02/06/2016 >89 >=60 mL/min Final   GFR, Estimated  Date Value Ref Range Status  06/01/2021 >60 >60 mL/min Final    Comment:    (NOTE) Calculated using the CKD-EPI Creatinine Equation (2021)    eGFR   Date Value Ref Range Status  04/10/2021 103 >59 mL/min/1.73 Final         Passed - Valid encounter within last 6 months    Recent Outpatient Visits          5 months ago Type 2 diabetes mellitus with hyperglycemia, with long-term current use of insulin ([place])   [place], [place], MD   10 months ago Type 2 diabetes mellitus with hyperglycemia, with long-term current use of insulin [facility])   [place], Annie Main L, RPH-CPP   11 months ago Type 2 diabetes mellitus with hyperglycemia, without long-term current use of insulin ([place])   [place], Charlane Ferretti, MD   1 year ago Type 2 diabetes mellitus with hyperglycemia, without long-term current use of insulin ([place])   [place], Charlane Ferretti, MD   1 year ago Type 2 diabetes mellitus with hyperglycemia, without long-term current use  of insulin [facility])   [place], MD      Future Appointments            In 1 week Charlott Rakes, MD Center

## 2021-09-12 ENCOUNTER — Other Ambulatory Visit: Payer: Self-pay

## 2021-09-12 ENCOUNTER — Ambulatory Visit: Payer: No Typology Code available for payment source | Admitting: Family Medicine

## 2021-09-20 ENCOUNTER — Other Ambulatory Visit: Payer: Self-pay

## 2021-09-20 ENCOUNTER — Encounter: Payer: Self-pay | Admitting: Family Medicine

## 2021-09-20 ENCOUNTER — Ambulatory Visit: Payer: Self-pay | Attending: Family Medicine | Admitting: Family Medicine

## 2021-09-20 VITALS — BP 149/80 | HR 80 | Ht 62.0 in | Wt 156.4 lb

## 2021-09-20 DIAGNOSIS — G8929 Other chronic pain: Secondary | ICD-10-CM

## 2021-09-20 DIAGNOSIS — E1169 Type 2 diabetes mellitus with other specified complication: Secondary | ICD-10-CM

## 2021-09-20 DIAGNOSIS — H44113 Panuveitis, bilateral: Secondary | ICD-10-CM

## 2021-09-20 DIAGNOSIS — E1165 Type 2 diabetes mellitus with hyperglycemia: Secondary | ICD-10-CM

## 2021-09-20 DIAGNOSIS — R030 Elevated blood-pressure reading, without diagnosis of hypertension: Secondary | ICD-10-CM

## 2021-09-20 DIAGNOSIS — E785 Hyperlipidemia, unspecified: Secondary | ICD-10-CM

## 2021-09-20 DIAGNOSIS — M25512 Pain in left shoulder: Secondary | ICD-10-CM

## 2021-09-20 DIAGNOSIS — M25511 Pain in right shoulder: Secondary | ICD-10-CM

## 2021-09-20 LAB — GLUCOSE, POCT (MANUAL RESULT ENTRY): POC Glucose: 81 mg/dl (ref 70–99)

## 2021-09-20 LAB — POCT GLYCOSYLATED HEMOGLOBIN (HGB A1C): HbA1c, POC (controlled diabetic range): 7.7 % — AB (ref 0.0–7.0)

## 2021-09-20 MED ORDER — BASAGLAR KWIKPEN 100 UNIT/ML ~~LOC~~ SOPN
32.0000 [IU] | PEN_INJECTOR | Freq: Every day | SUBCUTANEOUS | 6 refills | Status: DC
  Filled 2021-09-20: qty 30, 93d supply, fill #0
  Filled 2021-10-12: qty 9, 28d supply, fill #0
  Filled 2021-11-08: qty 9, 28d supply, fill #1
  Filled 2021-12-12: qty 9, 28d supply, fill #2
  Filled 2022-01-16: qty 9, 28d supply, fill #3

## 2021-09-20 MED ORDER — MELOXICAM 7.5 MG PO TABS
7.5000 mg | ORAL_TABLET | Freq: Every day | ORAL | 1 refills | Status: DC | PRN
  Filled 2021-09-20: qty 30, 30d supply, fill #0
  Filled 2022-01-16: qty 30, 30d supply, fill #1

## 2021-09-20 NOTE — Progress Notes (Signed)
Subjective:  Patient ID: Margaret Austin, female    DOB: 1956-02-17  Age: 66 y.o. MRN: [id number]  CC: Diabetes   HPI Margaret Austin is a 66 y.o. year old female with a history of Diabetes Mellitus type 2 (A1c 7.7), Hyperlipidemia, panuveitis of both eyes, left eye retinal detachment.  Interval History: She is fasting in anticipation of labs  Bloods have been 130, 140 fasting and random 160 A1c is 7.7 down from 10.5.  She denies presence of hypoglycemia, numbness in extremities but does have abnormal vision. She has been administering 30 rather than 35 units of Lantus which appears on her med list  Complains of bilateral shoulder pain worse at night and mild during the day.  Pain is also worse when she reaches out overhead.  She denies history of trauma.  Complains of losing her vision in both eyes.  Last seen by ophthalmology Dr. Manuella Ghazi 1 week ago and is currently on azathioprine. Past Medical History:  Diagnosis Date   Diabetes mellitus without complication ([place])     History reviewed. No pertinent surgical history.  Family History  Problem Relation Age of Onset   Autoimmune disease Neg Hx     No Known Allergies  Outpatient Medications Prior to Visit  Medication Sig Dispense Refill   atorvastatin (LIPITOR) 80 MG tablet TAKE 1 TABLET (80 MG TOTAL) BY MOUTH DAILY. (Patient taking differently: Take 80 mg by mouth at bedtime.) 30 tablet 6   Blood Glucose Monitoring Suppl (TRUE METRIX METER) w/Device KIT Used as directed, 3 times daily. (Patient taking differently: Check blood sugar 1-2 times daily) 1 kit 0   fluticasone (FLONASE) 50 MCG/ACT nasal spray Place 2 sprays into both nostrils daily. 16 g 5   glipiZIDE (GLUCOTROL) 10 MG tablet TAKE 1 TABLET (10 MG TOTAL) BY MOUTH 2 (TWO) TIMES DAILY BEFORE A MEAL. (Patient taking differently: Take 10 mg by mouth 2 (two) times daily.) 60 tablet 6   glucose blood test strip USE AS INSTRUCTED, 3 TIMES DAILY (Patient taking  differently: Check blood sugar 1-2 times daily) 100 strip 12   hydrocortisone 2.5 % cream Apply topically 2 (two) times daily. 30 g 0   Insulin Pen Needle 31G X 8 MM MISC USE AS DIRECTED 100 each 2   liraglutide (VICTOZA) 18 MG/3ML SOPN inject 1.8 MG into the skin ONCE DAILY 9 mL 1   metFORMIN (GLUCOPHAGE) 1000 MG tablet TAKE 1 TABLET (1,000 MG TOTAL) BY MOUTH 2 (TWO) TIMES DAILY WITH A MEAL. 60 tablet 1   OVER THE COUNTER MEDICATION Take 1 tablet by mouth daily. Vitamin from spanish store     tiZANidine (ZANAFLEX) 4 MG tablet TAKE 1 TABLET (4 MG TOTAL) BY MOUTH EVERY 8 (EIGHT) HOURS AS NEEDED FOR MUSCLE SPASMS. (Patient taking differently: Take 4 mg by mouth every 8 (eight) hours as needed for muscle spasms.) 60 tablet 2   TRUEplus Lancets 28G MISC USAR A CHEQUEAR LA SANGRE 3 VECES AL DIA (Patient taking differently: Check blood sugar 1-2 times daily) 100 each 12   insulin glargine (LANTUS) 100 UNIT/ML Solostar Pen INJECT 35 UNITS INTO THE SKIN DAILY AT 10 PM. (Patient taking differently: Inject 35 Units into the skin at bedtime.) 30 mL 6   meloxicam (MOBIC) 7.5 MG tablet TAKE 1 TABLET (7.5 MG TOTAL) BY MOUTH DAILY. (Patient taking differently: Take 7.5 mg by mouth daily as needed for pain.) 30 tablet 2   No facility-administered medications prior to visit.  ROS Review of Systems  Constitutional:  Negative for activity change, appetite change and fatigue.  HENT:  Negative for congestion, sinus pressure and sore throat.   Eyes:  Positive for visual disturbance.  Respiratory:  Negative for cough, chest tightness, shortness of breath and wheezing.   Cardiovascular:  Negative for chest pain and palpitations.  Gastrointestinal:  Negative for abdominal distention, abdominal pain and constipation.  Endocrine: Negative for polydipsia.  Genitourinary:  Negative for dysuria and frequency.  Musculoskeletal:  Negative for arthralgias and back pain.  Skin:  Negative for rash.  Neurological:   Negative for tremors, light-headedness and numbness.  Hematological:  Does not bruise/bleed easily.  Psychiatric/Behavioral:  Negative for agitation and behavioral problems.    Objective:  BP (!) 149/80    Pulse 80    Ht _0  (1.575 m)    Wt 156 lb 6.4 oz (70.9 kg)    SpO2 98%    BMI 28.61 kg/m   BP/Weight 09/20/2021 06/01/2021 0/25/4270  Systolic BP 623 762 831  Diastolic BP 80 63 67  Wt. (Lbs) 156.4 - 158.2  BMI 28.61 - 28.94      Physical Exam Constitutional:      Appearance: She is well-developed.  Cardiovascular:     Rate and Rhythm: Normal rate.     Heart sounds: Normal heart sounds. No murmur heard. Pulmonary:     Effort: Pulmonary effort is normal.     Breath sounds: Normal breath sounds. No wheezing or rales.  Chest:     Chest wall: No tenderness.  Abdominal:     General: Bowel sounds are normal. There is no distension.     Palpations: Abdomen is soft. There is no mass.     Tenderness: There is no abdominal tenderness.  Musculoskeletal:        General: Normal range of motion.     Right lower leg: No edema.     Left lower leg: No edema.  Neurological:     Mental Status: She is alert and oriented to person, place, and time.  Psychiatric:        Mood and Affect: Mood normal.    CMP Latest Ref Rng & Units 06/01/2021 05/31/2021 04/10/2021  Glucose 70 - 99 mg/dL 108(H) 141(H) 80  BUN 8 - 23 mg/dL _1 Creatinine 0.44 - 1.00 mg/dL 0.48 0.65 0.57  Sodium 135 - 145 mmol/L 139 138 139  Potassium 3.5 - 5.1 mmol/L 3.6 3.8 4.7  Chloride 98 - 111 mmol/L 106 103 102  CO2 22 - 32 mmol/L _2 Calcium 8.9 - 10.3 mg/dL 8.8(L) 8.9 9.2  Total Protein 6.0 - 8.5 g/dL - - 7.4  Total Bilirubin 0.0 - 1.2 mg/dL - - 0.5  Alkaline Phos 44 - 121 IU/L - - 128(H)  AST 0 - 40 IU/L - - 22  ALT 0 - 32 IU/L - - 18    Lipid Panel     Component Value Date/Time   CHOL 162 10/03/2020 1639   TRIG 108 10/03/2020 1639   HDL 50 10/03/2020 1639   CHOLHDL 3.2 10/03/2020 1639    CHOLHDL 5.2 (H) 02/06/2016 1557   VLDL 42 (H) 02/06/2016 1557   LDLCALC 92 10/03/2020 1639    CBC    Component Value Date/Time   WBC 10.3 06/01/2021 0213   RBC 4.33 06/01/2021 0213   HGB 13.2 06/01/2021 0213   HCT 39.9 06/01/2021 0213   PLT 278 06/01/2021 0213   MCV 92.1  06/01/2021 0213   MCH 30.5 06/01/2021 0213   MCHC 33.1 06/01/2021 0213   RDW 13.1 06/01/2021 0213   LYMPHSABS 3.2 05/31/2021 1735   MONOABS 0.5 05/31/2021 1735   EOSABS 0.3 05/31/2021 1735   BASOSABS 0.0 05/31/2021 1735    Lab Results  Component Value Date   HGBA1C 7.7 (A) 09/20/2021    Assessment & Plan:  1. Type 2 diabetes mellitus with hyperglycemia, without long-term current use of insulin (HCC) Improved significantly with A1c of 7.7; goal is less than 7.0 Increase Basaglar from 30 units to 32 units She has been commended on improvement Counseled on Diabetic diet, my plate method, 432 minutes of moderate intensity exercise/week Blood sugar logs with fasting goals of 80-120 mg/dl, random of less than 180 and in the event of sugars less than 60 mg/dl or greater than 400 mg/dl encouraged to notify the clinic. Advised on the need for annual eye exams, annual foot exams, Pneumonia vaccine. - Microalbumin / creatinine urine ratio - LP+Non-HDL Cholesterol - CMP14+EGFR - Fecal occult blood, imunochemical([facility]) - POCT glucose (manual entry) - POCT glycosylated hemoglobin (Hb A1C) - Insulin Glargine (BASAGLAR KWIKPEN) 100 UNIT/ML; Inject 32 Units into the skin at bedtime.  Dispense: 30 mL; Refill: 6  2. Panuveitis of both eyes Vision continues to worsen Currently on azathioprine Encouraged to keep appointments with ophthalmology Margaret Austin  3. Chronic pain of both shoulders Uncontrolled Placed on meloxicam If symptoms persist consider referral for PT - meloxicam (MOBIC) 7.5 MG tablet; Take 1 tablet (7.5 mg total) by mouth daily as needed for pain.  Dispense: 30 tablet; Refill: 1  4.  Hyperlipidemia associated with type 2 diabetes mellitus ([place]) Controlled  5. Elevated blood pressure reading without diagnosis of hypertension She has no history of hypertension and blood pressure at last visit was 129/63 Advised to work on lifestyle modification and I will recheck at next visit   Meds ordered this encounter  Medications   Insulin Glargine (BASAGLAR KWIKPEN) 100 UNIT/ML    Sig: Inject 32 Units into the skin at bedtime.    Dispense:  30 mL    Refill:  6   meloxicam (MOBIC) 7.5 MG tablet    Sig: Take 1 tablet (7.5 mg total) by mouth daily as needed for pain.    Dispense:  30 tablet    Refill:  1    Follow-up: Return in about 3 months (around 12/18/2021) for Chronic medical conditions.       Charlott Rakes, MD, FAAFP. [facility] [place], [place]   09/20/2021, 4:25 PM

## 2021-09-20 NOTE — Progress Notes (Signed)
Pain in both arms. Pt is fasting. [id number]

## 2021-09-20 NOTE — Patient Instructions (Signed)
Diabetes mellitus y [place] fsica Diabetes Mellitus and Exercise Hacer actividad fsica habitualmente es importante para el estado de salud general, en especial para las personas que tienen diabetes mellitus. La actividad fsica no solo se reduce a Pharmacist, hospital. Aporta muchos beneficios para la salud, como aumento de la fuerza muscular y la densidad sea, y reduccin de las grasas corporales y Dealer. Esto mejora el estado fsico, la flexibilidad y la resistencia, y todo ello redunda en un mejor estado de salud general. Cules son los beneficios de la actividad fsica si tengo diabetes? La actividad fsica tiene muchos beneficios para las personas con diabetes. Incluyen los siguientes: Ayuda a Sports coach y Engineer, agricultural en la sangre (glucosa) bajo control. Mejora la respuesta del cuerpo a la hormona insulina porque optimiza la sensibilidad a la insulina. Reduce la cantidad de insulina que el cuerpo necesita. Reduce el riesgo de tener una enfermedad cardaca porque: [place] los niveles de colesterol malo y triglicridos. Aumenta los niveles de colesterol bueno. [place] la presin arterial. Disminuye la glucemia. Cul es mi plan de Kandace Blitz? El mdico o un educador para la diabetes certificado pueden ayudarlo a Engineer, petroleum del tipo y de la frecuencia de actividad fsica adecuado para usted. Esto se denomina plan de [place]. Asegrese de lo siguiente: Haga por lo menos 150 minutos semanales de ejercicios de intensidad media o alta. Los ejercicios pueden incluir caminar a paso rpido, andar en bicicleta o hacer gimnasia aerbica en el agua. Haga ejercicios de elongacin y de fortalecimiento, como yoga o levantamiento de pesas, por lo menos 2 veces por semana. Reparta la [employer] en al menos 3 das de la [place]. Haga algn tipo de actividad fsica Armed forces operational officer. No deje pasar ms de 2 das seguidos sin hacer algn tipo de actividad fsica. No permanezca inactivo durante ms de 90  minutos seguidos. Tmese descansos frecuentes para caminar o estirarse. Elija ejercicios o actividades que disfrute. Establezca objetivos realistas. Comience lentamente y aumente de [place] gradual la intensidad de la actividad fsica con el correr del [place]. Cmo controlo la diabetes durante la actividad fsica? Controlar su nivel de glucemia Contrlese la glucemia antes y despus de ejercitarse. Si el nivel de glucemia es: 240 mg/dl (13.3 mmol/l) o ms antes de comenzar a [employer] fsica, controle la orina para detectar la presencia de cetonas. Estas son sustancias qumicas producidas por el hgado. Si tiene [employer] orina, no haga ejercicio hasta que la glucemia se normalice. 100 mg/dl (5.6 mmol/l) o menos, tome una colacin que [employer] 15 y 25 gramos de carbohidratos. Controle la glucemia 15 minutos despus de la colacin para asegurarse de que el nivel de glucosa est por encima de 100 mg/dl (5.6 mmol/l) antes de comenzar a hacer actividad fsica. Conozca los sntomas de la glucemia baja (hipoglucemia) y aprenda cmo tratarla. El riesgo de tener hipoglucemia [place] durante y despus de hacer actividad fsica. Siga estos consejos y las instrucciones del mdico Tenga una colacin de carbohidratos que sea de accin rpida antes, durante y despus de ejercitarse, a fin de evitar o tratar la hipoglucemia. Evite inyectarse insulina en las zonas del cuerpo que ejercitar. Por ejemplo, evite inyectarse insulina en: [place] brazos, cuando est por jugar al tenis. Las piernas, cuando est por irse a trotar. Lleve registros de sus hbitos de actividad fsica. Esto puede ayudarlos a usted y al mdico a Tax adviser de control de la diabetes segn sea necesario. [employer] los siguientes datos: Los [employer]  consume antes y despus de hacer actividad fsica. Los niveles de glucemia antes y despus de hacer ejercicios. El tipo y cantidad de [place] fsica que Musician. Trabaje con el  mdico cuando comience un nuevo tipo de actividad fsica o ejercicio. Es posible que el mdico deba hacer lo siguiente: Asegurarse de que la actividad sea segura para usted. Ajustar la insulina, los otros medicamentos y los alimentos que usted consume. Beba mucha agua mientras hace ejercicio. Esto previene la prdida de agua (deshidratacin) y los problemas causados por mucho calor en el cuerpo (golpe de calor). Dnde buscar ms informacin American Diabetes Association (Asociacin Estadounidense de la Diabetes): www.diabetes.org Resumen Hacer actividad fsica habitualmente es importante para el estado de salud general, en especial para las personas que tienen diabetes mellitus. Hacer actividad fsica tiene muchos beneficios para [employer]	[place]. [place]ument[place] la fuerza muscular y la densidad sea, y reduce las grasas corporales y Dealer. Tambin disminuye y controla la glucemia. El mdico o un educador para la diabetes certificado puede ayudarlo a Paediatric nurse un plan de actividades respecto del tipo y de la frecuencia de actividad fsica adecuados para usted. Consulte al mdico para asegurarse de que cualquier actividad nueva sea segura para usted. Tambin trabaje con el mdico para ajustar la [place], los otros medicamentos y los alimentos que consume. Esta informacin no tiene Marine scientist el consejo del mdico. Asegrese de hacerle al mdico cualquier pregunta que tenga. Document Revised: 08/07/2019 Document Reviewed: 08/07/2019 Elsevier Patient Education  2022 [employer].

## 2021-09-21 LAB — CMP14+EGFR
ALT: 21 IU/L (ref 0–32)
AST: 21 IU/L (ref 0–40)
Albumin/Globulin Ratio: 1.6 (ref 1.2–2.2)
Albumin: 4.5 g/dL (ref 3.8–4.8)
Alkaline Phosphatase: 120 IU/L (ref 44–121)
BUN/Creatinine Ratio: 15 (ref 12–28)
BUN: 8 mg/dL (ref 8–27)
Bilirubin Total: 1.1 mg/dL (ref 0.0–1.2)
CO2: 25 mmol/L (ref 20–29)
Calcium: 9.3 mg/dL (ref 8.7–10.3)
Chloride: 104 mmol/L (ref 96–106)
Creatinine, Ser: 0.55 mg/dL — ABNORMAL LOW (ref 0.57–1.00)
Globulin, Total: 2.9 g/dL (ref 1.5–4.5)
Glucose: 74 mg/dL (ref 70–99)
Potassium: 4.3 mmol/L (ref 3.5–5.2)
Sodium: 144 mmol/L (ref 134–144)
Total Protein: 7.4 g/dL (ref 6.0–8.5)
eGFR: 103 mL/min/{1.73_m2} (ref >59)

## 2021-09-21 LAB — MICROALBUMIN / CREATININE URINE RATIO
Creatinine, Urine: 133 mg/dL
Microalb/Creat Ratio: 4 mg/g creat (ref 0–29)
Microalbumin, Urine: 5.6 ug/mL

## 2021-09-21 LAB — LP+NON-HDL CHOLESTEROL
Cholesterol, Total: 157 mg/dL (ref 100–199)
HDL: 42 mg/dL (ref >39)
LDL Chol Calc (NIH): 93 mg/dL (ref 0–99)
Total Non-HDL-Chol (LDL+VLDL): 115 mg/dL (ref 0–129)
Triglycerides: 121 mg/dL (ref 0–149)
VLDL Cholesterol Cal: 22 mg/dL (ref 5–40)

## 2021-10-02 ENCOUNTER — Other Ambulatory Visit: Payer: Self-pay

## 2021-10-12 ENCOUNTER — Other Ambulatory Visit: Payer: Self-pay

## 2021-10-12 ENCOUNTER — Other Ambulatory Visit: Payer: Self-pay | Admitting: Family Medicine

## 2021-10-12 DIAGNOSIS — E1169 Type 2 diabetes mellitus with other specified complication: Secondary | ICD-10-CM

## 2021-10-12 MED ORDER — ATORVASTATIN CALCIUM 80 MG PO TABS
ORAL_TABLET | Freq: Every day | ORAL | 6 refills | Status: DC
  Filled 2021-10-12: qty 30, 30d supply, fill #0
  Filled 2021-11-08: qty 30, 30d supply, fill #1
  Filled 2021-12-12: qty 30, 30d supply, fill #2
  Filled 2022-01-16: qty 30, 30d supply, fill #3

## 2021-11-08 ENCOUNTER — Other Ambulatory Visit: Payer: Self-pay

## 2021-11-08 ENCOUNTER — Other Ambulatory Visit: Payer: Self-pay | Admitting: Family Medicine

## 2021-11-08 DIAGNOSIS — E1165 Type 2 diabetes mellitus with hyperglycemia: Secondary | ICD-10-CM

## 2021-11-08 MED ORDER — METFORMIN HCL 1000 MG PO TABS
ORAL_TABLET | Freq: Two times a day (BID) | ORAL | 1 refills | Status: DC
  Filled 2021-11-08: qty 60, 30d supply, fill #0
  Filled 2021-12-12: qty 60, 30d supply, fill #1

## 2021-12-12 ENCOUNTER — Other Ambulatory Visit: Payer: Self-pay

## 2021-12-19 ENCOUNTER — Ambulatory Visit: Payer: Self-pay | Attending: Family Medicine | Admitting: Family Medicine

## 2021-12-19 ENCOUNTER — Other Ambulatory Visit: Payer: Self-pay

## 2021-12-19 ENCOUNTER — Encounter: Payer: Self-pay | Admitting: Family Medicine

## 2021-12-19 VITALS — BP 137/77 | HR 81 | Ht 62.0 in | Wt 156.0 lb

## 2021-12-19 DIAGNOSIS — H44113 Panuveitis, bilateral: Secondary | ICD-10-CM

## 2021-12-19 DIAGNOSIS — Z1211 Encounter for screening for malignant neoplasm of colon: Secondary | ICD-10-CM

## 2021-12-19 DIAGNOSIS — E785 Hyperlipidemia, unspecified: Secondary | ICD-10-CM

## 2021-12-19 DIAGNOSIS — E1165 Type 2 diabetes mellitus with hyperglycemia: Secondary | ICD-10-CM

## 2021-12-19 DIAGNOSIS — E1169 Type 2 diabetes mellitus with other specified complication: Secondary | ICD-10-CM

## 2021-12-19 DIAGNOSIS — Z1231 Encounter for screening mammogram for malignant neoplasm of breast: Secondary | ICD-10-CM

## 2021-12-19 LAB — POCT GLYCOSYLATED HEMOGLOBIN (HGB A1C): HbA1c, POC (controlled diabetic range): 6.3 % (ref 0.0–7.0)

## 2021-12-19 LAB — GLUCOSE, POCT (MANUAL RESULT ENTRY): POC Glucose: 97 mg/dl (ref 70–99)

## 2021-12-19 MED ORDER — METFORMIN HCL 1000 MG PO TABS
ORAL_TABLET | Freq: Two times a day (BID) | ORAL | 1 refills | Status: DC
  Filled 2021-12-19: qty 180, fill #0
  Filled 2022-01-16: qty 180, 90d supply, fill #0

## 2021-12-19 NOTE — Progress Notes (Signed)
? ?Subjective:  ?Patient ID: Margaret Austin, female    DOB: [birth year]  Age: 66 y.o. MRN: [id number] ? ?CC: Diabetes ? ? ?HPI ?Margaret Austin is a 66 y.o. year old female with a history of Diabetes Mellitus type 2 (A1c 6.3), Hyperlipidemia, panuveitis of both eyes, left eye retinal detachment. ? ?Interval History: ?Seen by ophthalmology, [facility] on 11/06/2021 and notes reviewed.  She was placed on clinical trial.  She is currently on azathioprine, prednisone drops. ?She has not noticed much improvement in her vision but states ophthalmology had informed her progress would be slow. ? ?Denies hypoglcemia and is doing well on diabetes regimen.  She has no neuropathy symptoms.  Compliant on her statin. ?Past Medical History:  ?Diagnosis Date  ? Diabetes mellitus without complication ([place])   ? ? ?No past surgical history on file. ? ?Family History  ?Problem Relation Age of Onset  ? Autoimmune disease Neg Hx   ? ? ?Social History  ? ?Socioeconomic History  ? Marital status: Single  ?  Spouse name: Not on file  ? Number of children: Not on file  ? Years of education: Not on file  ? Highest education level: Not on file  ?Occupational History  ? Not on file  ?Tobacco Use  ? Smoking status: Never  ? Smokeless tobacco: Never  ?Vaping Use  ? Vaping Use: Never used  ?Substance and Sexual Activity  ? Alcohol use: No  ? Drug use: No  ? Sexual activity: Yes  ?  Birth control/protection: None  ?Other Topics Concern  ? Not on file  ?Social History Narrative  ? ** Merged History Encounter **  ?    ? ?Social Determinants of Health  ? ?Financial Resource Strain: Not on file  ?Food Insecurity: Not on file  ?Transportation Needs: Not on file  ?Physical Activity: Not on file  ?Stress: Not on file  ?Social Connections: Not on file  ? ? ?No Known Allergies ? ?Outpatient Medications Prior to Visit  ?Medication Sig Dispense Refill  ? atorvastatin (LIPITOR) 80 MG tablet TAKE 1 TABLET (80 MG TOTAL) BY MOUTH DAILY. 30 tablet  6  ? Blood Glucose Monitoring Suppl (TRUE METRIX METER) w/Device KIT Used as directed, 3 times daily. (Patient taking differently: Check blood sugar 1-2 times daily) 1 kit 0  ? fluticasone (FLONASE) 50 MCG/ACT nasal spray Place 2 sprays into both nostrils daily. 16 g 5  ? hydrocortisone 2.5 % cream Apply topically 2 (two) times daily. 30 g 0  ? Insulin Glargine (BASAGLAR KWIKPEN) 100 UNIT/ML Inject 32 Units into the skin at bedtime. 30 mL 6  ? liraglutide (VICTOZA) 18 MG/3ML SOPN inject 1.8 MG into the skin ONCE DAILY 9 mL 1  ? meloxicam (MOBIC) 7.5 MG tablet Take 1 tablet (7.5 mg total) by mouth daily as needed for pain. 30 tablet 1  ? metFORMIN (GLUCOPHAGE) 1000 MG tablet TAKE 1 TABLET (1,000 MG TOTAL) BY MOUTH 2 (TWO) TIMES DAILY WITH A MEAL. 60 tablet 1  ? OVER THE COUNTER MEDICATION Take 1 tablet by mouth daily. Vitamin from [facility] [facility]    ? glipiZIDE (GLUCOTROL) 10 MG tablet TAKE 1 TABLET (10 MG TOTAL) BY MOUTH 2 (TWO) TIMES DAILY BEFORE A MEAL. (Patient taking differently: Take 10 mg by mouth 2 (two) times daily.) 60 tablet 6  ? ?No facility-administered medications prior to visit.  ? ? ? ?ROS ?Review of Systems  ?Constitutional:  Negative for activity change, appetite change and fatigue.  ?HENT:  Negative for congestion, sinus pressure and sore throat.   ?Eyes:  Positive for visual disturbance.  ?Respiratory:  Negative for cough, chest tightness, shortness of breath and wheezing.   ?Cardiovascular:  Negative for chest pain and palpitations.  ?Gastrointestinal:  Negative for abdominal distention, abdominal pain and constipation.  ?Endocrine: Negative for polydipsia.  ?Genitourinary:  Negative for dysuria and frequency.  ?Musculoskeletal:  Negative for arthralgias and back pain.  ?Skin:  Negative for rash.  ?Neurological:  Negative for tremors, light-headedness and numbness.  ?Hematological:  Does not bruise/bleed easily.  ?Psychiatric/Behavioral:  Negative for agitation and behavioral problems.    ? ?Objective:  ?BP 137/77   Pulse 81   Ht _0  (1.575 m)   Wt 156 lb (70.8 kg)   SpO2 98%   BMI 28.53 kg/m?  ? ? ?  12/19/2021  ?  3:30 PM 09/20/2021  ?  2:33 PM 06/01/2021  ?  8:53 AM  ?BP/Weight  ?Systolic BP 151 761 607  ?Diastolic BP 77 80 63  ?Wt. (Lbs) 156 156.4   ?BMI 28.53 kg/m2 28.61 kg/m2   ? ? ? ? ?Physical Exam ?Constitutional:   ?   Appearance: She is well-developed.  ?Cardiovascular:  ?   Rate and Rhythm: Normal rate.  ?   Heart sounds: Normal heart sounds. No murmur heard. ?Pulmonary:  ?   Effort: Pulmonary effort is normal.  ?   Breath sounds: Normal breath sounds. No wheezing or rales.  ?Chest:  ?   Chest wall: No tenderness.  ?Abdominal:  ?   General: Bowel sounds are normal. There is no distension.  ?   Palpations: Abdomen is soft. There is no mass.  ?   Tenderness: There is no abdominal tenderness.  ?Musculoskeletal:     ?   General: Normal range of motion.  ?   Right lower leg: No edema.  ?   Left lower leg: No edema.  ?Neurological:  ?   Mental Status: She is alert and oriented to person, place, and time.  ?Psychiatric:     ?   Mood and Affect: Mood normal.  ? ? ? ?  Latest Ref Rng & Units 09/20/2021  ?  3:10 PM 06/01/2021  ?  2:13 AM 05/31/2021  ?  5:35 PM  ?CMP  ?Glucose 70 - 99 mg/dL 74   108   141    ?BUN 8 - 27 mg/dL _1 ?Creatinine 0.57 - 1.00 mg/dL 0.55   0.48   0.65    ?Sodium 134 - 144 mmol/L 144   139   138    ?Potassium 3.5 - 5.2 mmol/L 4.3   3.6   3.8    ?Chloride 96 - 106 mmol/L 104   106   103    ?CO2 20 - 29 mmol/L _2 ?Calcium 8.7 - 10.3 mg/dL 9.3   8.8   8.9    ?Total Protein 6.0 - 8.5 g/dL 7.4      ?Total Bilirubin 0.0 - 1.2 mg/dL 1.1      ?Alkaline Phos 44 - 121 IU/L 120      ?AST 0 - 40 IU/L 21      ?ALT 0 - 32 IU/L 21      ? ? ?Lipid Panel  ?   ?Component Value Date/Time  ? CHOL 157 09/20/2021 1510  ? TRIG 121 09/20/2021 1510  ? HDL 42 09/20/2021  1510  ? CHOLHDL 3.2 10/03/2020 1639  ? CHOLHDL 5.2 (H) 02/06/2016 1557  ? VLDL 42 (H) 02/06/2016 1557  ?  [place] 93 09/20/2021 1510  ? ? ?CBC ?   ?Component Value Date/Time  ? WBC 10.3 06/01/2021 0213  ? RBC 4.33 06/01/2021 0213  ? HGB 13.2 06/01/2021 0213  ? HCT 39.9 06/01/2021 0213  ? PLT 278 06/01/2021 0213  ? MCV 92.1 06/01/2021 0213  ? MCH 30.5 06/01/2021 0213  ? MCHC 33.1 06/01/2021 0213  ? RDW 13.1 06/01/2021 0213  ? LYMPHSABS 3.2 05/31/2021 1735  ? MONOABS 0.5 05/31/2021 1735  ? EOSABS 0.3 05/31/2021 1735  ? BASOSABS 0.0 05/31/2021 1735  ? ? ?Lab Results  ?Component Value Date  ? HGBA1C 6.3 12/19/2021  ? ? ?Assessment & Plan:  ?1. Type 2 diabetes mellitus with hyperglycemia, without long-term current use of insulin (HCC) ?Controlled with A1c of 6.3 ?Continue current regimen ?- POCT glucose (manual entry) ?- POCT glycosylated hemoglobin (Hb A1C) ?- metFORMIN (GLUCOPHAGE) 1000 MG tablet; TAKE 1 TABLET (1,000 MG TOTAL) BY MOUTH 2 (TWO) TIMES DAILY WITH A MEAL.  Dispense: 180 tablet; Refill: 1 ?- CMP14+EGFR ?- LP+Non-HDL Cholesterol ? ?2. Screening for colon cancer ?- Fecal occult blood, imunochemical([facility]) ? ?3. Encounter for screening mammogram for malignant neoplasm of breast ?- MM DIGITAL SCREENING BILATERAL; Future ? ?4. Panuveitis of both eyes ?With abnormal vision ?Currently on azathioprine, prednisone drops ?Followed by [facility] ophthalmology ? ?5. Hyperlipidemia associated with type 2 diabetes mellitus ([place]) ?Controlled ?Continue statin and low-cholesterol diet ? ? ? ?No orders of the defined types were placed in this encounter. ? ? ?Return in about 6 months (around 06/21/2022) for Chronic medical conditions. ? ? ? ? ? ? ?Charlott Rakes, MD, FAAFP. ?[place] ?[place], [place] ?[phone]   ?12/19/2021, 3:59 PM ?

## 2021-12-20 ENCOUNTER — Telehealth: Payer: Self-pay

## 2021-12-20 ENCOUNTER — Encounter: Payer: Self-pay | Admitting: Family Medicine

## 2021-12-20 LAB — LP+NON-HDL CHOLESTEROL
Cholesterol, Total: 144 mg/dL (ref 100–199)
HDL: 52 mg/dL (ref >39)
LDL Chol Calc (NIH): 77 mg/dL (ref 0–99)
Total Non-HDL-Chol (LDL+VLDL): 92 mg/dL (ref 0–129)
Triglycerides: 80 mg/dL (ref 0–149)
VLDL Cholesterol Cal: 15 mg/dL (ref 5–40)

## 2021-12-20 LAB — CMP14+EGFR
ALT: 14 IU/L (ref 0–32)
AST: 20 IU/L (ref 0–40)
Albumin/Globulin Ratio: 1.7 (ref 1.2–2.2)
Albumin: 4.4 g/dL (ref 3.8–4.8)
Alkaline Phosphatase: 103 IU/L (ref 44–121)
BUN/Creatinine Ratio: 13 (ref 12–28)
BUN: 7 mg/dL — ABNORMAL LOW (ref 8–27)
Bilirubin Total: 1 mg/dL (ref 0.0–1.2)
CO2: 25 mmol/L (ref 20–29)
Calcium: 9.7 mg/dL (ref 8.7–10.3)
Chloride: 104 mmol/L (ref 96–106)
Creatinine, Ser: 0.56 mg/dL — ABNORMAL LOW (ref 0.57–1.00)
Globulin, Total: 2.6 g/dL (ref 1.5–4.5)
Glucose: 80 mg/dL (ref 70–99)
Potassium: 4.5 mmol/L (ref 3.5–5.2)
Sodium: 142 mmol/L (ref 134–144)
Total Protein: 7 g/dL (ref 6.0–8.5)
eGFR: 102 mL/min/{1.73_m2} (ref >59)

## 2021-12-20 NOTE — Telephone Encounter (Signed)
[employer]  perla Id#  [phone] contacted pt to go over lab results  pt is aware and doesn't have any questions or concerns  ?

## 2022-01-16 ENCOUNTER — Other Ambulatory Visit: Payer: Self-pay

## 2022-03-21 ENCOUNTER — Other Ambulatory Visit: Payer: Self-pay | Admitting: Family Medicine

## 2022-03-21 DIAGNOSIS — G8929 Other chronic pain: Secondary | ICD-10-CM

## 2022-03-21 DIAGNOSIS — E1165 Type 2 diabetes mellitus with hyperglycemia: Secondary | ICD-10-CM

## 2022-03-21 NOTE — Telephone Encounter (Signed)
Medication Refill - Medication: metFORMIN (GLUCOPHAGE) 1000 MG tablet, Insulin Glargine (BASAGLAR KWIKPEN) 100 UNIT/ML,meloxicam (MOBIC) 7.5 MG tablet  Has the patient contacted their pharmacy? No. Pt stated pharmacy doesn't answer.   (Agent: If no, request that the patient contact the pharmacy for the refill. If patient does not wish to contact the pharmacy document the reason why and proceed with request.)   Preferred Pharmacy (with phone number or street name):  [facility] [place]. [address] [place] [address]  Phone: [phone] Fax: [phone]  Hours: M-F 7:30a-6:00p   Has the patient been seen for an appointment in the last year OR does the patient have an upcoming appointment? Yes.    Agent: Please be advised that RX refills may take up to 3 business days. We ask that you follow-up with your pharmacy.

## 2022-03-22 ENCOUNTER — Other Ambulatory Visit: Payer: Self-pay

## 2022-03-22 ENCOUNTER — Other Ambulatory Visit: Payer: Self-pay | Admitting: Pharmacist

## 2022-03-22 DIAGNOSIS — E1165 Type 2 diabetes mellitus with hyperglycemia: Secondary | ICD-10-CM

## 2022-03-22 MED ORDER — METFORMIN HCL 1000 MG PO TABS
ORAL_TABLET | Freq: Two times a day (BID) | ORAL | 0 refills | Status: DC
  Filled 2022-03-22: qty 180, fill #0
  Filled 2022-04-24: qty 180, 90d supply, fill #0

## 2022-03-22 MED ORDER — BASAGLAR KWIKPEN 100 UNIT/ML ~~LOC~~ SOPN
32.0000 [IU] | PEN_INJECTOR | Freq: Every day | SUBCUTANEOUS | 3 refills | Status: DC
  Filled 2022-03-22: qty 3, 9d supply, fill #0

## 2022-03-22 MED ORDER — MELOXICAM 7.5 MG PO TABS
7.5000 mg | ORAL_TABLET | Freq: Every day | ORAL | 1 refills | Status: DC | PRN
  Filled 2022-03-22: qty 30, 30d supply, fill #0

## 2022-03-22 MED ORDER — BASAGLAR KWIKPEN 100 UNIT/ML ~~LOC~~ SOPN
32.0000 [IU] | PEN_INJECTOR | Freq: Every day | SUBCUTANEOUS | 2 refills | Status: DC
  Filled 2022-03-22: qty 9, 28d supply, fill #0
  Filled 2022-04-24: qty 9, 28d supply, fill #1

## 2022-03-22 NOTE — Telephone Encounter (Signed)
Requested Prescriptions  Pending Prescriptions Disp Refills  . meloxicam (MOBIC) 7.5 MG tablet 30 tablet 1    Sig: Take 1 tablet (7.5 mg total) by mouth daily as needed for pain.     Analgesics:  COX2 Inhibitors Failed - 03/21/2022  4:44 PM      Failed - Manual Review: Labs are only required if the patient has taken medication for more than 8 weeks.      Failed - Cr in normal range and within 360 days    Creat  Date Value Ref Range Status  02/06/2016 0.48 (L) 0.50 - 1.05 mg/dL Final    Comment:      For patients > or = 64 years of age: The upper reference limit for Creatinine is approximately 13% higher for people identified as African-American.      Creatinine, Ser  Date Value Ref Range Status  12/19/2021 0.56 (L) 0.57 - 1.00 mg/dL Final   Creatinine, Urine  Date Value Ref Range Status  02/06/2016 143 20 - 320 mg/dL Final         Passed - HGB in normal range and within 360 days    Hemoglobin  Date Value Ref Range Status  06/01/2021 13.2 12.0 - 15.0 g/dL Final         Passed - HCT in normal range and within 360 days    HCT  Date Value Ref Range Status  06/01/2021 39.9 36.0 - 46.0 % Final         Passed - AST in normal range and within 360 days    AST  Date Value Ref Range Status  12/19/2021 20 0 - 40 IU/L Final         Passed - ALT in normal range and within 360 days    ALT  Date Value Ref Range Status  12/19/2021 14 0 - 32 IU/L Final         Passed - eGFR is 30 or above and within 360 days    GFR, Est African American  Date Value Ref Range Status  02/06/2016 >89 >=60 mL/min Final   GFR calc Af Amer  Date Value Ref Range Status  10/03/2020 112 >59 mL/min/1.73 Corrected    Comment:    **In accordance with recommendations from the NKF-ASN Task force,**   [facility] is in the process of updating its eGFR calculation to the   2021 CKD-EPI creatinine equation that estimates kidney function   without a race variable.    GFR, Est Non African American  Date  Value Ref Range Status  02/06/2016 >89 >=60 mL/min Final   GFR, Estimated  Date Value Ref Range Status  06/01/2021 >60 >60 mL/min Final    Comment:    (NOTE) Calculated using the CKD-EPI Creatinine Equation (2021)    eGFR  Date Value Ref Range Status  12/19/2021 102 >59 mL/min/1.73 Final         Passed - Patient is not pregnant      Passed - Valid encounter within last 12 months    Recent Outpatient Visits          3 months ago Type 2 diabetes mellitus with hyperglycemia, without long-term current use of insulin ([place])   [place], [place], MD   6 months ago Type 2 diabetes mellitus with hyperglycemia, without long-term current use of insulin ([place])   [place], Charlane Ferretti, MD   11 months ago Type 2 diabetes mellitus  with hyperglycemia, with long-term current use of insulin ([place])   [place], [place], MD   1 year ago Type 2 diabetes mellitus with hyperglycemia, with long-term current use of insulin [facility])   [place], Jarome Matin, RPH-CPP   1 year ago Type 2 diabetes mellitus with hyperglycemia, without long-term current use of insulin ([place])   [place], [place], MD      Future Appointments            In 3 months Charlott Rakes, MD [place]           . metFORMIN (GLUCOPHAGE) 1000 MG tablet 180 tablet 0    Sig: TAKE 1 TABLET (1,000 MG TOTAL) BY MOUTH 2 (TWO) TIMES DAILY WITH A MEAL.     Endocrinology:  Diabetes - Biguanides Failed - 03/21/2022  4:44 PM      Failed - Cr in normal range and within 360 days    Creat  Date Value Ref Range Status  02/06/2016 0.48 (L) 0.50 - 1.05 mg/dL Final    Comment:      For patients > or = 64 years of age: The upper reference limit for Creatinine is approximately 13% higher for people identified  as African-American.      Creatinine, Ser  Date Value Ref Range Status  12/19/2021 0.56 (L) 0.57 - 1.00 mg/dL Final   Creatinine, Urine  Date Value Ref Range Status  02/06/2016 143 20 - 320 mg/dL Final         Failed - B12 Level in normal range and within 720 days    No results found for: "VITAMINB12"       Passed - HBA1C is between 0 and 7.9 and within 180 days    HbA1c, POC (controlled diabetic range)  Date Value Ref Range Status  12/19/2021 6.3 0.0 - 7.0 % Final         Passed - eGFR in normal range and within 360 days    GFR, Est African American  Date Value Ref Range Status  02/06/2016 >89 >=60 mL/min Final   GFR calc Af Amer  Date Value Ref Range Status  10/03/2020 112 >59 mL/min/1.73 Corrected    Comment:    **In accordance with recommendations from the NKF-ASN Task force,**   [facility] is in the process of updating its eGFR calculation to the   2021 CKD-EPI creatinine equation that estimates kidney function   without a race variable.    GFR, Est Non African American  Date Value Ref Range Status  02/06/2016 >89 >=60 mL/min Final   GFR, Estimated  Date Value Ref Range Status  06/01/2021 >60 >60 mL/min Final    Comment:    (NOTE) Calculated using the CKD-EPI Creatinine Equation (2021)    eGFR  Date Value Ref Range Status  12/19/2021 102 >59 mL/min/1.73 Final         Passed - Valid encounter within last 6 months    Recent Outpatient Visits          3 months ago Type 2 diabetes mellitus with hyperglycemia, without long-term current use of insulin ([place])   [place], [place], MD   6 months ago Type 2 diabetes mellitus with hyperglycemia, without long-term current use of insulin ([place])   [place], Charlane Ferretti, MD   11 months ago Type 2  diabetes mellitus with hyperglycemia, with long-term current use of insulin ([place])   [place], Charlane Ferretti, MD   1  year ago Type 2 diabetes mellitus with hyperglycemia, with long-term current use of insulin [facility])   [place], Stephen L, RPH-CPP   1 year ago Type 2 diabetes mellitus with hyperglycemia, without long-term current use of insulin ([place])   [place], Charlane Ferretti, MD      Future Appointments            In 3 months Charlott Rakes, MD [place] - CBC within normal limits and completed in the last 12 months    WBC  Date Value Ref Range Status  06/01/2021 10.3 4.0 - 10.5 K/uL Final   RBC  Date Value Ref Range Status  06/01/2021 4.33 3.87 - 5.11 MIL/uL Final   Hemoglobin  Date Value Ref Range Status  06/01/2021 13.2 12.0 - 15.0 g/dL Final   HCT  Date Value Ref Range Status  06/01/2021 39.9 36.0 - 46.0 % Final   MCHC  Date Value Ref Range Status  06/01/2021 33.1 30.0 - 36.0 g/dL Final   [facility]  Date Value Ref Range Status  06/01/2021 30.5 26.0 - 34.0 pg Final   MCV  Date Value Ref Range Status  06/01/2021 92.1 80.0 - 100.0 fL Final   No results found for: "PLTCOUNTKUC", "LABPLAT", "POCPLA" RDW  Date Value Ref Range Status  06/01/2021 13.1 11.5 - 15.5 % Final         . Insulin Glargine (BASAGLAR KWIKPEN) 100 UNIT/ML 30 mL 6    Sig: Inject 32 Units into the skin at bedtime.     Endocrinology:  Diabetes - Insulins Passed - 03/21/2022  4:44 PM      Passed - HBA1C is between 0 and 7.9 and within 180 days    HbA1c, POC (controlled diabetic range)  Date Value Ref Range Status  12/19/2021 6.3 0.0 - 7.0 % Final         Passed - Valid encounter within last 6 months    Recent Outpatient Visits          3 months ago Type 2 diabetes mellitus with hyperglycemia, without long-term current use of insulin ([place])   [place], [place], MD   6 months ago Type 2 diabetes mellitus with hyperglycemia, without long-term  current use of insulin ([place])   [place], [place], MD   11 months ago Type 2 diabetes mellitus with hyperglycemia, with long-term current use of insulin ([place])   [place], [place], MD   1 year ago Type 2 diabetes mellitus with hyperglycemia, with long-term current use of insulin [facility])   [place], Jarome Matin, RPH-CPP   1 year ago Type 2 diabetes mellitus with hyperglycemia, without long-term current use of insulin ([place])   [place], Enobong, MD      Future Appointments            In 3 months Charlott Rakes, MD [place]

## 2022-04-16 ENCOUNTER — Other Ambulatory Visit: Payer: Self-pay | Admitting: Family Medicine

## 2022-04-16 DIAGNOSIS — E1169 Type 2 diabetes mellitus with other specified complication: Secondary | ICD-10-CM

## 2022-04-16 DIAGNOSIS — E1165 Type 2 diabetes mellitus with hyperglycemia: Secondary | ICD-10-CM

## 2022-04-16 DIAGNOSIS — G8929 Other chronic pain: Secondary | ICD-10-CM

## 2022-04-16 NOTE — Telephone Encounter (Signed)
Medication Refill - Medication: metFORMIN (GLUCOPHAGE) 1000 MG tablet, atorvastatin (LIPITOR) 80 MG tablet, meloxicam (MOBIC) 7.5 MG tablet and the insulin that is taking at night  Has the patient contacted their pharmacy? Yes.    (Agent: If yes, when and what did the pharmacy advise?) Medication would be fill on Monday 04/16/2022  Preferred Pharmacy (with phone number or street name):  [place] Phone:  [phone]  Fax:  [phone]      Has the patient been seen for an appointment in the last year OR does the patient have an upcoming appointment? Yes.    Agent: Please be advised that RX refills may take up to 3 business days. We ask that you follow-up with your pharmacy.

## 2022-04-17 ENCOUNTER — Other Ambulatory Visit: Payer: Self-pay

## 2022-04-17 MED ORDER — ATORVASTATIN CALCIUM 80 MG PO TABS
ORAL_TABLET | Freq: Every day | ORAL | 2 refills | Status: DC
  Filled 2022-04-17: qty 30, 30d supply, fill #0

## 2022-04-17 NOTE — Telephone Encounter (Signed)
Refused the metformin, Mobic and Insulin Glargine because they are being requested too soon.    A 3 month supply of the atorvastatin was given.

## 2022-04-17 NOTE — Telephone Encounter (Signed)
Requested Prescriptions  Pending Prescriptions Disp Refills  . metFORMIN (GLUCOPHAGE) 1000 MG tablet 180 tablet 0    Sig: TAKE 1 TABLET (1,000 MG TOTAL) BY MOUTH 2 (TWO) TIMES DAILY WITH A MEAL.     Endocrinology:  Diabetes - Biguanides Failed - 04/16/2022  2:29 PM      Failed - Cr in normal range and within 360 days    Creat  Date Value Ref Range Status  02/06/2016 0.48 (L) 0.50 - 1.05 mg/dL Final    Comment:      For patients > or = 64 years of age: The upper reference limit for Creatinine is approximately 13% higher for people identified as African-American.      Creatinine, Ser  Date Value Ref Range Status  12/19/2021 0.56 (L) 0.57 - 1.00 mg/dL Final   Creatinine, Urine  Date Value Ref Range Status  02/06/2016 143 20 - 320 mg/dL Final         Failed - B12 Level in normal range and within 720 days    No results found for: "VITAMINB12"       Passed - HBA1C is between 0 and 7.9 and within 180 days    HbA1c, POC (controlled diabetic range)  Date Value Ref Range Status  12/19/2021 6.3 0.0 - 7.0 % Final         Passed - eGFR in normal range and within 360 days    GFR, Est African American  Date Value Ref Range Status  02/06/2016 >89 >=60 mL/min Final   GFR calc Af Amer  Date Value Ref Range Status  10/03/2020 112 >59 mL/min/1.73 Corrected    Comment:    **In accordance with recommendations from the NKF-ASN Task force,**   [facility] is in the process of updating its eGFR calculation to the   2021 CKD-EPI creatinine equation that estimates kidney function   without a race variable.    GFR, Est Non African American  Date Value Ref Range Status  02/06/2016 >89 >=60 mL/min Final   GFR, Estimated  Date Value Ref Range Status  06/01/2021 >60 >60 mL/min Final    Comment:    (NOTE) Calculated using the CKD-EPI Creatinine Equation (2021)    eGFR  Date Value Ref Range Status  12/19/2021 102 >59 mL/min/1.73 Final         Passed - Valid encounter within last 6  months    Recent Outpatient Visits          3 months ago Type 2 diabetes mellitus with hyperglycemia, without long-term current use of insulin ([place])   [place], [place], MD   6 months ago Type 2 diabetes mellitus with hyperglycemia, without long-term current use of insulin ([place])   [place], [place], MD   1 year ago Type 2 diabetes mellitus with hyperglycemia, with long-term current use of insulin ([place])   [place], [place], MD   1 year ago Type 2 diabetes mellitus with hyperglycemia, with long-term current use of insulin [facility])   [place], Jarome Matin, RPH-CPP   1 year ago Type 2 diabetes mellitus with hyperglycemia, without long-term current use of insulin ([place])   [place], MD      Future Appointments            In 2 months Charlott Rakes, MD [place]  Passed - CBC within normal limits and completed in the last 12 months    WBC  Date Value Ref Range Status  06/01/2021 10.3 4.0 - 10.5 K/uL Final   RBC  Date Value Ref Range Status  06/01/2021 4.33 3.87 - 5.11 MIL/uL Final   Hemoglobin  Date Value Ref Range Status  06/01/2021 13.2 12.0 - 15.0 g/dL Final   HCT  Date Value Ref Range Status  06/01/2021 39.9 36.0 - 46.0 % Final   MCHC  Date Value Ref Range Status  06/01/2021 33.1 30.0 - 36.0 g/dL Final   [facility]  Date Value Ref Range Status  06/01/2021 30.5 26.0 - 34.0 pg Final   MCV  Date Value Ref Range Status  06/01/2021 92.1 80.0 - 100.0 fL Final   No results found for: "PLTCOUNTKUC", "LABPLAT", "POCPLA" RDW  Date Value Ref Range Status  06/01/2021 13.1 11.5 - 15.5 % Final         . atorvastatin (LIPITOR) 80 MG tablet 30 tablet 6    Sig: TAKE 1 TABLET (80 MG TOTAL) BY MOUTH DAILY.     Cardiovascular:  Antilipid -  Statins Failed - 04/16/2022  2:29 PM      Failed - Lipid Panel in normal range within the last 12 months    Cholesterol, Total  Date Value Ref Range Status  12/19/2021 144 100 - 199 mg/dL Final   LDL Chol Calc (NIH)  Date Value Ref Range Status  12/19/2021 77 0 - 99 mg/dL Final   HDL  Date Value Ref Range Status  12/19/2021 52 >39 mg/dL Final   Triglycerides  Date Value Ref Range Status  12/19/2021 80 0 - 149 mg/dL Final         Passed - Patient is not pregnant      Passed - Valid encounter within last 12 months    Recent Outpatient Visits          3 months ago Type 2 diabetes mellitus with hyperglycemia, without long-term current use of insulin ([place])   [place], [place]ak[place], MD   6 months ago Type 2 diabetes mellitus with hyperglycemia, without long-term current use of insulin ([place])   [place], [place], MD   1 year ago Type 2 diabetes mellitus with hyperglycemia, with long-term current use of insulin ([place])   [place], [place], MD   1 year ago Type 2 diabetes mellitus with hyperglycemia, with long-term current use of insulin [facility])   [place], Jarome Matin, RPH-CPP   1 year ago Type 2 diabetes mellitus with hyperglycemia, without long-term current use of insulin ([place])   [place], Enobong, MD      Future Appointments            In 2 months Charlott Rakes, MD [place]           . meloxicam (MOBIC) 7.5 MG tablet 30 tablet 1    Sig: Take 1 tablet (7.5 mg total) by mouth daily as needed for pain.     Analgesics:  COX2 Inhibitors Failed - 04/16/2022  2:29 PM      Failed - Manual Review: Labs are only required if the patient has taken medication for more than 8 weeks.      Failed - Cr in normal range and within 360 days  Creat  Date Value Ref  Range Status  02/06/2016 0.48 (L) 0.50 - 1.05 mg/dL Final    Comment:      For patients > or = 64 years of age: The upper reference limit for Creatinine is approximately 13% higher for people identified as African-American.      Creatinine, Ser  Date Value Ref Range Status  12/19/2021 0.56 (L) 0.57 - 1.00 mg/dL Final   Creatinine, Urine  Date Value Ref Range Status  02/06/2016 143 20 - 320 mg/dL Final         Passed - HGB in normal range and within 360 days    Hemoglobin  Date Value Ref Range Status  06/01/2021 13.2 12.0 - 15.0 g/dL Final         Passed - HCT in normal range and within 360 days    HCT  Date Value Ref Range Status  06/01/2021 39.9 36.0 - 46.0 % Final         Passed - AST in normal range and within 360 days    AST  Date Value Ref Range Status  12/19/2021 20 0 - 40 IU/L Final         Passed - ALT in normal range and within 360 days    ALT  Date Value Ref Range Status  12/19/2021 14 0 - 32 IU/L Final         Passed - eGFR is 30 or above and within 360 days    GFR, Est African American  Date Value Ref Range Status  02/06/2016 >89 >=60 mL/min Final   GFR calc Af Amer  Date Value Ref Range Status  10/03/2020 112 >59 mL/min/1.73 Corrected    Comment:    **In accordance with recommendations from the NKF-ASN Task force,**   [facility] is in the process of updating its eGFR calculation to the   2021 CKD-EPI creatinine equation that estimates kidney function   without a race variable.    GFR, Est Non African American  Date Value Ref Range Status  02/06/2016 >89 >=60 mL/min Final   GFR, Estimated  Date Value Ref Range Status  06/01/2021 >60 >60 mL/min Final    Comment:    (NOTE) Calculated using the CKD-EPI Creatinine Equation (2021)    eGFR  Date Value Ref Range Status  12/19/2021 102 >59 mL/min/1.73 Final         Passed - Patient is not pregnant      Passed - Valid encounter within last 12 months    Recent Outpatient Visits          3  months ago Type 2 diabetes mellitus with hyperglycemia, without long-term current use of insulin ([place])   [place], [place], MD   6 months ago Type 2 diabetes mellitus with hyperglycemia, without long-term current use of insulin ([place])   [place], [place], MD   1 year ago Type 2 diabetes mellitus with hyperglycemia, with long-term current use of insulin ([place])   [place], [place], MD   1 year ago Type 2 diabetes mellitus with hyperglycemia, with long-term current use of insulin [facility])   [place], Annie Main L, RPH-CPP   1 year ago Type 2 diabetes mellitus with hyperglycemia, without long-term current use of insulin ([place])   [place], Enobong, MD      Future Appointments  In 2 months Charlott Rakes, MD [place]           . Insulin Glargine (BASAGLAR KWIKPEN) 100 UNIT/ML 9 mL 2    Sig: Inject 32 Units into the skin at bedtime.     Endocrinology:  Diabetes - Insulins Passed - 04/16/2022  2:29 PM      Passed - HBA1C is between 0 and 7.9 and within 180 days    HbA1c, POC (controlled diabetic range)  Date Value Ref Range Status  12/19/2021 6.3 0.0 - 7.0 % Final         Passed - Valid encounter within last 6 months    Recent Outpatient Visits          3 months ago Type 2 diabetes mellitus with hyperglycemia, without long-term current use of insulin ([place])   [place], [place], MD   6 months ago Type 2 diabetes mellitus with hyperglycemia, without long-term current use of insulin ([place])   [place], [place], MD   1 year ago Type 2 diabetes mellitus with hyperglycemia, with long-term current use of insulin ([place])   [place], [place], MD    1 year ago Type 2 diabetes mellitus with hyperglycemia, with long-term current use of insulin [facility])   [place], RPH-CPP   1 year ago Type 2 diabetes mellitus with hyperglycemia, without long-term current use of insulin ([place])   [place], MD      Future Appointments            In 2 months Charlott Rakes, MD [place]

## 2022-04-24 ENCOUNTER — Other Ambulatory Visit: Payer: Self-pay

## 2022-06-12 ENCOUNTER — Other Ambulatory Visit: Payer: Self-pay

## 2022-06-21 ENCOUNTER — Other Ambulatory Visit: Payer: Self-pay

## 2022-06-21 ENCOUNTER — Other Ambulatory Visit: Payer: Self-pay | Admitting: Family Medicine

## 2022-06-21 DIAGNOSIS — E1169 Type 2 diabetes mellitus with other specified complication: Secondary | ICD-10-CM

## 2022-06-21 DIAGNOSIS — G8929 Other chronic pain: Secondary | ICD-10-CM

## 2022-06-21 DIAGNOSIS — E1165 Type 2 diabetes mellitus with hyperglycemia: Secondary | ICD-10-CM

## 2022-06-21 MED ORDER — BASAGLAR KWIKPEN 100 UNIT/ML ~~LOC~~ SOPN
32.0000 [IU] | PEN_INJECTOR | Freq: Every day | SUBCUTANEOUS | 2 refills | Status: DC
  Filled 2022-06-21: qty 9, 28d supply, fill #0
  Filled 2022-07-20: qty 9, 28d supply, fill #1
  Filled 2022-10-09: qty 9, 28d supply, fill #2

## 2022-06-21 MED ORDER — METFORMIN HCL 1000 MG PO TABS
ORAL_TABLET | Freq: Two times a day (BID) | ORAL | 0 refills | Status: DC
  Filled 2022-06-21: qty 180, fill #0

## 2022-06-21 MED ORDER — MELOXICAM 7.5 MG PO TABS
7.5000 mg | ORAL_TABLET | Freq: Every day | ORAL | 1 refills | Status: DC | PRN
  Filled 2022-06-21: qty 90, 90d supply, fill #0
  Filled 2022-10-09: qty 90, 90d supply, fill #1

## 2022-06-21 MED ORDER — ATORVASTATIN CALCIUM 80 MG PO TABS
80.0000 mg | ORAL_TABLET | Freq: Every day | ORAL | 1 refills | Status: DC
  Filled 2022-06-21: qty 90, fill #0
  Filled 2022-06-21: qty 90, 90d supply, fill #0
  Filled 2022-12-25: qty 90, 90d supply, fill #1

## 2022-06-21 NOTE — Telephone Encounter (Signed)
Patient requesting insulin pen needles not on current list, routing to provider.    Requested Prescriptions  Pending Prescriptions Disp Refills   metFORMIN (GLUCOPHAGE) 1000 MG tablet 180 tablet 0    Sig: TAKE 1 TABLET (1,000 MG TOTAL) BY MOUTH 2 (TWO) TIMES DAILY WITH A MEAL.     Endocrinology:  Diabetes - Biguanides Failed - 06/21/2022 12:20 PM      Failed - Cr in normal range and within 360 days    Creat  Date Value Ref Range Status  02/06/2016 0.48 (L) 0.50 - 1.05 mg/dL Final    Comment:      For patients > or = 64 years of age: The upper reference limit for Creatinine is approximately 13% higher for people identified as African-American.      Creatinine, Ser  Date Value Ref Range Status  12/19/2021 0.56 (L) 0.57 - 1.00 mg/dL Final   Creatinine, Urine  Date Value Ref Range Status  02/06/2016 143 20 - 320 mg/dL Final         Failed - HBA1C is between 0 and 7.9 and within 180 days    HbA1c, POC (controlled diabetic range)  Date Value Ref Range Status  12/19/2021 6.3 0.0 - 7.0 % Final         Failed - B12 Level in normal range and within 720 days    No results found for: "VITAMINB12"       Failed - Valid encounter within last 6 months    Recent Outpatient Visits           6 months ago Type 2 diabetes mellitus with hyperglycemia, without long-term current use of insulin ([place])   [place], [place], MD   9 months ago Type 2 diabetes mellitus with hyperglycemia, without long-term current use of insulin ([place])   [place], [place], MD   1 year ago Type 2 diabetes mellitus with hyperglycemia, with long-term current use of insulin ([place])   [place], [place], MD   1 year ago Type 2 diabetes mellitus with hyperglycemia, with long-term current use of insulin ([place])   [place], Annie Main L, RPH-CPP   1 year ago Type 2  diabetes mellitus with hyperglycemia, without long-term current use of insulin ([place])   [place], [place], MD       Future Appointments             In 4 days Charlott Rakes, MD [place]            Failed - CBC within normal limits and completed in the last 12 months    WBC  Date Value Ref Range Status  06/01/2021 10.3 4.0 - 10.5 K/uL Final   RBC  Date Value Ref Range Status  06/01/2021 4.33 3.87 - 5.11 MIL/uL Final   Hemoglobin  Date Value Ref Range Status  06/01/2021 13.2 12.0 - 15.0 g/dL Final   HCT  Date Value Ref Range Status  06/01/2021 39.9 36.0 - 46.0 % Final   MCHC  Date Value Ref Range Status  06/01/2021 33.1 30.0 - 36.0 g/dL Final   [facility]  Date Value Ref Range Status  06/01/2021 30.5 26.0 - 34.0 pg Final   MCV  Date Value Ref Range Status  06/01/2021 92.1 80.0 - 100.0 fL Final   No results found for: "PLTCOUNTKUC", "  LABPLAT", "POCPLA" RDW  Date Value Ref Range Status  06/01/2021 13.1 11.5 - 15.5 % Final         Passed - eGFR in normal range and within 360 days    GFR, Est African American  Date Value Ref Range Status  02/06/2016 >89 >=60 mL/min Final   GFR calc Af Amer  Date Value Ref Range Status  10/03/2020 112 >59 mL/min/1.73 Corrected    Comment:    **In accordance with recommendations from the NKF-ASN Task force,**   [facility] is in the process of updating its eGFR calculation to the   2021 CKD-EPI creatinine equation that estimates kidney function   without a race variable.    GFR, Est Non African American  Date Value Ref Range Status  02/06/2016 >89 >=60 mL/min Final   GFR, Estimated  Date Value Ref Range Status  06/01/2021 >60 >60 mL/min Final    Comment:    (NOTE) Calculated using the CKD-EPI Creatinine Equation (2021)    eGFR  Date Value Ref Range Status  12/19/2021 102 >59 mL/min/1.73 Final          meloxicam (MOBIC) 7.5 MG tablet 90 tablet 1     Sig: Take 1 tablet (7.5 mg total) by mouth daily as needed for pain.     Analgesics:  COX2 Inhibitors Failed - 06/21/2022 12:20 PM      Failed - Manual Review: Labs are only required if the patient has taken medication for more than 8 weeks.      Failed - HGB in normal range and within 360 days    Hemoglobin  Date Value Ref Range Status  06/01/2021 13.2 12.0 - 15.0 g/dL Final         Failed - Cr in normal range and within 360 days    Creat  Date Value Ref Range Status  02/06/2016 0.48 (L) 0.50 - 1.05 mg/dL Final    Comment:      For patients > or = 63 years of age: The upper reference limit for Creatinine is approximately 13% higher for people identified as African-American.      Creatinine, Ser  Date Value Ref Range Status  12/19/2021 0.56 (L) 0.57 - 1.00 mg/dL Final   Creatinine, Urine  Date Value Ref Range Status  02/06/2016 143 20 - 320 mg/dL Final         Failed - HCT in normal range and within 360 days    HCT  Date Value Ref Range Status  06/01/2021 39.9 36.0 - 46.0 % Final         Passed - AST in normal range and within 360 days    AST  Date Value Ref Range Status  12/19/2021 20 0 - 40 IU/L Final         Passed - ALT in normal range and within 360 days    ALT  Date Value Ref Range Status  12/19/2021 14 0 - 32 IU/L Final         Passed - eGFR is 30 or above and within 360 days    GFR, Est African American  Date Value Ref Range Status  02/06/2016 >89 >=60 mL/min Final   GFR calc Af Amer  Date Value Ref Range Status  10/03/2020 112 >59 mL/min/1.73 Corrected    Comment:    **In accordance with recommendations from the NKF-ASN Task force,**   [facility] is in the process of updating its eGFR calculation to the   2021 CKD-EPI creatinine equation  that estimates kidney function   without a race variable.    GFR, Est Non African American  Date Value Ref Range Status  02/06/2016 >89 >=60 mL/min Final   GFR, Estimated  Date Value Ref Range Status   06/01/2021 >60 >60 mL/min Final    Comment:    (NOTE) Calculated using the CKD-EPI Creatinine Equation (2021)    eGFR  Date Value Ref Range Status  12/19/2021 102 >59 mL/min/1.73 Final         Passed - Patient is not pregnant      Passed - Valid encounter within last 12 months    Recent Outpatient Visits           6 months ago Type 2 diabetes mellitus with hyperglycemia, without long-term current use of insulin ([place])   [place], [place], MD   9 months ago Type 2 diabetes mellitus with hyperglycemia, without long-term current use of insulin ([place])   [place], [place], MD   1 year ago Type 2 diabetes mellitus with hyperglycemia, with long-term current use of insulin ([place])   [place], [place], MD   1 year ago Type 2 diabetes mellitus with hyperglycemia, with long-term current use of insulin [facility])   [place], Annie Main L, RPH-CPP   1 year ago Type 2 diabetes mellitus with hyperglycemia, without long-term current use of insulin ([place])   [place], Charlane Ferretti, MD       Future Appointments             In 4 days Charlott Rakes, MD [place]             atorvastatin (LIPITOR) 80 MG tablet 90 tablet 1    Sig: TAKE 1 TABLET (80 MG TOTAL) BY MOUTH DAILY.     Cardiovascular:  Antilipid - Statins Failed - 06/21/2022 12:20 PM      Failed - Lipid Panel in normal range within the last 12 months    Cholesterol, Total  Date Value Ref Range Status  12/19/2021 144 100 - 199 mg/dL Final   LDL Chol Calc (NIH)  Date Value Ref Range Status  12/19/2021 77 0 - 99 mg/dL Final   HDL  Date Value Ref Range Status  12/19/2021 52 >39 mg/dL Final   Triglycerides  Date Value Ref Range Status  12/19/2021 80 0 - 149 mg/dL Final         Passed - Patient is not  pregnant      Passed - Valid encounter within last 12 months    Recent Outpatient Visits           6 months ago Type 2 diabetes mellitus with hyperglycemia, without long-term current use of insulin ([place])   [place], [place], MD   9 months ago Type 2 diabetes mellitus with hyperglycemia, without long-term current use of insulin ([place])   [place], [place], MD   1 year ago Type 2 diabetes mellitus with hyperglycemia, with long-term current use of insulin ([place])   [place], [place], MD   1 year ago Type 2 diabetes mellitus with hyperglycemia, with long-term current use of insulin [facility])   [place], Annie Main L, RPH-CPP   1 year ago Type 2  diabetes mellitus with hyperglycemia, without long-term current use of insulin ([place])   [place], Charlane Ferretti, MD       Future Appointments             In 4 days Charlott Rakes, MD [place]             Insulin Glargine [facility]) 100 UNIT/ML 9 mL 2    Sig: Inject 32 Units into the skin at bedtime.     Endocrinology:  Diabetes - Insulins Failed - 06/21/2022 12:20 PM      Failed - HBA1C is between 0 and 7.9 and within 180 days    HbA1c, POC (controlled diabetic range)  Date Value Ref Range Status  12/19/2021 6.3 0.0 - 7.0 % Final         Failed - Valid encounter within last 6 months    Recent Outpatient Visits           6 months ago Type 2 diabetes mellitus with hyperglycemia, without long-term current use of insulin ([place])   [place], Jennette, MD   9 months ago Type 2 diabetes mellitus with hyperglycemia, without long-term current use of insulin ([place])   [place], [place], MD   1 year ago Type 2 diabetes mellitus with  hyperglycemia, with long-term current use of insulin ([place])   [place], [place], MD   1 year ago Type 2 diabetes mellitus with hyperglycemia, with long-term current use of insulin [facility])   [place], Jarome Matin, RPH-CPP   1 year ago Type 2 diabetes mellitus with hyperglycemia, without long-term current use of insulin ([place])   [place], Enobong, MD       Future Appointments             In 4 days Charlott Rakes, MD [place]

## 2022-06-21 NOTE — Telephone Encounter (Signed)
Medication Refill - Medication: metFORMIN (GLUCOPHAGE) 1000 MG tablet, meloxicam (MOBIC) 7.5 MG tablet, atorvastatin (LIPITOR) 80 MG tablet, Insulin Glargine (BASAGLAR KWIKPEN) 100 UNIT/ML, and pt stated she needs needles as well.   Has the patient contacted their pharmacy? No. Pt stated they do not answer her.   (Agent: If yes, when and what did the pharmacy advise?)  Preferred Pharmacy (with phone number or street name):  [facility] [address], [place] [address]  Phone: [phone] Fax: [phone]  Hours: M-F 7:30a-6:00p   Has the patient been seen for an appointment in the last year OR does the patient have an upcoming appointment? Yes.    Agent: Please be advised that RX refills may take up to 3 business days. We ask that you follow-up with your pharmacy.

## 2022-06-22 ENCOUNTER — Other Ambulatory Visit: Payer: Self-pay

## 2022-06-25 ENCOUNTER — Encounter: Payer: Self-pay | Admitting: Family Medicine

## 2022-06-25 ENCOUNTER — Ambulatory Visit: Payer: Self-pay | Attending: Family Medicine | Admitting: Family Medicine

## 2022-06-25 ENCOUNTER — Other Ambulatory Visit: Payer: Self-pay

## 2022-06-25 VITALS — BP 141/81 | HR 76 | Wt 163.0 lb

## 2022-06-25 DIAGNOSIS — H44113 Panuveitis, bilateral: Secondary | ICD-10-CM

## 2022-06-25 DIAGNOSIS — E1165 Type 2 diabetes mellitus with hyperglycemia: Secondary | ICD-10-CM

## 2022-06-25 DIAGNOSIS — I10 Essential (primary) hypertension: Secondary | ICD-10-CM

## 2022-06-25 LAB — POCT GLYCOSYLATED HEMOGLOBIN (HGB A1C): HbA1c, POC (controlled diabetic range): 9.8 % — AB (ref 0.0–7.0)

## 2022-06-25 LAB — GLUCOSE, POCT (MANUAL RESULT ENTRY): POC Glucose: 206 mg/dl — AB (ref 70–99)

## 2022-06-25 MED ORDER — GLIPIZIDE 10 MG PO TABS
10.0000 mg | ORAL_TABLET | Freq: Two times a day (BID) | ORAL | 6 refills | Status: DC
  Filled 2022-06-25: qty 60, 30d supply, fill #0
  Filled 2022-07-20: qty 60, 30d supply, fill #1
  Filled 2022-10-09: qty 60, 30d supply, fill #2
  Filled 2022-12-25: qty 180, 90d supply, fill #3

## 2022-06-25 MED ORDER — METFORMIN HCL 1000 MG PO TABS
1000.0000 mg | ORAL_TABLET | Freq: Two times a day (BID) | ORAL | 1 refills | Status: DC
  Filled 2022-06-25: qty 180, fill #0
  Filled 2022-07-17 – 2022-07-20 (×2): qty 180, 90d supply, fill #0
  Filled 2022-10-09: qty 180, 90d supply, fill #1

## 2022-06-25 MED ORDER — LISINOPRIL 2.5 MG PO TABS
2.5000 mg | ORAL_TABLET | Freq: Every day | ORAL | 1 refills | Status: DC
  Filled 2022-06-25: qty 90, 90d supply, fill #0
  Filled 2022-12-25: qty 90, 90d supply, fill #1

## 2022-06-25 MED ORDER — INSULIN PEN NEEDLE 31G X 5 MM MISC
1.0000 | Freq: Every day | 5 refills | Status: DC
  Filled 2022-06-25: qty 100, 100d supply, fill #0

## 2022-06-25 NOTE — Progress Notes (Signed)
Subjective:  Patient ID: Margaret Austin, female    DOB: 11-09-1955  Age: 66 y.o. MRN: [id number]  CC: Diabetes and Medication Refill   HPI Margaret Austin is a 66 y.o. year old female with a history of Diabetes Mellitus type 2 (A1c 9.8), Hyperlipidemia, panuveitis of both eyes, left eye retinal detachment.  She is seen with the aid of a [employer] video interpreter.  Interval History:  A1c is 9.8 up from 6.3 previously. She is on Lantus 32 qhs in addition to Metformin. Glipizide and Victoza appears on her medication list however she has not been taking it. She informs me her Ophthalmologist had said one of her medications would cause hyperglycemia however upon review of her notes from [facility] I do not see her on Prednisone. She sometimes goes walking. Adherence with a diabetic diet cannot be ascertained.  She still has difficulty with her vision but states she has noticed slight improvement. She is currently on Humira, Cyclosporine, Azathioprine Denies additional concerns today.  Past Medical History:  Diagnosis Date   Diabetes mellitus without complication ([place])     No past surgical history on file.  Family History  Problem Relation Age of Onset   Autoimmune disease Neg Hx     Social History   Socioeconomic History   Marital status: Single    Spouse name: Not on file   Number of children: Not on file   Years of education: Not on file   Highest education level: Not on file  Occupational History   Not on file  Tobacco Use   Smoking status: Never   Smokeless tobacco: Never  Vaping Use   Vaping Use: Never used  Substance and Sexual Activity   Alcohol use: No   Drug use: No   Sexual activity: Yes    Birth control/protection: None  Other Topics Concern   Not on file  Social History Narrative   ** Merged History Encounter **       Social Determinants of Health   Financial Resource Strain: Not on file  Food Insecurity: Not on file   Transportation Needs: Not on file  Physical Activity: Not on file  Stress: Not on file  Social Connections: Not on file    No Known Allergies  Outpatient Medications Prior to Visit  Medication Sig Dispense Refill   atorvastatin (LIPITOR) 80 MG tablet Take 1 tablet (80 mg total) by mouth daily. 90 tablet 1   Blood Glucose Monitoring Suppl (TRUE METRIX METER) w/Device KIT Used as directed, 3 times daily. (Patient taking differently: Check blood sugar 1-2 times daily) 1 kit 0   fluticasone (FLONASE) 50 MCG/ACT nasal spray Place 2 sprays into both nostrils daily. 16 g 5   hydrocortisone 2.5 % cream Apply topically 2 (two) times daily. 30 g 0   Insulin Glargine (BASAGLAR KWIKPEN) 100 UNIT/ML Inject 32 Units into the skin at bedtime. 9 mL 2   meloxicam (MOBIC) 7.5 MG tablet Take 1 tablet (7.5 mg total) by mouth daily as needed for pain. 90 tablet 1   OVER THE COUNTER MEDICATION Take 1 tablet by mouth daily. Vitamin from [employer] [facility]     liraglutide (VICTOZA) 18 MG/3ML SOPN inject 1.8 MG into the skin ONCE DAILY 9 mL 1   metFORMIN (GLUCOPHAGE) 1000 MG tablet TAKE 1 TABLET (1,000 MG TOTAL) BY MOUTH 2 (TWO) TIMES DAILY WITH A MEAL. 180 tablet 0   glipiZIDE (GLUCOTROL) 10 MG tablet TAKE 1 TABLET (10 MG TOTAL) BY MOUTH  2 (TWO) TIMES DAILY BEFORE A MEAL. (Patient taking differently: Take 10 mg by mouth 2 (two) times daily.) 60 tablet 6   No facility-administered medications prior to visit.     ROS Review of Systems  Constitutional:  Negative for activity change and appetite change.  HENT:  Negative for sinus pressure and sore throat.   Eyes:  Positive for visual disturbance.  Respiratory:  Negative for chest tightness, shortness of breath and wheezing.   Cardiovascular:  Negative for chest pain and palpitations.  Gastrointestinal:  Negative for abdominal distention, abdominal pain and constipation.  Genitourinary: Negative.   Musculoskeletal: Negative.   Psychiatric/Behavioral:  Negative  for behavioral problems and dysphoric mood.     Objective:  BP (!) 141/81   Pulse 76   Wt 163 lb (73.9 kg)   SpO2 99%   BMI 29.81 kg/m      06/25/2022    3:41 PM 12/19/2021    3:30 PM 09/20/2021    2:33 PM  BP/Weight  Systolic BP 141 137 149  Diastolic BP 81 77 80  Wt. (Lbs) 163 156 156.4  BMI 29.81 kg/m2 28.53 kg/m2 28.61 kg/m2      Physical Exam Constitutional:      Appearance: She is well-developed.  Cardiovascular:     Rate and Rhythm: Normal rate.     Heart sounds: Normal heart sounds. No murmur heard. Pulmonary:     Effort: Pulmonary effort is normal.     Breath sounds: Normal breath sounds. No wheezing or rales.  Chest:     Chest wall: No tenderness.  Abdominal:     General: Bowel sounds are normal. There is no distension.     Palpations: Abdomen is soft. There is no mass.     Tenderness: There is no abdominal tenderness.  Musculoskeletal:        General: Normal range of motion.     Right lower leg: No edema.     Left lower leg: No edema.  Neurological:     Mental Status: She is alert and oriented to person, place, and time.  Psychiatric:        Mood and Affect: Mood normal.        Latest Ref Rng & Units 12/19/2021    4:28 PM 09/20/2021    3:10 PM 06/01/2021    2:13 AM  CMP  Glucose 70 - 99 mg/dL 80  74  108   BUN 8 - 27 mg/dL 7  8  8   Creatinine 0.57 - 1.00 mg/dL 0.56  0.55  0.48   Sodium 134 - 144 mmol/L 142  144  139   Potassium 3.5 - 5.2 mmol/L 4.5  4.3  3.6   Chloride 96 - 106 mmol/L 104  104  106   CO2 20 - 29 mmol/L 25  25  24   Calcium 8.7 - 10.3 mg/dL 9.7  9.3  8.8   Total Protein 6.0 - 8.5 g/dL 7.0  7.4    Total Bilirubin 0.0 - 1.2 mg/dL 1.0  1.1    Alkaline Phos 44 - 121 IU/L 103  120    AST 0 - 40 IU/L 20  21    ALT 0 - 32 IU/L 14  21      Lipid Panel     Component Value Date/Time   CHOL 144 12/19/2021 1628   TRIG 80 12/19/2021 1628   HDL 52 12/19/2021 1628   CHOLHDL 3.2 10/03/2020 1639   CHOLHDL 5.2 (H) 02/06/2016   1557   VLDL  42 (H) 02/06/2016 1557   LDLCALC 77 12/19/2021 1628    CBC    Component Value Date/Time   WBC 10.3 06/01/2021 0213   RBC 4.33 06/01/2021 0213   HGB 13.2 06/01/2021 0213   HCT 39.9 06/01/2021 0213   PLT 278 06/01/2021 0213   MCV 92.1 06/01/2021 0213   MCH 30.5 06/01/2021 0213   MCHC 33.1 06/01/2021 0213   RDW 13.1 06/01/2021 0213   LYMPHSABS 3.2 05/31/2021 1735   MONOABS 0.5 05/31/2021 1735   EOSABS 0.3 05/31/2021 1735   BASOSABS 0.0 05/31/2021 1735    Lab Results  Component Value Date   HGBA1C 9.8 (A) 06/25/2022    Assessment & Plan:  1. Type 2 diabetes mellitus with hyperglycemia, without long-term current use of insulin (HCC) Uncontrolled with A1c of 9.8, goal is <7.0 She has not been taking Glipizide or Victoza Restarted Glipizide, continue current Lantus dose Consider addition of long acting GLP1 RA at next visit if still uncontrolled Counseled on Diabetic diet, my plate method, 150 minutes of moderate intensity exercise/week Blood sugar logs with fasting goals of 80-120 mg/dl, random of less than 180 and in the event of sugars less than 60 mg/dl or greater than 400 mg/dl encouraged to notify the clinic. Advised on the need for annual eye exams, annual foot exams, Pneumonia vaccine. - POCT glucose (manual entry) - POCT glycosylated hemoglobin (Hb A1C) - metFORMIN (GLUCOPHAGE) 1000 MG tablet; Take 1 tablet (1,000 mg total) by mouth 2 (two) times daily with a meal.  Dispense: 180 tablet; Refill: 1 - glipiZIDE (GLUCOTROL) 10 MG tablet; Take 1 tablet (10 mg total) by mouth 2 (two) times daily.  Dispense: 60 tablet; Refill: 6 - Insulin Pen Needle 31G X 5 MM MISC; Use at bedtime  Dispense: 100 each; Refill: 5  2. Panuveitis of both eyes Improving Continue Humira, Azothioprine, Cyclosporine  3. Primary hypertension New diagnosis Initiate low dose ACEI Counseled on blood pressure goal of less than 130/80, low-sodium, DASH diet, medication compliance, 150 minutes of  moderate intensity exercise per week. Discussed medication compliance, adverse effects.  - lisinopril (ZESTRIL) 2.5 MG tablet; Take 1 tablet (2.5 mg total) by mouth daily.  Dispense: 90 tablet; Refill: 1  Meds ordered this encounter  Medications   metFORMIN (GLUCOPHAGE) 1000 MG tablet    Sig: Take 1 tablet (1,000 mg total) by mouth 2 (two) times daily with a meal.    Dispense:  180 tablet    Refill:  1   glipiZIDE (GLUCOTROL) 10 MG tablet    Sig: Take 1 tablet (10 mg total) by mouth 2 (two) times daily.    Dispense:  60 tablet    Refill:  6   Insulin Pen Needle 31G X 5 MM MISC    Sig: Use at bedtime    Dispense:  100 each    Refill:  5   lisinopril (ZESTRIL) 2.5 MG tablet    Sig: Take 1 tablet (2.5 mg total) by mouth daily.    Dispense:  90 tablet    Refill:  1    Follow-up: Return in about 1 month (around 07/25/2022) for CPE/ Preventive Health Exam.       Enobong Newlin, MD, FAAFP. [facility] [place], [place] [phone]   06/25/2022, 5:37 PM 

## 2022-07-17 ENCOUNTER — Other Ambulatory Visit: Payer: Self-pay | Admitting: Family Medicine

## 2022-07-17 ENCOUNTER — Other Ambulatory Visit: Payer: Self-pay

## 2022-07-17 DIAGNOSIS — G8929 Other chronic pain: Secondary | ICD-10-CM

## 2022-07-17 DIAGNOSIS — E1165 Type 2 diabetes mellitus with hyperglycemia: Secondary | ICD-10-CM

## 2022-07-17 NOTE — Telephone Encounter (Signed)
Medication Refill - Medication: metFORMIN (GLUCOPHAGE) 1000 MG tablet, meloxicam (MOBIC) 7.5 MG tablet ,  Has the patient contacted their pharmacy? Yes.    (Agent: If yes, when and what did the pharmacy advise?)  Preferred Pharmacy (with phone number or street name):  [facility] [address], [place] [address]  Phone: [phone] Fax: [phone]  Hours: M-F 7:30a-6:00p   Has the patient been seen for an appointment in the last year OR does the patient have an upcoming appointment? Yes.    Agent: Please be advised that RX refills may take up to 3 business days. We ask that you follow-up with your pharmacy.

## 2022-07-18 NOTE — Telephone Encounter (Signed)
Metformin last reordered 06/25/22 #180 1 RF Mobic last reordered 06/21/22 #90 1 RF  Requested Prescriptions  Refused Prescriptions Disp Refills   metFORMIN (GLUCOPHAGE) 1000 MG tablet 180 tablet 1    Sig: Take 1 tablet (1,000 mg total) by mouth 2 (two) times daily with a meal.     Endocrinology:  Diabetes - Biguanides Failed - 07/17/2022  1:30 PM      Failed - Cr in normal range and within 360 days    Creat  Date Value Ref Range Status  02/06/2016 0.48 (L) 0.50 - 1.05 mg/dL Final    Comment:      For patients > or = 64 years of age: The upper reference limit for Creatinine is approximately 13% higher for people identified as African-American.      Creatinine, Ser  Date Value Ref Range Status  12/19/2021 0.56 (L) 0.57 - 1.00 mg/dL Final   Creatinine, Urine  Date Value Ref Range Status  02/06/2016 143 20 - 320 mg/dL Final         Failed - HBA1C is between 0 and 7.9 and within 180 days    HbA1c, POC (controlled diabetic range)  Date Value Ref Range Status  06/25/2022 9.8 (A) 0.0 - 7.0 % Final         Failed - B12 Level in normal range and within 720 days    No results found for: "VITAMINB12"       Failed - CBC within normal limits and completed in the last 12 months    WBC  Date Value Ref Range Status  06/01/2021 10.3 4.0 - 10.5 K/uL Final   RBC  Date Value Ref Range Status  06/01/2021 4.33 3.87 - 5.11 MIL/uL Final   Hemoglobin  Date Value Ref Range Status  06/01/2021 13.2 12.0 - 15.0 g/dL Final   HCT  Date Value Ref Range Status  06/01/2021 39.9 36.0 - 46.0 % Final   MCHC  Date Value Ref Range Status  06/01/2021 33.1 30.0 - 36.0 g/dL Final   [facility]  Date Value Ref Range Status  06/01/2021 30.5 26.0 - 34.0 pg Final   MCV  Date Value Ref Range Status  06/01/2021 92.1 80.0 - 100.0 fL Final   No results found for: "PLTCOUNTKUC", "LABPLAT", "POCPLA" RDW  Date Value Ref Range Status  06/01/2021 13.1 11.5 - 15.5 % Final         Passed - eGFR in normal  range and within 360 days    GFR, Est African American  Date Value Ref Range Status  02/06/2016 >89 >=60 mL/min Final   GFR calc Af Amer  Date Value Ref Range Status  10/03/2020 112 >59 mL/min/1.73 Corrected    Comment:    **In accordance with recommendations from the NKF-ASN Task force,**   [facility] is in the process of updating its eGFR calculation to the   2021 CKD-EPI creatinine equation that estimates kidney function   without a race variable.    GFR, Est Non African American  Date Value Ref Range Status  02/06/2016 >89 >=60 mL/min Final   GFR, Estimated  Date Value Ref Range Status  06/01/2021 >60 >60 mL/min Final    Comment:    (NOTE) Calculated using the CKD-EPI Creatinine Equation (2021)    eGFR  Date Value Ref Range Status  12/19/2021 102 >59 mL/min/1.73 Final         Passed - Valid encounter within last 6 months    Recent Outpatient Visits  3 weeks ago Type 2 diabetes mellitus with hyperglycemia, without long-term current use of insulin ([place])   [place], [place], MD   7 months ago Type 2 diabetes mellitus with hyperglycemia, without long-term current use of insulin ([place])   [place], [place], MD   10 months ago Type 2 diabetes mellitus with hyperglycemia, without long-term current use of insulin ([place])   [place], [place], MD   1 year ago Type 2 diabetes mellitus with hyperglycemia, with long-term current use of insulin ([place])   [place], Charlane Ferretti, MD   1 year ago Type 2 diabetes mellitus with hyperglycemia, with long-term current use of insulin [facility])   [place], Jarome Matin, RPH-CPP       Future Appointments             In 1 month Charlott Rakes, MD [place]             meloxicam (MOBIC) 7.5 MG tablet 90  tablet 1    Sig: Take 1 tablet (7.5 mg total) by mouth daily as needed for pain.     Analgesics:  COX2 Inhibitors Failed - 07/17/2022  1:30 PM      Failed - Manual Review: Labs are only required if the patient has taken medication for more than 8 weeks.      Failed - HGB in normal range and within 360 days    Hemoglobin  Date Value Ref Range Status  06/01/2021 13.2 12.0 - 15.0 g/dL Final         Failed - Cr in normal range and within 360 days    Creat  Date Value Ref Range Status  02/06/2016 0.48 (L) 0.50 - 1.05 mg/dL Final    Comment:      For patients > or = 64 years of age: The upper reference limit for Creatinine is approximately 13% higher for people identified as African-American.      Creatinine, Ser  Date Value Ref Range Status  12/19/2021 0.56 (L) 0.57 - 1.00 mg/dL Final   Creatinine, Urine  Date Value Ref Range Status  02/06/2016 143 20 - 320 mg/dL Final         Failed - HCT in normal range and within 360 days    HCT  Date Value Ref Range Status  06/01/2021 39.9 36.0 - 46.0 % Final         Passed - AST in normal range and within 360 days    AST  Date Value Ref Range Status  12/19/2021 20 0 - 40 IU/L Final         Passed - ALT in normal range and within 360 days    ALT  Date Value Ref Range Status  12/19/2021 14 0 - 32 IU/L Final         Passed - eGFR is 30 or above and within 360 days    GFR, Est African American  Date Value Ref Range Status  02/06/2016 >89 >=60 mL/min Final   GFR calc Af Amer  Date Value Ref Range Status  10/03/2020 112 >59 mL/min/1.73 Corrected    Comment:    **In accordance with recommendations from the NKF-ASN Task force,**   [facility] is in the process of updating its eGFR calculation to the   2021 CKD-EPI creatinine equation that estimates kidney function  without a race variable.    GFR, Est Non African American  Date Value Ref Range Status  02/06/2016 >89 >=60 mL/min Final   GFR, Estimated  Date Value Ref Range  Status  06/01/2021 >60 >60 mL/min Final    Comment:    (NOTE) Calculated using the CKD-EPI Creatinine Equation (2021)    eGFR  Date Value Ref Range Status  12/19/2021 102 >59 mL/min/1.73 Final         Passed - Patient is not pregnant      Passed - Valid encounter within last 12 months    Recent Outpatient Visits           3 weeks ago Type 2 diabetes mellitus with hyperglycemia, without long-term current use of insulin ([place])   [place], [place], MD   7 months ago Type 2 diabetes mellitus with hyperglycemia, without long-term current use of insulin ([place])   [place], [place], MD   10 months ago Type 2 diabetes mellitus with hyperglycemia, without long-term current use of insulin ([place])   [place], [place], MD   1 year ago Type 2 diabetes mellitus with hyperglycemia, with long-term current use of insulin ([place])   [place], [place], MD   1 year ago Type 2 diabetes mellitus with hyperglycemia, with long-term current use of insulin [facility])   [place], RPH-CPP       Future Appointments             In 1 month Charlott Rakes, MD [place]

## 2022-07-20 ENCOUNTER — Other Ambulatory Visit: Payer: Self-pay

## 2022-07-24 ENCOUNTER — Other Ambulatory Visit: Payer: Self-pay

## 2022-07-27 ENCOUNTER — Other Ambulatory Visit: Payer: Self-pay

## 2022-08-07 ENCOUNTER — Other Ambulatory Visit: Payer: Self-pay

## 2022-08-27 ENCOUNTER — Ambulatory Visit: Payer: Self-pay | Attending: Family Medicine | Admitting: Family Medicine

## 2022-08-27 ENCOUNTER — Other Ambulatory Visit (HOSPITAL_COMMUNITY)
Admission: RE | Admit: 2022-08-27 | Discharge: 2022-08-27 | Disposition: A | Discharge status: Home / Self Care | Payer: Self-pay | Source: Ambulatory Visit | Attending: Family Medicine | Admitting: Family Medicine

## 2022-08-27 ENCOUNTER — Encounter: Payer: Self-pay | Admitting: Family Medicine

## 2022-08-27 ENCOUNTER — Other Ambulatory Visit: Payer: Self-pay

## 2022-08-27 VITALS — BP 116/76 | HR 80 | Temp 98.2°F | Ht 62.0 in | Wt 166.6 lb

## 2022-08-27 DIAGNOSIS — Z1231 Encounter for screening mammogram for malignant neoplasm of breast: Secondary | ICD-10-CM

## 2022-08-27 DIAGNOSIS — Z0000 Encounter for general adult medical examination without abnormal findings: Secondary | ICD-10-CM

## 2022-08-27 DIAGNOSIS — Z1211 Encounter for screening for malignant neoplasm of colon: Secondary | ICD-10-CM

## 2022-08-27 DIAGNOSIS — Z124 Encounter for screening for malignant neoplasm of cervix: Secondary | ICD-10-CM | POA: Insufficient documentation

## 2022-08-27 DIAGNOSIS — Z23 Encounter for immunization: Secondary | ICD-10-CM

## 2022-08-27 MED ORDER — ZOSTER VAC RECOMB ADJUVANTED 50 MCG/0.5ML IM SUSR: 0.5000 mL | Freq: Once | INTRAMUSCULAR | 1 refills | Status: AC

## 2022-08-27 NOTE — Progress Notes (Signed)
Subjective:  Patient ID: Margaret Austin, female    DOB: [birth year]  Age: 67 y.o. MRN: [id number]  CC: Annual Exam and Gynecologic Exam   HPI Margaret Austin is a 67 y.o. year old female with a history of Diabetes Mellitus type 2 (A1c 9.8), Hyperlipidemia, panuveitis of both eyes, left eye retinal detachment.  She is seen with the aid of a [employer] video interpreter.  Interval History:  Presents today for complete physical exam. She is due for colorectal, breast cancer, cervical cancer screening.  Mammogram was normal in 09/2019 and last Pap smear was normal in 06/2019 Denies presence of additional concerns today.   Past Medical History:  Diagnosis Date   Diabetes mellitus without complication ([place])     No past surgical history on file.  Family History  Problem Relation Age of Onset   Autoimmune disease Neg Hx     Social History   Socioeconomic History   Marital status: Single    Spouse name: Not on file   Number of children: Not on file   Years of education: Not on file   Highest education level: Not on file  Occupational History   Not on file  Tobacco Use   Smoking status: Never   Smokeless tobacco: Never  Vaping Use   Vaping Use: Never used  Substance and Sexual Activity   Alcohol use: No   Drug use: No   Sexual activity: Yes    Birth control/protection: None  Other Topics Concern   Not on file  Social History Narrative   ** Merged History Encounter **       Social Determinants of Health   Financial Resource Strain: Not on file  Food Insecurity: Not on file  Transportation Needs: Not on file  Physical Activity: Not on file  Stress: Not on file  Social Connections: Not on file    No Known Allergies  Outpatient Medications Prior to Visit  Medication Sig Dispense Refill   atorvastatin (LIPITOR) 80 MG tablet Take 1 tablet (80 mg total) by mouth daily. 90 tablet 1   Blood Glucose Monitoring Suppl (TRUE METRIX METER) w/Device KIT Used  as directed, 3 times daily. (Patient taking differently: Check blood sugar 1-2 times daily) 1 kit 0   fluticasone (FLONASE) 50 MCG/ACT nasal spray Place 2 sprays into both nostrils daily. 16 g 5   glipiZIDE (GLUCOTROL) 10 MG tablet Take 1 tablet (10 mg total) by mouth 2 (two) times daily. 60 tablet 6   hydrocortisone 2.5 % cream Apply topically 2 (two) times daily. 30 g 0   Insulin Glargine (BASAGLAR KWIKPEN) 100 UNIT/ML Inject 32 Units into the skin at bedtime. 9 mL 2   Insulin Pen Needle 31G X 5 MM MISC Use at bedtime 100 each 5   lisinopril (ZESTRIL) 2.5 MG tablet Take 1 tablet (2.5 mg total) by mouth daily. 90 tablet 1   meloxicam (MOBIC) 7.5 MG tablet Take 1 tablet (7.5 mg total) by mouth daily as needed for pain. 90 tablet 1   metFORMIN (GLUCOPHAGE) 1000 MG tablet Take 1 tablet (1,000 mg total) by mouth 2 (two) times daily with a meal. 180 tablet 1   OVER THE COUNTER MEDICATION Take 1 tablet by mouth daily. Vitamin from [place] store     No facility-administered medications prior to visit.     ROS Review of Systems  Constitutional:  Negative for activity change, appetite change and fatigue.  HENT:  Negative for congestion, sinus pressure and sore throat.  Eyes:  Negative for visual disturbance.  Respiratory:  Negative for cough, chest tightness, shortness of breath and wheezing.   Cardiovascular:  Negative for chest pain and palpitations.  Gastrointestinal:  Negative for abdominal distention, abdominal pain and constipation.  Endocrine: Negative for polydipsia.  Genitourinary:  Negative for dysuria and frequency.  Musculoskeletal:  Negative for arthralgias and back pain.  Skin:  Negative for rash.  Neurological:  Negative for tremors, light-headedness and numbness.  Hematological:  Does not bruise/bleed easily.  Psychiatric/Behavioral:  Negative for agitation and behavioral problems.     Objective:  BP 116/76   Pulse 80   Temp 98.2 F (36.8 C) (Oral)   Ht '5\' 2"'$  (1.575 m)    Wt 166 lb 9.6 oz (75.6 kg)   SpO2 99%   BMI 30.47 kg/m      08/27/2022    2:48 PM 06/25/2022    3:41 PM 12/19/2021    3:30 PM  BP/Weight  Systolic BP 387 564 332  Diastolic BP 76 81 77  Wt. (Lbs) 166.6 163 156  BMI 30.47 kg/m2 29.81 kg/m2 28.53 kg/m2      Physical Exam Exam conducted with a chaperone present.  Constitutional:      General: She is not in acute distress.    Appearance: She is well-developed. She is not diaphoretic.  HENT:     Head: Normocephalic.     Right Ear: External ear normal.     Left Ear: External ear normal.     Nose: Nose normal.  Eyes:     Conjunctiva/sclera: Conjunctivae normal.     Pupils: Pupils are equal, round, and reactive to light.  Neck:     Vascular: No JVD.  Cardiovascular:     Rate and Rhythm: Normal rate and regular rhythm.     Heart sounds: Normal heart sounds. No murmur heard.    No gallop.  Pulmonary:     Effort: Pulmonary effort is normal. No respiratory distress.     Breath sounds: Normal breath sounds. No wheezing or rales.  Chest:     Chest wall: No tenderness.  Breasts:    Right: Normal. No mass, nipple discharge or tenderness.     Left: Normal. No mass, nipple discharge or tenderness.  Abdominal:     General: Bowel sounds are normal. There is no distension.     Palpations: Abdomen is soft. There is no mass.     Tenderness: There is no abdominal tenderness.     Hernia: There is no hernia in the left inguinal area or right inguinal area.  Genitourinary:    General: Normal vulva.     Pubic Area: No rash.      Labia:        Right: No rash.        Left: No rash.      Vagina: Normal.     Cervix: Normal.     Uterus: Normal.      Adnexa: Right adnexa normal and left adnexa normal.       Right: No tenderness.         Left: No tenderness.    Musculoskeletal:        General: No tenderness. Normal range of motion.     Cervical back: Normal range of motion. No tenderness.  Lymphadenopathy:     Upper Body:     Right  upper body: No supraclavicular or axillary adenopathy.     Left upper body: No supraclavicular or axillary adenopathy.  Skin:  General: Skin is warm and dry.  Neurological:     Mental Status: She is alert and oriented to person, place, and time.     Deep Tendon Reflexes: Reflexes are normal and symmetric.        Latest Ref Rng & Units 12/19/2021    4:28 PM 09/20/2021    3:10 PM 06/01/2021    2:13 AM  CMP  Glucose 70 - 99 mg/dL 80  74  108   BUN 8 - 27 mg/dL '7  8  8   '$ Creatinine 0.57 - 1.00 mg/dL 0.56  0.55  0.48   Sodium 134 - 144 mmol/L 142  144  139   Potassium 3.5 - 5.2 mmol/L 4.5  4.3  3.6   Chloride 96 - 106 mmol/L 104  104  106   CO2 20 - 29 mmol/L '25  25  24   '$ Calcium 8.7 - 10.3 mg/dL 9.7  9.3  8.8   Total Protein 6.0 - 8.5 g/dL 7.0  7.4    Total Bilirubin 0.0 - 1.2 mg/dL 1.0  1.1    Alkaline Phos 44 - 121 IU/L 103  120    AST 0 - 40 IU/L 20  21    ALT 0 - 32 IU/L 14  21      Lipid Panel     Component Value Date/Time   CHOL 144 12/19/2021 1628   TRIG 80 12/19/2021 1628   HDL 52 12/19/2021 1628   CHOLHDL 3.2 10/03/2020 1639   CHOLHDL 5.2 (H) 02/06/2016 1557   VLDL 42 (H) 02/06/2016 1557   LDLCALC 77 12/19/2021 1628    CBC    Component Value Date/Time   WBC 10.3 06/01/2021 0213   RBC 4.33 06/01/2021 0213   HGB 13.2 06/01/2021 0213   HCT 39.9 06/01/2021 0213   PLT 278 06/01/2021 0213   MCV 92.1 06/01/2021 0213   MCH 30.5 06/01/2021 0213   MCHC 33.1 06/01/2021 0213   RDW 13.1 06/01/2021 0213   LYMPHSABS 3.2 05/31/2021 1735   MONOABS 0.5 05/31/2021 1735   EOSABS 0.3 05/31/2021 1735   BASOSABS 0.0 05/31/2021 1735    Lab Results  Component Value Date   HGBA1C 9.8 (A) 06/25/2022    Assessment & Plan:  1. Annual physical exam Counseled on 150 minutes of exercise per week, healthy eating (including decreased daily intake of saturated fats, cholesterol, added sugars, sodium), STI prevention, routine healthcare maintenance.   2. Screening for cervical  cancer - Cytology - PAP  3. Encounter for screening mammogram for malignant neoplasm of breast - MS DIGITAL SCREENING TOMO BILATERAL; Future  4. Screening for colon cancer - Fecal occult blood, imunochemical  5. Need for immunization against influenza - Flu Vaccine QUAD 71moIM (Fluarix, Fluzone & Alfiuria Quad PF)    Meds ordered this encounter  Medications   Zoster Vaccine Adjuvanted ([facility] injection    Sig: Inject 0.5 mLs into the muscle once for 1 dose.    Dispense:  0.5 mL    Refill:  1    Follow-up: Return in about 6 weeks (around 10/08/2022) for Diabetes follow-up.       ECharlott Rakes MD, FAAFP. [facility]  08/27/2022, 4:53 PM

## 2022-08-27 NOTE — Progress Notes (Signed)
CPE

## 2022-08-27 NOTE — Patient Instructions (Signed)

## 2022-08-28 LAB — CYTOLOGY - PAP
Adequacy: ABSENT
Comment: NEGATIVE
Diagnosis: NEGATIVE
High risk HPV: NEGATIVE

## 2022-09-02 LAB — FECAL OCCULT BLOOD, IMMUNOCHEMICAL: Fecal Occult Bld: NEGATIVE

## 2022-10-09 ENCOUNTER — Ambulatory Visit: Payer: Self-pay | Attending: Family Medicine | Admitting: Family Medicine

## 2022-10-09 ENCOUNTER — Telehealth: Payer: Self-pay | Admitting: Family Medicine

## 2022-10-09 ENCOUNTER — Encounter: Payer: Self-pay | Admitting: Family Medicine

## 2022-10-09 ENCOUNTER — Other Ambulatory Visit: Payer: Self-pay

## 2022-10-09 VITALS — BP 134/79 | HR 71 | Temp 97.9°F | Ht 62.0 in | Wt 165.8 lb

## 2022-10-09 DIAGNOSIS — M25552 Pain in left hip: Secondary | ICD-10-CM

## 2022-10-09 DIAGNOSIS — E1165 Type 2 diabetes mellitus with hyperglycemia: Secondary | ICD-10-CM

## 2022-10-09 DIAGNOSIS — M722 Plantar fascial fibromatosis: Secondary | ICD-10-CM

## 2022-10-09 LAB — POCT GLYCOSYLATED HEMOGLOBIN (HGB A1C): HbA1c, POC (controlled diabetic range): 7.6 % — AB (ref 0.0–7.0)

## 2022-10-09 LAB — GLUCOSE, POCT (MANUAL RESULT ENTRY): POC Glucose: 90 mg/dl (ref 70–99)

## 2022-10-09 MED ORDER — NAPROXEN 500 MG PO TABS
500.0000 mg | ORAL_TABLET | Freq: Two times a day (BID) | ORAL | 1 refills | Status: DC
  Filled 2022-10-09: qty 60, 30d supply, fill #0

## 2022-10-09 MED ORDER — TIZANIDINE HCL 4 MG PO TABS
4.0000 mg | ORAL_TABLET | Freq: Three times a day (TID) | ORAL | 1 refills | Status: DC | PRN
  Filled 2022-10-09: qty 60, 20d supply, fill #0

## 2022-10-09 NOTE — Patient Instructions (Signed)
Fascitis plantar Plantar Fasciitis  La fascitis plantar es una afeccin dolorosa que se produce en el taln. Ocurre cuando la banda de tejido que [employer] dedos con el hueso del taln (fascia plantar) se irrita. Esto puede ocurrir por Financial planner ejercicio u otras actividades repetitivas (lesin por uso excesivo). La fascitis plantar puede causar desde una leve irritacin hasta dolor intenso que dificulta que la persona camine o se [place]. Por lo general, el dolor es peor a la maana despus de dormir, o despus de Public affairs consultant sentado o acostado durante un perodo de [place]. El dolor tambin puede empeorar despus de caminar o estar de pie por [employer]. Cules son las causas? Esta afeccin puede ser causada por lo siguiente: Estar de pie durante largos perodos. Usar zapatos que no tengan un buen soporte para el arco. Realizar actividades que implican esfuerzo para las articulaciones (actividades de alto impacto). Esto incluye el ballet y la actividad fsica que hace que el corazn lata ms rpido (ejercicio aerbico), Solicitor. Tener sobrepeso. Tener una forma de caminar (andar) anormal. Presentar rigidez muscular en la parte posterior de la parte inferior de la pierna (pantorrilla). Arcos [employer] o pies planos. Comenzar una nueva actividad fsica. Cules son los signos o sntomas? El sntoma principal de esta afeccin es el dolor en el taln. El dolor puede empeorar despus de lo siguiente: Con los primeros pasos luego de estar en reposo, especialmente por la maana despus de dormir o de haber estado sentado o acostado durante un [place]. Largos perodos de Public affairs consultant de pie. El dolor puede disminuir despus de 30 a 45 minutos de [place], como caminar apaciblemente. Cmo se diagnostica? Esta afeccin se puede diagnosticar en funcin de los antecedentes mdicos, un examen fsico y los sntomas. El mdico controlar lo siguiente: Un rea dolorida en la parte inferior del  pie. [place] alto en el pie o pies planos. Dolor al Agricultural consultant. Dificultad para mover el pie. Pueden realizarle estudios de diagnstico por imagen para confirmar el diagnstico, por ejemplo: Radiografas. Ecografa. Resonancia magntica (RM). Cmo se trata? El tratamiento de la fascitis plantar depende de la gravedad de su afeccin. El tratamiento puede incluir: Reposo, hielo, presin (compresin) y Lexicographer (elevar) el pie afectado. Esto se denomina tratamiento de RHCE (reposo, hielo, compresin, elevacin). El mdico puede recomendarle terapia de RHCE junto con medicamentos de venta libre para Best boy. Ejercicios para [place] pantorrillas y la fascia plantar. Una frula que [employer] estirado y [place] mientras usted duerme (frula nocturna). Fisioterapia para [employer] sntomas y Customer service manager en el futuro. Inyecciones de medicamentos con corticoesteroides (cortisona) para Best boy y la inflamacin. Estimular su fascia plantar lesionada con impulsos elctricos (tratamiento con ondas de choque extracorpreas). Esto generalmente es la ltima opcin de tratamiento antes de la [place]. [place], si los otros tratamientos no han funcionado despus de 12 meses. Siga estas instrucciones en su casa: Control del dolor, la rigidez y la hinchazn  Si se lo indican, aplique hielo sobre la zona dolorida. Para hacer esto: Ponga el hielo en una bolsa de plstico o use una botella de [place]. Coloque una toalla entre la piel y la bolsa de hielo o la botella. Frote la parte inferior del pie sobre la bolsa o la botella. Haga esto durante 20 minutos, de 2 a 3 veces al [employer]. Use calzado deportivo con amortiguacin de aire o gel, o pruebe usar plantillas blandas diseadas para la fascitis plantar. Cuando est  sentado o acostado, eleve el pie por encima del nivel del corazn. Actividad Evite las actividades que le causan dolor. Pregntele al mdico qu actividades  son seguras para usted. Haga los ejercicios de fisioterapia y estiramiento como se lo haya indicado el mdico. Intente hacer actividades y tipos de ejercicio que sean ms suaves para las articulaciones (de bajo impacto). Por ejemplo, nadar, hacer ejercicios [employer], y Catering manager. Instrucciones generales Use los medicamentos de venta libre y los recetados solamente como se lo haya indicado el mdico. Si el mdico se lo indica, use una frula nocturna para dormir. Afloje la frula si los dedos de los pies se le entumecen, siente hormigueos o se le enfran y se tornan de Optician, dispensing. Mantenga un peso saludable, o colabore con su mdico para perder [employer]. Cumpla con todas las visitas de seguimiento. Esto es importante. Comunquese con un mdico si tiene: Sntomas que no desaparecen con Teacher, early years/pre. Dolor que [place]. Dolor que afecta su capacidad de moverse o de Optometrist sus actividades diarias. Resumen La fascitis plantar es una afeccin dolorosa que se produce en el taln. Ocurre cuando la banda de tejido que [employer] dedos con el hueso del taln (fascia plantar) se irrita. El dolor en el taln es el sntoma principal de esta afeccin. Puede empeorar despus de Buyer, retail ejercicio o de Public affairs consultant quieto de pie durante mucho tiempo. El tratamiento vara, pero normalmente comienza con el reposo, la aplicacin de hielo, la aplicacin de presin (compresin) y la elevacin del pie afectado. Esto se denomina tratamiento de RHCE (reposo, hielo, compresin, elevacin). Tambin se pueden usar analgsicos de venta libre para Financial controller. Esta informacin no tiene Marine scientist el consejo del mdico. Asegrese de hacerle al mdico cualquier pregunta que tenga. Document Revised: 02/08/2020 Document Reviewed: 02/08/2020 Elsevier Patient Education  [place].

## 2022-10-09 NOTE — Telephone Encounter (Signed)
Medication Refill - Medication: metFORMIN (GLUCOPHAGE) 1000 MG tablet , Insulin Glargine (BASAGLAR KWIKPEN) 100 UNIT/ML , atorvastatin (LIPITOR) 80 MG tablet , meloxicam (MOBIC) 7.5 MG tablet , glipiZIDE (GLUCOTROL) 10 MG tablet   Has the patient contacted their pharmacy? No. Stated the pharmacy doesn't answer her   (Agent: If yes, when and what did the pharmacy advise?)  Preferred Pharmacy (with phone number or street name):  [facility] [address], [place] [address]  Phone: [phone] Fax: [phone]  Hours: M-F 7:30a-6:00p   Has the patient been seen for an appointment in the last year OR does the patient have an upcoming appointment? Yes.    Agent: Please be advised that RX refills may take up to 3 business days. We ask that you follow-up with your pharmacy.

## 2022-10-09 NOTE — Progress Notes (Signed)
Subjective:  Patient ID: Margaret Austin, female    DOB: 24-Sep-1955  Age: 67 y.o. MRN: [id number]  CC: Diabetes   HPI [name] is a 67 y.o. year old female with a history of  Diabetes Mellitus type 2 (A1c 7.6), Hyperlipidemia, panuveitis of both eyes, left eye retinal detachment.   Interval History:  She Complains of left hip pain which radiates down her leg which radiates to her left foot. Pain is worse at night and is rated as a 9/10 with associated numbness and tingling and she states she is on her feet all day at work.  On further questioning at the moment she tells me she currently has pain in her left hip and on the medial aspect of her left foot.  A1c is 7.5 down from 9.8 previously.  She has had no hypoglycemia.  Denies presence of neuropathy.  She does have abnormal vision and is under the care of of a retina specialist. She exercises by means of walking. Past Medical History:  Diagnosis Date   Diabetes mellitus without complication ([place])     No past surgical history on file.  Family History  Problem Relation Age of Onset   Autoimmune disease Neg Hx     Social History   Socioeconomic History   Marital status: Single    Spouse name: Not on file   Number of children: Not on file   Years of education: Not on file   Highest education level: Not on file  Occupational History   Not on file  Tobacco Use   Smoking status: Never   Smokeless tobacco: Never  Vaping Use   Vaping Use: Never used  Substance and Sexual Activity   Alcohol use: No   Drug use: No   Sexual activity: Yes    Birth control/protection: None  Other Topics Concern   Not on file  Social History Narrative   ** Merged History Encounter **       Social Determinants of Health   Financial Resource Strain: Not on file  Food Insecurity: Not on file  Transportation Needs: Not on file  Physical Activity: Not on file  Stress: Not on file  Social Connections: Not on file     No Known Allergies  Outpatient Medications Prior to Visit  Medication Sig Dispense Refill   atorvastatin (LIPITOR) 80 MG tablet Take 1 tablet (80 mg total) by mouth daily. 90 tablet 1   Blood Glucose Monitoring Suppl (TRUE METRIX METER) w/Device KIT Used as directed, 3 times daily. (Patient taking differently: Check blood sugar 1-2 times daily) 1 kit 0   fluticasone (FLONASE) 50 MCG/ACT nasal spray Place 2 sprays into both nostrils daily. 16 g 5   glipiZIDE (GLUCOTROL) 10 MG tablet Take 1 tablet (10 mg total) by mouth 2 (two) times daily. 60 tablet 6   hydrocortisone 2.5 % cream Apply topically 2 (two) times daily. 30 g 0   Insulin Glargine (BASAGLAR KWIKPEN) 100 UNIT/ML Inject 32 Units into the skin at bedtime. 9 mL 2   Insulin Pen Needle 31G X 5 MM MISC Use at bedtime 100 each 5   lisinopril (ZESTRIL) 2.5 MG tablet Take 1 tablet (2.5 mg total) by mouth daily. 90 tablet 1   meloxicam (MOBIC) 7.5 MG tablet Take 1 tablet (7.5 mg total) by mouth daily as needed for pain. 90 tablet 1   metFORMIN (GLUCOPHAGE) 1000 MG tablet Take 1 tablet (1,000 mg total) by mouth 2 (two) times daily with  a meal. 180 tablet 1   OVER THE COUNTER MEDICATION Take 1 tablet by mouth daily. Vitamin from [place] store     No facility-administered medications prior to visit.     ROS Review of Systems  Constitutional:  Negative for activity change and appetite change.  HENT:  Negative for sinus pressure and sore throat.   Eyes:  Positive for visual disturbance.  Respiratory:  Negative for chest tightness, shortness of breath and wheezing.   Cardiovascular:  Negative for chest pain and palpitations.  Gastrointestinal:  Negative for abdominal distention, abdominal pain and constipation.  Genitourinary: Negative.   Musculoskeletal:        See HPI  Psychiatric/Behavioral:  Negative for behavioral problems and dysphoric mood.     Objective:  BP 134/79   Pulse 71   Temp 97.9 F (36.6 C) (Oral)   Ht 5' 2"$   (1.575 m)   Wt 165 lb 12.8 oz (75.2 kg)   SpO2 99%   BMI 30.33 kg/m      10/09/2022    2:41 PM 08/27/2022    2:48 PM 06/25/2022    3:41 PM  BP/Weight  Systolic BP [id number]  Diastolic BP 79 76 81  Wt. (Lbs) 165.8 166.6 163  BMI 30.33 kg/m2 30.47 kg/m2 29.81 kg/m2      Physical Exam Constitutional:      Appearance: She is well-developed.  Cardiovascular:     Rate and Rhythm: Normal rate.     Heart sounds: Normal heart sounds. No murmur heard. Pulmonary:     Effort: Pulmonary effort is normal.     Breath sounds: Normal breath sounds. No wheezing or rales.  Chest:     Chest wall: No tenderness.  Abdominal:     General: Bowel sounds are normal. There is no distension.     Palpations: Abdomen is soft. There is no mass.     Tenderness: There is no abdominal tenderness.  Musculoskeletal:     Right lower leg: No edema.     Left lower leg: No edema.     Comments: Negative straight leg raise bilaterally Slight tenderness on palpation of medial aspect of left foot.  Normal dorsiflexion Slight tenderness on deep palpation of lateral aspect of left hip and on range of motion  Neurological:     Mental Status: She is alert and oriented to person, place, and time.  Psychiatric:        Mood and Affect: Mood normal.    Diabetic Foot Exam - Simple   Simple Foot Form Diabetic Foot exam was performed with the following findings: Yes 10/09/2022  3:08 PM  Visual Inspection No deformities, no ulcerations, no other skin breakdown bilaterally: Yes Sensation Testing Intact to touch and monofilament testing bilaterally: Yes Pulse Check Posterior Tibialis and Dorsalis pulse intact bilaterally: Yes Comments        Latest Ref Rng & Units 12/19/2021    4:28 PM 09/20/2021    3:10 PM 06/01/2021    2:13 AM  CMP  Glucose 70 - 99 mg/dL 80  74  108   BUN 8 - 27 mg/dL 7  8  8   $ Creatinine 0.57 - 1.00 mg/dL 0.56  0.55  0.48   Sodium 134 - 144 mmol/L 142  144  139   Potassium 3.5 - 5.2 mmol/L  4.5  4.3  3.6   Chloride 96 - 106 mmol/L 104  104  106   CO2 20 - 29 mmol/L 25  25  24  Calcium 8.7 - 10.3 mg/dL 9.7  9.3  8.8   Total Protein 6.0 - 8.5 g/dL 7.0  7.4    Total Bilirubin 0.0 - 1.2 mg/dL 1.0  1.1    Alkaline Phos 44 - 121 IU/L 103  120    AST 0 - 40 IU/L 20  21    ALT 0 - 32 IU/L 14  21      Lipid Panel     Component Value Date/Time   CHOL 144 12/19/2021 1628   TRIG 80 12/19/2021 1628   HDL 52 12/19/2021 1628   CHOLHDL 3.2 10/03/2020 1639   CHOLHDL 5.2 (H) 02/06/2016 1557   VLDL 42 (H) 02/06/2016 1557   LDLCALC 77 12/19/2021 1628    CBC    Component Value Date/Time   WBC 10.3 06/01/2021 0213   RBC 4.33 06/01/2021 0213   HGB 13.2 06/01/2021 0213   HCT 39.9 06/01/2021 0213   PLT 278 06/01/2021 0213   MCV 92.1 06/01/2021 0213   MCH 30.5 06/01/2021 0213   MCHC 33.1 06/01/2021 0213   RDW 13.1 06/01/2021 0213   LYMPHSABS 3.2 05/31/2021 1735   MONOABS 0.5 05/31/2021 1735   EOSABS 0.3 05/31/2021 1735   BASOSABS 0.0 05/31/2021 1735    Lab Results  Component Value Date   HGBA1C 7.6 (A) 10/09/2022    Assessment & Plan:  1. Type 2 diabetes mellitus with hyperglycemia, without long-term current use of insulin (HCC) Improved with A1c of 7.6 down from 9.8 Continue current regimen Counseled on Diabetic diet, my plate method, [id number] minutes of moderate intensity exercise/week Blood sugar logs with fasting goals of 80-120 mg/dl, random of less than 180 and in the event of sugars less than 60 mg/dl or greater than 400 mg/dl encouraged to notify the clinic. Advised on the need for annual eye exams, annual foot exams, Pneumonia vaccine. - POCT glucose (manual entry) - POCT glycosylated hemoglobin (Hb A1C) - CMP14+EGFR - LP+Non-HDL Cholesterol - Microalbumin/Creatinine Ratio, Urine  2. 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  Dispense: 60 tablet; Refill: 1  3. Plantar fasciitis of left foot Demonstrated stretching exercises Advised to use insoles Will place on NSAID and if symptoms persist consider referral to podiatry for foot injection. - naproxen (NAPROSYN) 500 MG tablet; Take 1 tablet (500 mg total) by mouth 2 (two) times daily with a meal.  Dispense: 60 tablet; Refill: 1    Meds ordered this encounter  Medications   tiZANidine (ZANAFLEX) 4 MG tablet    Sig: Take 1 tablet (4 mg total) by mouth every 8 (eight) hours as needed.    Dispense:  60 tablet    Refill:  1   naproxen (NAPROSYN) 500 MG tablet    Sig: Take 1 tablet (500 mg total) by mouth 2 (two) times daily with a meal.    Dispense:  60 tablet    Refill:  1    Follow-up: Return in about 3 months (around 01/07/2023) for Chronic medical conditions.       Charlott Rakes, MD, FAAFP. [facility] and [place], [place]   10/09/2022, 3:24 PM

## 2022-10-10 LAB — CMP14+EGFR
ALT: 21 IU/L (ref 0–32)
AST: 29 IU/L (ref 0–40)
Albumin/Globulin Ratio: 2 (ref 1.2–2.2)
Albumin: 4.4 g/dL (ref 3.9–4.9)
Alkaline Phosphatase: 152 IU/L — ABNORMAL HIGH (ref 44–121)
BUN/Creatinine Ratio: 15 (ref 12–28)
BUN: 7 mg/dL — ABNORMAL LOW (ref 8–27)
Bilirubin Total: 2.1 mg/dL — ABNORMAL HIGH (ref 0.0–1.2)
CO2: 22 mmol/L (ref 20–29)
Calcium: 9 mg/dL (ref 8.7–10.3)
Chloride: 105 mmol/L (ref 96–106)
Creatinine, Ser: 0.46 mg/dL — ABNORMAL LOW (ref 0.57–1.00)
Globulin, Total: 2.2 g/dL (ref 1.5–4.5)
Glucose: 73 mg/dL (ref 70–99)
Potassium: 3.8 mmol/L (ref 3.5–5.2)
Sodium: 142 mmol/L (ref 134–144)
Total Protein: 6.6 g/dL (ref 6.0–8.5)
eGFR: 107 mL/min/{1.73_m2} (ref >59)

## 2022-10-10 LAB — LP+NON-HDL CHOLESTEROL
Cholesterol, Total: 138 mg/dL (ref 100–199)
HDL: 51 mg/dL (ref >39)
LDL Chol Calc (NIH): 74 mg/dL (ref 0–99)
Total Non-HDL-Chol (LDL+VLDL): 87 mg/dL (ref 0–129)
Triglycerides: 60 mg/dL (ref 0–149)
VLDL Cholesterol Cal: 13 mg/dL (ref 5–40)

## 2022-10-10 LAB — MICROALBUMIN / CREATININE URINE RATIO
Creatinine, Urine: 136.9 mg/dL
Microalb/Creat Ratio: 7 mg/g creat (ref 0–29)
Microalbumin, Urine: 9.4 ug/mL

## 2022-10-11 ENCOUNTER — Other Ambulatory Visit: Payer: Self-pay | Admitting: Family Medicine

## 2022-10-11 DIAGNOSIS — R7989 Other specified abnormal findings of blood chemistry: Secondary | ICD-10-CM

## 2022-11-13 ENCOUNTER — Ambulatory Visit: Payer: Self-pay | Attending: Family Medicine

## 2022-11-13 DIAGNOSIS — R7989 Other specified abnormal findings of blood chemistry: Secondary | ICD-10-CM

## 2022-11-14 LAB — CMP14+EGFR
ALT: 22 IU/L (ref 0–32)
AST: 32 IU/L (ref 0–40)
Albumin/Globulin Ratio: 1.8 (ref 1.2–2.2)
Albumin: 4.1 g/dL (ref 3.9–4.9)
Alkaline Phosphatase: 140 IU/L — ABNORMAL HIGH (ref 44–121)
BUN/Creatinine Ratio: 18 (ref 12–28)
BUN: 10 mg/dL (ref 8–27)
Bilirubin Total: 0.9 mg/dL (ref 0.0–1.2)
CO2: 22 mmol/L (ref 20–29)
Calcium: 9 mg/dL (ref 8.7–10.3)
Chloride: 103 mmol/L (ref 96–106)
Creatinine, Ser: 0.57 mg/dL (ref 0.57–1.00)
Globulin, Total: 2.3 g/dL (ref 1.5–4.5)
Glucose: 175 mg/dL — ABNORMAL HIGH (ref 70–99)
Potassium: 4.1 mmol/L (ref 3.5–5.2)
Sodium: 139 mmol/L (ref 134–144)
Total Protein: 6.4 g/dL (ref 6.0–8.5)
eGFR: 101 mL/min/{1.73_m2} (ref >59)

## 2022-12-25 ENCOUNTER — Other Ambulatory Visit: Payer: Self-pay

## 2022-12-25 ENCOUNTER — Other Ambulatory Visit: Payer: Self-pay | Admitting: Pharmacist

## 2022-12-25 ENCOUNTER — Other Ambulatory Visit: Payer: Self-pay | Admitting: Family Medicine

## 2022-12-25 DIAGNOSIS — E1165 Type 2 diabetes mellitus with hyperglycemia: Secondary | ICD-10-CM

## 2022-12-25 DIAGNOSIS — G8929 Other chronic pain: Secondary | ICD-10-CM

## 2022-12-25 MED ORDER — BASAGLAR KWIKPEN 100 UNIT/ML ~~LOC~~ SOPN
32.0000 [IU] | PEN_INJECTOR | Freq: Every day | SUBCUTANEOUS | 2 refills | Status: DC
Start: 2022-12-25 — End: 2023-01-07
  Filled 2022-12-25: qty 9, 28d supply, fill #0

## 2022-12-25 MED ORDER — METFORMIN HCL 1000 MG PO TABS
1000.0000 mg | ORAL_TABLET | Freq: Two times a day (BID) | ORAL | 0 refills | Status: DC
  Filled 2022-12-25: qty 60, 30d supply, fill #0

## 2022-12-25 MED ORDER — MELOXICAM 7.5 MG PO TABS
7.5000 mg | ORAL_TABLET | Freq: Every day | ORAL | 0 refills | Status: DC | PRN
Start: 2022-12-25 — End: 2023-01-07
  Filled 2022-12-25: qty 90, 90d supply, fill #0

## 2022-12-25 NOTE — Telephone Encounter (Signed)
Requested medications are due for refill today.  yes  Requested medications are on the active medications list.  yes  Last refill. 06/25/2022 #180 1 rf  Future visit scheduled.   yes  Notes to clinic.  Labs are expired.    Requested Prescriptions  Pending Prescriptions Disp Refills   metFORMIN (GLUCOPHAGE) 1000 MG tablet 180 tablet 1    Sig: Take 1 tablet (1,000 mg total) by mouth 2 (two) times daily with a meal.     Endocrinology:  Diabetes - Biguanides Failed - 12/25/2022  2:43 PM      Failed - B12 Level in normal range and within 720 days    No results found for: "VITAMINB12"       Failed - CBC within normal limits and completed in the last 12 months    WBC  Date Value Ref Range Status  06/01/2021 10.3 4.0 - 10.5 K/uL Final   RBC  Date Value Ref Range Status  06/01/2021 4.33 3.87 - 5.11 MIL/uL Final   Hemoglobin  Date Value Ref Range Status  06/01/2021 13.2 12.0 - 15.0 g/dL Final   HCT  Date Value Ref Range Status  06/01/2021 39.9 36.0 - 46.0 % Final   MCHC  Date Value Ref Range Status  06/01/2021 33.1 30.0 - 36.0 g/dL Final   [facility]  Date Value Ref Range Status  06/01/2021 30.5 26.0 - 34.0 pg Final   MCV  Date Value Ref Range Status  06/01/2021 92.1 80.0 - 100.0 fL Final   No results found for: "PLTCOUNTKUC", "LABPLAT", "POCPLA" RDW  Date Value Ref Range Status  06/01/2021 13.1 11.5 - 15.5 % Final         Passed - Cr in normal range and within 360 days    Creat  Date Value Ref Range Status  02/06/2016 0.48 (L) 0.50 - 1.05 mg/dL Final    Comment:      For patients > or = 65 years of age: The upper reference limit for Creatinine is approximately 13% higher for people identified as African-American.      Creatinine, Ser  Date Value Ref Range Status  11/13/2022 0.57 0.57 - 1.00 mg/dL Final   Creatinine, Urine  Date Value Ref Range Status  02/06/2016 143 20 - 320 mg/dL Final         Passed - HBA1C is between 0 and 7.9 and within 180 days     HbA1c, POC (controlled diabetic range)  Date Value Ref Range Status  10/09/2022 7.6 (A) 0.0 - 7.0 % Final         Passed - eGFR in normal range and within 360 days    GFR, Est African American  Date Value Ref Range Status  02/06/2016 >89 >=60 mL/min Final   GFR calc Af Amer  Date Value Ref Range Status  10/03/2020 112 >59 mL/min/1.73 Corrected    Comment:    **In accordance with recommendations from the NKF-ASN Task force,**   [facility] is in the process of updating its eGFR calculation to the   2021 CKD-EPI creatinine equation that estimates kidney function   without a race variable.    GFR, Est Non African American  Date Value Ref Range Status  02/06/2016 >89 >=60 mL/min Final   GFR, Estimated  Date Value Ref Range Status  06/01/2021 >60 >60 mL/min Final    Comment:    (NOTE) Calculated using the CKD-EPI Creatinine Equation (2021)    eGFR  Date Value Ref Range Status  11/13/2022  101 >59 mL/min/1.73 Final         Passed - Valid encounter within last 6 months    Recent Outpatient Visits           2 months ago Type 2 diabetes mellitus with hyperglycemia, without long-term current use of insulin (HCC)   [place] [facility] Hoy Register, MD   4 months ago Annual physical exam   [place] [facility] [place], Odette Horns, MD   6 months ago Type 2 diabetes mellitus with hyperglycemia, without long-term current use of insulin [facility]   [place] [facility] [place], Odette Horns, MD   1 year ago Type 2 diabetes mellitus with hyperglycemia, without long-term current use of insulin [facility])   [place] [facility] [place], Odette Horns, MD   1 year ago Type 2 diabetes mellitus with hyperglycemia, without long-term current use of insulin [facility])   [place] [facility] Hoy Register, MD       Future Appointments             In 1 week Hoy Register, MD [facility]  [facility]

## 2022-12-31 ENCOUNTER — Other Ambulatory Visit: Payer: Self-pay

## 2023-01-07 ENCOUNTER — Ambulatory Visit: Payer: Self-pay | Attending: Family Medicine | Admitting: Family Medicine

## 2023-01-07 ENCOUNTER — Other Ambulatory Visit: Payer: Self-pay

## 2023-01-07 ENCOUNTER — Encounter: Payer: Self-pay | Admitting: Family Medicine

## 2023-01-07 VITALS — BP 120/76 | HR 68 | Temp 98.6°F | Ht 62.0 in | Wt 167.2 lb

## 2023-01-07 DIAGNOSIS — I10 Essential (primary) hypertension: Secondary | ICD-10-CM

## 2023-01-07 DIAGNOSIS — E1165 Type 2 diabetes mellitus with hyperglycemia: Secondary | ICD-10-CM

## 2023-01-07 DIAGNOSIS — Z7984 Long term (current) use of oral hypoglycemic drugs: Secondary | ICD-10-CM

## 2023-01-07 DIAGNOSIS — M25511 Pain in right shoulder: Secondary | ICD-10-CM

## 2023-01-07 DIAGNOSIS — M25512 Pain in left shoulder: Secondary | ICD-10-CM

## 2023-01-07 DIAGNOSIS — G8929 Other chronic pain: Secondary | ICD-10-CM

## 2023-01-07 DIAGNOSIS — E785 Hyperlipidemia, unspecified: Secondary | ICD-10-CM

## 2023-01-07 DIAGNOSIS — E1169 Type 2 diabetes mellitus with other specified complication: Secondary | ICD-10-CM

## 2023-01-07 LAB — POCT GLYCOSYLATED HEMOGLOBIN (HGB A1C): HbA1c, POC (controlled diabetic range): 7.6 % — AB (ref 0.0–7.0)

## 2023-01-07 MED ORDER — MELOXICAM 7.5 MG PO TABS
7.5000 mg | ORAL_TABLET | Freq: Every day | ORAL | 1 refills | Status: DC | PRN
Start: 2023-01-07 — End: 2023-11-14
  Filled 2023-01-07: qty 90, 90d supply, fill #0

## 2023-01-07 MED ORDER — LISINOPRIL 2.5 MG PO TABS
2.5000 mg | ORAL_TABLET | Freq: Every day | ORAL | 1 refills | Status: DC
Start: 2023-01-07 — End: 2023-05-28
  Filled 2023-01-07: qty 90, 90d supply, fill #0

## 2023-01-07 MED ORDER — ATORVASTATIN CALCIUM 80 MG PO TABS
80.0000 mg | ORAL_TABLET | Freq: Every day | ORAL | 1 refills | Status: DC
Start: 2023-01-07 — End: 2023-05-28
  Filled 2023-01-07: qty 90, 90d supply, fill #0

## 2023-01-07 MED ORDER — METFORMIN HCL 1000 MG PO TABS
1000.0000 mg | ORAL_TABLET | Freq: Two times a day (BID) | ORAL | 1 refills | Status: DC
Start: 2023-01-07 — End: 2023-05-28
  Filled 2023-01-07: qty 180, 90d supply, fill #0

## 2023-01-07 MED ORDER — BASAGLAR KWIKPEN 100 UNIT/ML ~~LOC~~ SOPN
32.0000 [IU] | PEN_INJECTOR | Freq: Every day | SUBCUTANEOUS | 2 refills | Status: DC
Start: 2023-01-07 — End: 2023-02-18
  Filled 2023-01-07: qty 9, 28d supply, fill #0

## 2023-01-07 MED ORDER — GLIPIZIDE 10 MG PO TABS
10.0000 mg | ORAL_TABLET | Freq: Two times a day (BID) | ORAL | 1 refills | Status: DC
Start: 2023-01-07 — End: 2023-05-28
  Filled 2023-01-07: qty 180, 90d supply, fill #0

## 2023-01-07 MED ORDER — INSULIN PEN NEEDLE 31G X 5 MM MISC
1.0000 | Freq: Every day | 5 refills | Status: DC
Start: 2023-01-07 — End: 2024-03-05
  Filled 2023-01-07: qty 100, 100d supply, fill #0

## 2023-01-07 NOTE — Progress Notes (Signed)
Subjective:  Patient ID: Margaret Austin, female    DOB: 1955/10/26  Age: 67 y.o. MRN: [id number]  CC: Diabetes   HPI [name] is a 67 y.o. year old female with a history of Diabetes Mellitus type 2 (A1c 7.6), Hyperlipidemia, panuveitis of both eyes, left eye retinal detachment.   Interval History:   She continues to see Ophthalmology for her panuveitis. She still has blurry vision and is being worked up for cataract surgery.  A1c stable at 7.6 and she has no hypoglycemia, neuropathy and is doing well on her current regimen.  She is requesting refills of pen needles. Tolerating her statin with no complaints of myopathy.  She exercises regularly by means of walking. Past Medical History:  Diagnosis Date   Diabetes mellitus without complication (HCC)     No past surgical history on file.  Family History  Problem Relation Age of Onset   Autoimmune disease Neg Hx     Social History   Socioeconomic History   Marital status: Single    Spouse name: Not on file   Number of children: Not on file   Years of education: Not on file   Highest education level: Not on file  Occupational History   Not on file  Tobacco Use   Smoking status: Never   Smokeless tobacco: Never  Vaping Use   Vaping Use: Never used  Substance and Sexual Activity   Alcohol use: No   Drug use: No   Sexual activity: Yes    Birth control/protection: None  Other Topics Concern   Not on file  Social History Narrative   ** Merged History Encounter **       Social Determinants of Health   Financial Resource Strain: Not on file  Food Insecurity: Not on file  Transportation Needs: Not on file  Physical Activity: Not on file  Stress: Not on file  Social Connections: Not on file    No Known Allergies  Outpatient Medications Prior to Visit  Medication Sig Dispense Refill   Blood Glucose Monitoring Suppl (TRUE METRIX METER) w/Device KIT Used as directed, 3 times daily. (Patient  taking differently: Check blood sugar 1-2 times daily) 1 kit 0   fluticasone (FLONASE) 50 MCG/ACT nasal spray Place 2 sprays into both nostrils daily. 16 g 5   hydrocortisone 2.5 % cream Apply topically 2 (two) times daily. 30 g 0   naproxen (NAPROSYN) 500 MG tablet Take 1 tablet (500 mg total) by mouth 2 (two) times daily with a meal. 60 tablet 1   OVER THE COUNTER MEDICATION Take 1 tablet by mouth daily. Vitamin from sp[employer] store     tiZANidine (ZANAFLEX) 4 MG tablet Take 1 tablet (4 mg total) by mouth every 8 (eight) hours as needed. 60 tablet 1   atorvastatin (LIPITOR) 80 MG tablet Take 1 tablet (80 mg total) by mouth daily. 90 tablet 1   glipiZIDE (GLUCOTROL) 10 MG tablet Take 1 tablet (10 mg total) by mouth 2 (two) times daily. 60 tablet 6   Insulin Glargine (BASAGLAR KWIKPEN) 100 UNIT/ML Inject 32 Units into the skin at bedtime. 9 mL 2   Insulin Pen Needle 31G X 5 MM MISC Use at bedtime 100 each 5   lisinopril (ZESTRIL) 2.5 MG tablet Take 1 tablet (2.5 mg total) by mouth daily. 90 tablet 1   meloxicam (MOBIC) 7.5 MG tablet Take 1 tablet (7.5 mg total) by mouth daily as needed for pain. 90 tablet 0  metFORMIN (GLUCOPHAGE) 1000 MG tablet Take 1 tablet (1,000 mg total) by mouth 2 (two) times daily with a meal. 60 tablet 0   No facility-administered medications prior to visit.     ROS Review of Systems  Constitutional:  Negative for activity change and appetite change.  HENT:  Negative for sinus pressure and sore throat.   Respiratory:  Negative for chest tightness, shortness of breath and wheezing.   Cardiovascular:  Negative for chest pain and palpitations.  Gastrointestinal:  Negative for abdominal distention, abdominal pain and constipation.  Genitourinary: Negative.   Musculoskeletal: Negative.   Psychiatric/Behavioral:  Negative for behavioral problems and dysphoric mood.     Objective:  BP 120/76   Pulse 68   Temp 98.6 F (37 C) (Oral)   Ht 5\' 2"  (1.575 m)   Wt 167 lb  3.2 oz (75.8 kg)   SpO2 98%   BMI 30.58 kg/m      01/07/2023    2:31 PM 10/09/2022    2:41 PM 08/27/2022    2:48 PM  BP/Weight  Systolic BP 120 134 116  Diastolic BP 76 79 76  Wt. (Lbs) 167.2 165.8 166.6  BMI 30.58 kg/m2 30.33 kg/m2 30.47 kg/m2      Physical Exam Constitutional:      Appearance: She is well-developed.  Cardiovascular:     Rate and Rhythm: Normal rate.     Heart sounds: Normal heart sounds. No murmur heard. Pulmonary:     Effort: Pulmonary effort is normal.     Breath sounds: Normal breath sounds. No wheezing or rales.  Chest:     Chest wall: No tenderness.  Abdominal:     General: Bowel sounds are normal. There is no distension.     Palpations: Abdomen is soft. There is no mass.     Tenderness: There is no abdominal tenderness.  Musculoskeletal:        General: Normal range of motion.     Right lower leg: No edema.     Left lower leg: No edema.  Neurological:     Mental Status: She is alert and oriented to person, place, and time.  Psychiatric:        Mood and Affect: Mood normal.        Latest Ref Rng & Units 11/13/2022    2:59 PM 10/09/2022    3:18 PM 12/19/2021    4:28 PM  CMP  Glucose 70 - 99 mg/dL 161  73  80   BUN 8 - 27 mg/dL 10  7  7    Creatinine 0.57 - 1.00 mg/dL 0.96  0.45  4.09   Sodium 134 - 144 mmol/L 139  142  142   Potassium 3.5 - 5.2 mmol/L 4.1  3.8  4.5   Chloride 96 - 106 mmol/L 103  105  104   CO2 20 - 29 mmol/L 22  22  25    Calcium 8.7 - 10.3 mg/dL 9.0  9.0  9.7   Total Protein 6.0 - 8.5 g/dL 6.4  6.6  7.0   Total Bilirubin 0.0 - 1.2 mg/dL 0.9  2.1  1.0   Alkaline Phos 44 - 121 IU/L 140  152  103   AST 0 - 40 IU/L 32  29  20   ALT 0 - 32 IU/L 22  21  14      Lipid Panel     Component Value Date/Time   CHOL 138 10/09/2022 1518   TRIG 60 10/09/2022 1518   HDL  51 10/09/2022 1518   CHOLHDL 3.2 10/03/2020 1639   CHOLHDL 5.2 (H) 02/06/2016 1557   VLDL 42 (H) 02/06/2016 1557   LDLCALC 74 10/09/2022 1518    CBC     Component Value Date/Time   WBC 10.3 06/01/2021 0213   RBC 4.33 06/01/2021 0213   HGB 13.2 06/01/2021 0213   HCT 39.9 06/01/2021 0213   PLT 278 06/01/2021 0213   MCV 92.1 06/01/2021 0213   MCH 30.5 06/01/2021 0213   MCHC 33.1 06/01/2021 0213   RDW 13.1 06/01/2021 0213   LYMPHSABS 3.2 05/31/2021 1735   MONOABS 0.5 05/31/2021 1735   EOSABS 0.3 05/31/2021 1735   BASOSABS 0.0 05/31/2021 1735    Lab Results  Component Value Date   HGBA1C 7.6 (A) 01/07/2023    Assessment & Plan:  1. Type 2 diabetes mellitus with hyperglycemia, without long-term current use of insulin (HCC) Controlled with A1c of 7.6 Continue current regimen Counseled on Diabetic diet, my plate method, 161 minutes of moderate intensity exercise/week Blood sugar logs with fasting goals of 80-120 mg/dl, random of less than 096 and in the event of sugars less than 60 mg/dl or greater than 045 mg/dl encouraged to notify the clinic. Advised on the need for annual eye exams, annual foot exams, Pneumonia vaccine. - POCT glycosylated hemoglobin (Hb A1C) - glipiZIDE (GLUCOTROL) 10 MG tablet; Take 1 tablet (10 mg total) by mouth 2 (two) times daily.  Dispense: 180 tablet; Refill: 1 - Insulin Glargine (BASAGLAR KWIKPEN) 100 UNIT/ML; Inject 32 Units into the skin at bedtime.  Dispense: 9 mL; Refill: 2 - metFORMIN (GLUCOPHAGE) 1000 MG tablet; Take 1 tablet (1,000 mg total) by mouth 2 (two) times daily with a meal.  Dispense: 180 tablet; Refill: 1 - Insulin Pen Needle 31G X 5 MM MISC; Use at bedtime  Dispense: 100 each; Refill: 5  2. Hyperlipidemia associated with type 2 diabetes mellitus (HCC) Controlled Low-cholesterol diet Continue statin - atorvastatin (LIPITOR) 80 MG tablet; Take 1 tablet (80 mg total) by mouth daily.  Dispense: 90 tablet; Refill: 1  3. Primary hypertension Controlled - lisinopril (ZESTRIL) 2.5 MG tablet; Take 1 tablet (2.5 mg total) by mouth daily.  Dispense: 90 tablet; Refill: 1  4. Chronic pain of  both shoulders She is currently on an NSAID for shoulder and hip pain most likely osteoarthritis - meloxicam (MOBIC) 7.5 MG tablet; Take 1 tablet (7.5 mg total) by mouth daily as needed for pain.  Dispense: 90 tablet; Refill: 1    Meds ordered this encounter  Medications   atorvastatin (LIPITOR) 80 MG tablet    Sig: Take 1 tablet (80 mg total) by mouth daily.    Dispense:  90 tablet    Refill:  1   glipiZIDE (GLUCOTROL) 10 MG tablet    Sig: Take 1 tablet (10 mg total) by mouth 2 (two) times daily.    Dispense:  180 tablet    Refill:  1   Insulin Glargine (BASAGLAR KWIKPEN) 100 UNIT/ML    Sig: Inject 32 Units into the skin at bedtime.    Dispense:  9 mL    Refill:  2   lisinopril (ZESTRIL) 2.5 MG tablet    Sig: Take 1 tablet (2.5 mg total) by mouth daily.    Dispense:  90 tablet    Refill:  1   meloxicam (MOBIC) 7.5 MG tablet    Sig: Take 1 tablet (7.5 mg total) by mouth daily as needed for pain.    Dispense:  90 tablet    Refill:  1   metFORMIN (GLUCOPHAGE) 1000 MG tablet    Sig: Take 1 tablet (1,000 mg total) by mouth 2 (two) times daily with a meal.    Dispense:  180 tablet    Refill:  1   Insulin Pen Needle 31G X 5 MM MISC    Sig: Use at bedtime    Dispense:  100 each    Refill:  5    Follow-up: Return in about 3 months (around 04/09/2023) for Chronic medical conditions.       Hoy Register, MD, FAAFP. [facility] [place], [place] [phone]   01/07/2023, 2:51 PM

## 2023-01-07 NOTE — Patient Instructions (Signed)

## 2023-02-18 ENCOUNTER — Other Ambulatory Visit: Payer: Self-pay | Admitting: Family Medicine

## 2023-02-18 DIAGNOSIS — E1165 Type 2 diabetes mellitus with hyperglycemia: Secondary | ICD-10-CM

## 2023-02-18 NOTE — Telephone Encounter (Signed)
Medication Refill - Medication: Insulin Glargine (BASAGLAR KWIKPEN) 100 UNIT/ML [[id number]]   Has the patient contacted their pharmacy? Yes.     (Agent: If yes, when and what did the pharmacy advise?) Contact PCP   Preferred Pharmacy (with phone number or street name): [facility]   Has the patient been seen for an appointment in the last year OR does the patient have an upcoming appointment? Yes.    Agent: Please be advised that RX refills may take up to 3 business days. We ask that you follow-up with your pharmacy.

## 2023-02-19 MED ORDER — BASAGLAR KWIKPEN 100 UNIT/ML ~~LOC~~ SOPN
32.0000 [IU] | PEN_INJECTOR | Freq: Every day | SUBCUTANEOUS | 2 refills | Status: DC
Start: 2023-02-19 — End: 2023-05-28
  Filled 2023-02-19 – 2023-04-16 (×2): qty 9, 28d supply, fill #0

## 2023-02-19 NOTE — Telephone Encounter (Signed)
Requested medication (s) are due for refill today: requesting another refill  Requested medication (s) are on the active medication list: yes   Last refill:  01/07/23 #9 ml 2 refills   Future visit scheduled: no   Notes to clinic:  do you want to refill Rx again? Patient requesting refill     Requested Prescriptions  Pending Prescriptions Disp Refills   Insulin Glargine (BASAGLAR KWIKPEN) 100 UNIT/ML 9 mL 2    Sig: Inject 32 Units into the skin at bedtime.     Endocrinology:  Diabetes - Insulins Passed - 02/18/2023  5:59 PM      Passed - HBA1C is between 0 and 7.9 and within 180 days    HbA1c, POC (controlled diabetic range)  Date Value Ref Range Status  01/07/2023 7.6 (A) 0.0 - 7.0 % Final         Passed - Valid encounter within last 6 months    Recent Outpatient Visits           1 month ago Type 2 diabetes mellitus with hyperglycemia, without long-term current use of insulin (HCC)   [place] [facility] [place], [place], MD   4 months ago Type 2 diabetes mellitus with hyperglycemia, without long-term current use of insulin [facility])   [place] [facility] Hoy Register, MD   5 months ago Annual physical exam   [place] [facility] [place], Odette Horns, MD   7 months ago Type 2 diabetes mellitus with hyperglycemia, without long-term current use of insulin [facility])   [place] [facility], Odette Horns, MD   1 year ago Type 2 diabetes mellitus with hyperglycemia, without long-term current use of insulin [facility])   [place] [facility] Hoy Register, MD

## 2023-02-20 ENCOUNTER — Other Ambulatory Visit: Payer: Self-pay

## 2023-02-26 ENCOUNTER — Other Ambulatory Visit: Payer: Self-pay

## 2023-04-16 ENCOUNTER — Other Ambulatory Visit (HOSPITAL_COMMUNITY): Payer: Self-pay

## 2023-04-16 ENCOUNTER — Other Ambulatory Visit: Payer: Self-pay

## 2023-05-28 ENCOUNTER — Encounter: Payer: Self-pay | Admitting: Family Medicine

## 2023-05-28 ENCOUNTER — Ambulatory Visit: Payer: Self-pay | Attending: Family Medicine | Admitting: Family Medicine

## 2023-05-28 ENCOUNTER — Other Ambulatory Visit: Payer: Self-pay

## 2023-05-28 VITALS — BP 134/76 | HR 69 | Ht 62.0 in | Wt 166.2 lb

## 2023-05-28 DIAGNOSIS — Z23 Encounter for immunization: Secondary | ICD-10-CM

## 2023-05-28 DIAGNOSIS — Z794 Long term (current) use of insulin: Secondary | ICD-10-CM

## 2023-05-28 DIAGNOSIS — Z7984 Long term (current) use of oral hypoglycemic drugs: Secondary | ICD-10-CM

## 2023-05-28 DIAGNOSIS — E785 Hyperlipidemia, unspecified: Secondary | ICD-10-CM

## 2023-05-28 DIAGNOSIS — E1165 Type 2 diabetes mellitus with hyperglycemia: Secondary | ICD-10-CM

## 2023-05-28 DIAGNOSIS — E1169 Type 2 diabetes mellitus with other specified complication: Secondary | ICD-10-CM

## 2023-05-28 DIAGNOSIS — Z7985 Long-term (current) use of injectable non-insulin antidiabetic drugs: Secondary | ICD-10-CM

## 2023-05-28 DIAGNOSIS — I10 Essential (primary) hypertension: Secondary | ICD-10-CM

## 2023-05-28 LAB — POCT GLYCOSYLATED HEMOGLOBIN (HGB A1C): HbA1c, POC (controlled diabetic range): 10.3 % — AB (ref 0.0–7.0)

## 2023-05-28 MED ORDER — BASAGLAR KWIKPEN 100 UNIT/ML ~~LOC~~ SOPN
35.0000 [IU] | PEN_INJECTOR | Freq: Every day | SUBCUTANEOUS | 6 refills | Status: DC
Start: 2023-05-28 — End: 2023-11-14
  Filled 2023-05-28: qty 9, 25d supply, fill #0

## 2023-05-28 MED ORDER — GLIPIZIDE 10 MG PO TABS
10.0000 mg | ORAL_TABLET | Freq: Two times a day (BID) | ORAL | 1 refills | Status: AC
Start: 2023-05-28 — End: ?
  Filled 2023-05-28: qty 180, 90d supply, fill #0

## 2023-05-28 MED ORDER — ATORVASTATIN CALCIUM 80 MG PO TABS
80.0000 mg | ORAL_TABLET | Freq: Every day | ORAL | 1 refills | Status: DC
Start: 2023-05-28 — End: 2023-11-20
  Filled 2023-05-28: qty 90, 90d supply, fill #0

## 2023-05-28 MED ORDER — OZEMPIC (0.25 OR 0.5 MG/DOSE) 2 MG/3ML ~~LOC~~ SOPN
0.2500 mg | PEN_INJECTOR | SUBCUTANEOUS | 3 refills | Status: DC
  Filled 2023-05-28: qty 2, 70d supply, fill #0
  Filled 2024-02-03: qty 3, 56d supply, fill #0

## 2023-05-28 MED ORDER — LISINOPRIL 2.5 MG PO TABS
2.5000 mg | ORAL_TABLET | Freq: Every day | ORAL | 1 refills | Status: AC
Start: 2023-05-28 — End: ?
  Filled 2023-05-28: qty 90, 90d supply, fill #0

## 2023-05-28 MED ORDER — METFORMIN HCL 1000 MG PO TABS
1000.0000 mg | ORAL_TABLET | Freq: Two times a day (BID) | ORAL | 1 refills | Status: DC
Start: 2023-05-28 — End: 2023-11-20
  Filled 2023-05-28: qty 180, 90d supply, fill #0

## 2023-05-28 NOTE — Patient Instructions (Addendum)
Semaglutide Injection Qu es este medicamento? La SEMAGLUTIDA trata la diabetes tipo 2. Acta [employer] niveles de insulina en el cuerpo, lo cual disminuye el azcar en la sangre (glucosa).   Tambin reduce la cantidad de azcar liberada en la sangre y desacelera la digestin. Tambin podra usarse para disminuir el riesgo de ataque cardaco y accidente cerebrovascular en personas con diabetes tipo 2. Con frecuencia, este medicamento se combina con cambios en la dieta y el ejercicio. Este medicamento puede ser utilizado para otros usos; si tiene alguna pregunta consulte con su proveedor de atencin mdica o con su farmacutico. MARCAS COMUNES: OZEMPIC Qu le debo informar a mi profesional de la salud antes de tomar este medicamento? Necesitan saber si usted presenta alguno de los siguientes problemas o situaciones: Tumores endocrinos (neoplasia endcrina mltiple tipo II) o si alguien en su familiar tuvo esos tumores Enfermedad ocular, problemas de la visin Antecedentes de pancreatitis Enfermedad renal Problemas estomacales Cncer de tiroides o si alguien en su familia tuvo cncer de tiroides Runner, broadcasting/film/video o inusual a la semaglutida, a otros medicamentos, alimentos, colorantes o conservantes Si est embarazada o buscando quedar embarazada Si est amamantando a un beb Cmo debo utilizar este medicamento? Este medicamento se debe inyectar debajo de la piel de la parte superior de la pierna (muslo), del rea del estmago o de la parte superior del brazo. Se administra una vez por semana (cada 7 das). Le ensearn cmo preparar y Engineer, materials. Use el medicamento exactamente como se le indique. Use su medicamento a intervalos regulares. No lo use con una frecuencia mayor a la indicada. Si [place] este medicamento con insulina, debe [employer] medicamento y la insulina por separado. No los mezcle. No se aplique una inyeccin al lado de la otra. Cambie (rote) los sitios  de inyeccin con cada inyeccin. Es importante que deseche las agujas y las jeringas usadas en un recipiente resistente a los pinchazos. No las deseche en la basura. Si no tiene un recipiente resistente a los pinchazos, llame a su farmacutico o a su equipo de atencin para obtenerlo. Su farmacutico le dar una Gua del medicamento especial (MedGuide, nombre en ingls) con cada receta y en cada ocasin que la vuelva a surtir. Asegrese de leer esta informacin cada vez cuidadosamente. Este medicamento viene con INSTRUCCIONES DE USO. Pdale a su farmacutico que le indique cmo usar [employer]. Lea la informacin atentamente. Hable con su farmacutico o su equipo de atencin si tiene [place] pregunta. Hable con su equipo de atencin sobre el uso de este medicamento en nios. Puede requerir atencin especial. Sobredosis: Pngase en contacto inmediatamente con un centro toxicolgico o una sala de urgencia si usted cree que haya tomado demasiado medicamento.<br>ATENCIN: [employer] es solo para usted. No comparta este medicamento con nadie. Qu sucede si me olvido de una dosis? Si se olvida una dosis, tmela lo antes posible, si es dentro de los [address]  [address] despus de la fecha en que deba tomarla. Luego tome la prxima dosis en el horario semanal habitual. Si han pasado ms de 5 das despus de [employer] dosis, no tome la dosis que se olvid. Administre la prxima dosis a la hora habitual. No se administre dosis adicionales o dobles. Si tiene preguntas sobre una dosis que se olvid, contacte a su equipo de atencin para que le brinde asesoramiento. Qu puede interactuar con este medicamento? Otros medicamentos para la diabetes Muchos medicamentos pueden causar Probation officer de azcar en la sangre. Estos  incluyen: Bebidas con alcohol Medicamentos antivirales para el VIH o SIDA Aspirina y otros medicamentos tipo aspirina Ciertos medicamentos para la presin arterial, enfermedad cardiaca y  frecuencia cardiaca irregular Cromo Diurticos Hormonas femeninas, tales como estrgenos o progestinas, pldoras anticonceptivas Fenofibrato Gemfibrozil Isoniazida Lanreotida Hormonas masculinas o esteroides anablicos IMAO, tales como Porter, Eldepryl, Marplan, Nardil y Parnate Medicamentos para bajar de peso Medicamentos para alergias, asma, resfriados o tos Medicamentos para depresin, ansiedad o trastornos psicticos Niacina Nicotina AINE, medicamentos para Chief Technology Officer y la inflamacin, tales como ibuprofeno o naproxeno Octreotida Pasireotida Pentamidina Fenitona Probenecid Antibiticos del grupo de las quinolonas, tales como ciprofloxacino, levofloxacino y ofloxacino Algunos suplementos dietticos a base de Insurance claims handler esteroideos, tales como la prednisona o la cortisona Sulfametoxazol; trimetoprima Hormonas tiroideas Algunos medicamentos pueden ocultar los sntomas de advertencia de niveles bajos de azcar en la sangre (hipoglucemia). Es posible que deba monitorear ms atentamente su nivel de azcar en la sangre si est tomando uno de estos medicamentos. Estos medicamentos incluyen: Betabloqueadores, que con frecuencia se usan para la presin arterial alta o problemas cardiacos (algunos ejemplos son atenolol, metoprolol y propranolol) Clonidina Guanetidina Reserpina Puede ser que esta lista no menciona todas las posibles interacciones. Informe a su profesional de [employer] de [employer] productos a base de hierbas, medicamentos de [place] o suplementos nutritivos que est tomando. Si usted fuma, consume bebidas alcohlicas o si utiliza drogas ilegales, indqueselo tambin a su profesional de [employer]. Algunas sustancias pueden interactuar con su medicamento. A qu debo estar atento al usar [employer]? Visite a su equipo de atencin para que revise su evolucin peridicamente. Beba abundante cantidad de lquidos mientras [place] este medicamento. Consulte con su  equipo de atencin si tiene un ataque de diarrea grave, nuseas y vmitos. La prdida de demasiado lquido corporal puede hacer que sea peligroso usar [employer]. Se monitorizarn los resultados de una prueba llamada Hemoglobina A1C (o A1C). Es un anlisis de sangre sencillo. Mide su control del nivel de azcar en la sangre durante los ltimos 2 a 3 meses. Se le realizar esta prueba cada 3 a 6 meses. Aprenda a revisar su nivel de azcar en la sangre. Conozca los sntomas del nivel bajo y alto de International aid/development worker en la sangre, y cmo controlarlos. Siempre lleve con usted una fuente rpida de azcar por si tiene sntomas de nivel bajo de azcar [employer]. Algunos ejemplos incluyen caramelos duros de azcar o tabletas de glucosa. Asegrese de que otras personas sepan que usted se puede ahogar si come o bebe cuando presenta sntomas graves de [place] bajo de azcar en la sangre, como convulsiones o prdida del conocimiento. Deben obtener ayuda mdica de inmediato. Informe a su equipo de atencin si tiene [employer] de Banker. Es posible que deba cambiar la dosis de su medicamento. Si est enfermo o hace ms ejercicio que lo habitual, es posible que necesite cambiar la dosis de su medicamento. No saltee comidas. Pregunte a su equipo de atencin si debe evitar el alcohol. Muchos productos de venta libre para la tos y el resfriado contienen azcar o alcohol. Estos pueden afectar los niveles de Banker. Los inyectores nunca deben compartirse. Incluso si se cambia la aguja, al compartir se pueden contagiar virus como la hepatitis o el VIH. Use un brazalete o una cadena de identificacin mdica, y lleve con usted una tarjeta que describa su enfermedad, detalles de su medicamento y horario de dosis. Qu efectos secundarios puedo tener al  utilizar este medicamento? Efectos secundarios que debe informar a su equipo de atencin tan pronto como sea posible: Reacciones alrgicas: erupcin  cutnea, comezn/picazn, urticaria, hinchazn de la cara, los labios, la lengua o la garganta Cambio en la visin Deshidratacin: aumento de la sed, boca seca, sensacin de desmayo o aturdimiento, dolor de [place], Manufacturing systems engineer oscura o marrn Problemas en la vescula biliar: dolor de estmago intenso, nuseas, vmitos, fiebre Palpitaciones cardacas: frecuencia cardiaca rpida, intensa o irregular Lesin en los riones: disminucin en la cantidad de orina, hinchazn de los tobillos, las manos o los pies Pancreatitis: dolor abdominal intenso que se extiende a la espalda o empeora despus de comer o al tacto, fiebre, nuseas, vmitos Ideas suicidas o de autolesionarse, empeoramiento del [place] de nimo, sentimientos de depresin Cncer de tiroides: masa o bulto nuevo en el cuello, dolor o problemas para tragar, problemas respiratorios, ronquera Efectos secundarios que generalmente no requieren atencin mdica (informe a su equipo de atencin si persisten o si son molestos): Diarrea Prdida del apetito Programme researcher, broadcasting/film/video Puede ser que esta lista no menciona todos los posibles efectos secundarios. Comunquese a su mdico por asesoramiento mdico [employer]. Usted puede informar los efectos secundarios a la FDA por telfono al 1-800-FDA-1088. Dnde debo guardar mi medicina? Mantenga fuera del alcance de los nios. Guarde los inyectores sin abrir en el refrigerador a una temperatura de [place] 2 y 8 grados Celsius (entre 36 y 46 grados Fahrenheit). No congele. Proteja de la luz y del Airline pilot. Despus de Higher education careers adviser por primera vez, se puede almacenar por [address] a temperatura ambiente entre 15 y 30 grados Celsius (59 y 84 grados Fahrenheit) o Airline pilot. Deseche el inyector usado [address] despus del primer uso o despus de la fecha de vencimiento, lo que suceda primero. No almacene el inyector con la aguja puesta. Si se deja la aguja puesta, es posible que se  escape medicamento del Hydrographic surveyor. <b>ATENCIN: Este folleto es un resumen. Puede ser que no cubra toda la posible informacin. Si usted tiene preguntas acerca de esta medicina, consulte con su mdico, su farmacutico o su profesional de Radiographer, therapeutic.</b>  2024 Elsevier/Gold Standard (2022-12-31 00:00:00)

## 2023-05-28 NOTE — Progress Notes (Signed)
Subjective:  Patient ID: Margaret Austin, female    DOB: 07-Sep-1955  Age: 67 y.o. MRN: [id number]  CC: Medical Management of Chronic Issues (Recent nose bleed/)   HPI Margaret Austin is a 67 y.o. year old female with a history of Diabetes Mellitus type 2 (A1c 10.3), Hyperlipidemia, panuveitis of both eyes, left eye retinal detachment.   Interval History: Discussed the use of AI scribe software for clinical note transcription with the patient, who gave verbal consent to proceed.   The patient, with a history of diabetes, presents for a routine follow-up. She reports compliance with her current regimen of insulin (32 units at night), glipizide, and metformin. She also takes atorvastatin for cholesterol management. She reports that her blood sugar levels at home range from 96 to 110 when fasting and increase to 125 to 130 postprandially. Despite this, her recent A1c was elevated at 10.3 up from 7.3. She denies any recent changes in diet, specifically denying increased intake of rice or tortillas, and reports primarily consuming vegetables.     She is adherent with her statin and antihypertensive.   Past Medical History:  Diagnosis Date   Diabetes mellitus without complication (HCC)     No past surgical history on file.  Family History  Problem Relation Age of Onset   Autoimmune disease Neg Hx     Social History   Socioeconomic History   Marital status: Single    Spouse name: Not on file   Number of children: Not on file   Years of education: Not on file   Highest education level: Not on file  Occupational History   Not on file  Tobacco Use   Smoking status: Never   Smokeless tobacco: Never  Vaping Use   Vaping status: Never Used  Substance and Sexual Activity   Alcohol use: No   Drug use: No   Sexual activity: Yes    Birth control/protection: None  Other Topics Concern   Not on file  Social History Narrative   ** Merged History Encounter **        Social Determinants of Health   Financial Resource Strain: Not on file  Food Insecurity: Not on file  Transportation Needs: Not on file  Physical Activity: Not on file  Stress: Not on file  Social Connections: Not on file    No Known Allergies  Outpatient Medications Prior to Visit  Medication Sig Dispense Refill   Blood Glucose Monitoring Suppl (TRUE METRIX METER) w/Device KIT Used as directed, 3 times daily. (Patient taking differently: Check blood sugar 1-2 times daily) 1 kit 0   fluticasone (FLONASE) 50 MCG/ACT nasal spray Place 2 sprays into both nostrils daily. 16 g 5   hydrocortisone 2.5 % cream Apply topically 2 (two) times daily. 30 g 0   Insulin Pen Needle 31G X 5 MM MISC Use at bedtime 100 each 5   meloxicam (MOBIC) 7.5 MG tablet Take 1 tablet (7.5 mg total) by mouth daily as needed for pain. 90 tablet 1   OVER THE COUNTER MEDICATION Take 1 tablet by mouth daily. Vitamin from spanish store     tiZANidine (ZANAFLEX) 4 MG tablet Take 1 tablet (4 mg total) by mouth every 8 (eight) hours as needed. 60 tablet 1   atorvastatin (LIPITOR) 80 MG tablet Take 1 tablet (80 mg total) by mouth daily. 90 tablet 1   glipiZIDE (GLUCOTROL) 10 MG tablet Take 1 tablet (10 mg total) by mouth 2 (two) times daily. 180  tablet 1   Insulin Glargine (BASAGLAR KWIKPEN) 100 UNIT/ML Inject 32 Units into the skin at bedtime. 9 mL 2   lisinopril (ZESTRIL) 2.5 MG tablet Take 1 tablet (2.5 mg total) by mouth daily. 90 tablet 1   metFORMIN (GLUCOPHAGE) 1000 MG tablet Take 1 tablet (1,000 mg total) by mouth 2 (two) times daily with a meal. 180 tablet 1   No facility-administered medications prior to visit.     ROS Review of Systems  Constitutional:  Negative for activity change and appetite change.  HENT:  Negative for sinus pressure and sore throat.   Respiratory:  Negative for chest tightness, shortness of breath and wheezing.   Cardiovascular:  Negative for chest pain and palpitations.   Gastrointestinal:  Negative for abdominal distention, abdominal pain and constipation.  Genitourinary: Negative.   Musculoskeletal: Negative.   Psychiatric/Behavioral:  Negative for behavioral problems and dysphoric mood.     Objective:  BP 134/76   Pulse 69   Ht 5\' 2"  (1.575 m)   Wt 166 lb 3.2 oz (75.4 kg)   SpO2 97%   BMI 30.40 kg/m      05/28/2023    2:05 PM 05/28/2023    1:48 PM 01/07/2023    2:31 PM  BP/Weight  Systolic BP 134 163 120  Diastolic BP 76 80 76  Wt. (Lbs)  166.2 167.2  BMI  30.4 kg/m2 30.58 kg/m2      Physical Exam Constitutional:      Appearance: She is well-developed.  Cardiovascular:     Rate and Rhythm: Normal rate.     Heart sounds: Normal heart sounds. No murmur heard. Pulmonary:     Effort: Pulmonary effort is normal.     Breath sounds: Normal breath sounds. No wheezing or rales.  Chest:     Chest wall: No tenderness.  Abdominal:     General: Bowel sounds are normal. There is no distension.     Palpations: Abdomen is soft. There is no mass.     Tenderness: There is no abdominal tenderness.  Musculoskeletal:        General: Normal range of motion.     Right lower leg: No edema.     Left lower leg: No edema.  Neurological:     Mental Status: She is alert and oriented to person, place, and time.  Psychiatric:        Mood and Affect: Mood normal.        Latest Ref Rng & Units 11/13/2022    2:59 PM 10/09/2022    3:18 PM 12/19/2021    4:28 PM  CMP  Glucose 70 - 99 mg/dL 161  73  80   BUN 8 - 27 mg/dL 10  7  7    Creatinine 0.57 - 1.00 mg/dL 0.96  0.45  4.09   Sodium 134 - 144 mmol/L 139  142  142   Potassium 3.5 - 5.2 mmol/L 4.1  3.8  4.5   Chloride 96 - 106 mmol/L 103  105  104   CO2 20 - 29 mmol/L 22  22  25    Calcium 8.7 - 10.3 mg/dL 9.0  9.0  9.7   Total Protein 6.0 - 8.5 g/dL 6.4  6.6  7.0   Total Bilirubin 0.0 - 1.2 mg/dL 0.9  2.1  1.0   Alkaline Phos 44 - 121 IU/L 140  152  103   AST 0 - 40 IU/L 32  29  20   ALT 0 - 32 IU/L  22  21  14      Lipid Panel     Component Value Date/Time   CHOL 138 10/09/2022 1518   TRIG 60 10/09/2022 1518   HDL 51 10/09/2022 1518   CHOLHDL 3.2 10/03/2020 1639   CHOLHDL 5.2 (H) 02/06/2016 1557   VLDL 42 (H) 02/06/2016 1557   LDLCALC 74 10/09/2022 1518    CBC    Component Value Date/Time   WBC 10.3 06/01/2021 0213   RBC 4.33 06/01/2021 0213   HGB 13.2 06/01/2021 0213   HCT 39.9 06/01/2021 0213   PLT 278 06/01/2021 0213   MCV 92.1 06/01/2021 0213   MCH 30.5 06/01/2021 0213   MCHC 33.1 06/01/2021 0213   RDW 13.1 06/01/2021 0213   LYMPHSABS 3.2 05/31/2021 1735   MONOABS 0.5 05/31/2021 1735   EOSABS 0.3 05/31/2021 1735   BASOSABS 0.0 05/31/2021 1735    Lab Results  Component Value Date   HGBA1C 10.3 (A) 05/28/2023    Assessment & Plan:      Type 2 Diabetes Mellitus Elevated HbA1c (10.3) despite adherence to insulin, metformin, and glipizide. Patient reports increased hunger. Home glucose monitoring shows postprandial sugars of 125-130. -Increase nightly insulin dose from 32 units to 35 units. -Add Ozempic once weekly to help control appetite and improve glycemic control. -Continue metformin and glipizide as previously prescribed. -Check HbA1c in 3 months.  Hyperlipidemia Well controlled on atorvastatin. -Continue atorvastatin.  Hypertension Controlled on lisinopril -Counseled on blood pressure goal of less than 130/80, low-sodium, DASH diet, medication compliance, 150 minutes of moderate intensity exercise per week. Discussed medication compliance, adverse effects.  General Health Maintenance -Administer influenza vaccine today. -Refill all medications, including insulin, which the patient is running low on.          Meds ordered this encounter  Medications   atorvastatin (LIPITOR) 80 MG tablet    Sig: Take 1 tablet (80 mg total) by mouth daily.    Dispense:  90 tablet    Refill:  1   glipiZIDE (GLUCOTROL) 10 MG tablet    Sig: Take 1 tablet  (10 mg total) by mouth 2 (two) times daily.    Dispense:  180 tablet    Refill:  1   Insulin Glargine (BASAGLAR KWIKPEN) 100 UNIT/ML    Sig: Inject 35 Units into the skin at bedtime.    Dispense:  30 mL    Refill:  6    Dose increase   lisinopril (ZESTRIL) 2.5 MG tablet    Sig: Take 1 tablet (2.5 mg total) by mouth daily.    Dispense:  90 tablet    Refill:  1   metFORMIN (GLUCOPHAGE) 1000 MG tablet    Sig: Take 1 tablet (1,000 mg total) by mouth 2 (two) times daily with a meal.    Dispense:  180 tablet    Refill:  1   Semaglutide,0.25 or 0.5MG /DOS, (OZEMPIC, 0.25 OR 0.5 MG/DOSE,) 2 MG/3ML SOPN    Sig: Inject 0.25 mg into the skin once a week.    Dispense:  3 mL    Refill:  3    Follow-up: Return in about 3 months (around 08/28/2023) for Chronic medical conditions.       Hoy Register, MD, FAAFP. [facility] [place], [place] [phone]   05/28/2023, 2:20 PM

## 2023-05-29 LAB — BASIC METABOLIC PANEL
BUN/Creatinine Ratio: 15 (ref 12–28)
BUN: 9 mg/dL (ref 8–27)
CO2: 22 mmol/L (ref 20–29)
Calcium: 9 mg/dL (ref 8.7–10.3)
Chloride: 102 mmol/L (ref 96–106)
Creatinine, Ser: 0.62 mg/dL (ref 0.57–1.00)
Glucose: 83 mg/dL (ref 70–99)
Potassium: 4.1 mmol/L (ref 3.5–5.2)
Sodium: 142 mmol/L (ref 134–144)
eGFR: 99 mL/min/{1.73_m2} (ref >59)

## 2023-09-02 ENCOUNTER — Ambulatory Visit: Payer: Self-pay | Admitting: Family Medicine

## 2023-11-14 ENCOUNTER — Other Ambulatory Visit: Payer: Self-pay | Admitting: Family Medicine

## 2023-11-14 DIAGNOSIS — E1165 Type 2 diabetes mellitus with hyperglycemia: Secondary | ICD-10-CM

## 2023-11-14 DIAGNOSIS — G8929 Other chronic pain: Secondary | ICD-10-CM

## 2023-11-14 NOTE — Telephone Encounter (Signed)
 Copied from CRM [id number]. Topic: Clinical - Medication Refill >> Nov 14, 2023 12:45 PM DeAngela L wrote: Most Recent Primary Care Visit:  Provider: Hoy Register  Department: [facility]  Visit Type: OFFICE VISIT  Date: 05/28/2023  Medication: meloxicam (MOBIC) 7.5 MG tablet Insulin Glargine (BASAGLAR KWIKPEN) 100 UNIT/ML   Has the patient contacted their pharmacy? Yes  (Agent: If no, request that the patient contact the pharmacy for the refill. If patient does not wish to contact the pharmacy document the reason why and proceed with request.) (Agent: If yes, when and what did the pharmacy advise?)  Is this the correct pharmacy for this prescription? Yes  If no, delete pharmacy and type the correct one.  This is the patient's preferred pharmacy:  [facility] [address], [address] [place] [address] Phone: [phone] Fax: [phone]   Has the prescription been filled recently? Yes   Is the patient out of the medication? Yes   Has the patient been seen for an appointment in the last year OR does the patient have an upcoming appointment? Yes  Can we respond through MyChart? No  Agent: Please be advised that Rx refills may take up to 3 business days. We ask that you follow-up with your pharmacy.

## 2023-11-15 ENCOUNTER — Other Ambulatory Visit: Payer: Self-pay

## 2023-11-15 MED ORDER — BASAGLAR KWIKPEN 100 UNIT/ML ~~LOC~~ SOPN
35.0000 [IU] | PEN_INJECTOR | Freq: Every day | SUBCUTANEOUS | 0 refills | Status: DC
  Filled 2023-11-15: qty 9, 25d supply, fill #0

## 2023-11-15 MED ORDER — MELOXICAM 7.5 MG PO TABS
7.5000 mg | ORAL_TABLET | Freq: Every day | ORAL | 0 refills | Status: DC | PRN
Start: 2023-11-15 — End: 2023-11-20
  Filled 2023-11-15: qty 30, 30d supply, fill #0

## 2023-11-15 NOTE — Telephone Encounter (Signed)
 Requested Prescriptions  Pending Prescriptions Disp Refills   Insulin Glargine (BASAGLAR KWIKPEN) 100 UNIT/ML 15 mL 0    Sig: Inject 35 Units into the skin at bedtime.     Endocrinology:  Diabetes - Insulins Failed - 11/15/2023  3:22 PM      Failed - HBA1C is between 0 and 7.9 and within 180 days    HbA1c, POC (controlled diabetic range)  Date Value Ref Range Status  05/28/2023 10.3 (A) 0.0 - 7.0 % Final         Passed - Valid encounter within last 6 months    Recent Outpatient Visits           5 months ago Type 2 diabetes mellitus with hyperglycemia, without long-term current use of insulin (HCC)   [facility] Dept [facility] [place]. [facility] Hoy Register, MD   10 months ago Type 2 diabetes mellitus with hyperglycemia, without long-term current use of insulin (HCC)   [facility] [place] [facility] Dept Of [place]. [facility] Hoy Register, MD   1 year ago Type 2 diabetes mellitus with hyperglycemia, without long-term current use of insulin (HCC)   [facility] - [facility] Dept Of [place]. [facility] Hoy Register, MD   1 year ago Annual physical exam   [facility] - [facility] Dept Of [place]. [facility] Hoy Register, MD   1 year ago Type 2 diabetes mellitus with hyperglycemia, without long-term current use of insulin (HCC)   [place] [facility] - [facility] Dept Of [place]. [facility] Alvis Lemmings, Odette Horns, MD               meloxicam (MOBIC) 7.5 MG tablet 30 tablet 0    Sig: Take 1 tablet (7.5 mg total) by mouth daily as needed for pain.     Analgesics:  COX2 Inhibitors Failed - 11/15/2023  3:22 PM      Failed - Manual Review: Labs are only required if the patient has taken medication for more than 8 weeks.      Failed - HGB in normal range and within 360 days    Hemoglobin  Date Value Ref Range Status  06/01/2021 13.2 12.0 - 15.0 g/dL Final         Failed -  HCT in normal range and within 360 days    HCT  Date Value Ref Range Status  06/01/2021 39.9 36.0 - 46.0 % Final         Failed - AST in normal range and within 360 days    AST  Date Value Ref Range Status  11/13/2022 32 0 - 40 IU/L Final         Failed - ALT in normal range and within 360 days    ALT  Date Value Ref Range Status  11/13/2022 22 0 - 32 IU/L Final         Passed - Cr in normal range and within 360 days    Creat  Date Value Ref Range Status  02/06/2016 0.48 (L) 0.50 - 1.05 mg/dL Final    Comment:      For patients > or = 66 years of age: The upper reference limit for Creatinine is approximately 13% higher for people identified as African-American.      Creatinine, Ser  Date Value Ref Range Status  05/28/2023 0.62 0.57 - 1.00 mg/dL Final  Creatinine, Urine  Date Value Ref Range Status  02/06/2016 143 20 - 320 mg/dL Final         Passed - eGFR is 30 or above and within 360 days    GFR, Est African American  Date Value Ref Range Status  02/06/2016 >89 >=60 mL/min Final   GFR calc Af Amer  Date Value Ref Range Status  10/03/2020 112 >59 mL/min/1.73 Corrected    Comment:    **In accordance with recommendations from the NKF-ASN Task force,**   [facility] is in the process of updating its eGFR calculation to the   2021 CKD-EPI creatinine equation that estimates kidney function   without a race variable.    GFR, Est Non African American  Date Value Ref Range Status  02/06/2016 >89 >=60 mL/min Final   GFR, Estimated  Date Value Ref Range Status  06/01/2021 >60 >60 mL/min Final    Comment:    (NOTE) Calculated using the CKD-EPI Creatinine Equation (2021)    eGFR  Date Value Ref Range Status  05/28/2023 99 >59 mL/min/1.73 Final         Passed - Patient is not pregnant      Passed - Valid encounter within last 12 months    Recent Outpatient Visits           5 months ago Type 2 diabetes mellitus with hyperglycemia, without long-term current  use of insulin (HCC)   [place] [facility] Dept Of [place]. [facility] Hoy Register, MD   10 months ago Type 2 diabetes mellitus with hyperglycemia, without long-term current use of insulin (HCC)   [place] [facility] Dept Of [place]. [facility] Hoy Register, MD   1 year ago Type 2 diabetes mellitus with hyperglycemia, without long-term current use of insulin (HCC)   [facility] - [facility] Dept Of [place]. [facility] Hoy Register, MD   1 year ago Annual physical exam   [place] [facility] - [facility] Dept Of [place]. [facility] Hoy Register, MD   1 year ago Type 2 diabetes mellitus with hyperglycemia, without long-term current use of insulin (HCC)   [place] [facility] - [facility] Dept Of [place]. [facility] Hoy Register, MD

## 2023-11-20 ENCOUNTER — Other Ambulatory Visit: Payer: Self-pay | Admitting: Family Medicine

## 2023-11-20 DIAGNOSIS — E1169 Type 2 diabetes mellitus with other specified complication: Secondary | ICD-10-CM

## 2023-11-20 DIAGNOSIS — G8929 Other chronic pain: Secondary | ICD-10-CM

## 2023-11-20 DIAGNOSIS — E1165 Type 2 diabetes mellitus with hyperglycemia: Secondary | ICD-10-CM

## 2023-11-20 NOTE — Telephone Encounter (Signed)
 Copied from CRM [id number]. Topic: Clinical - Medication Refill >> Nov 20, 2023  2:38 PM Eunice Blase wrote: Most Recent Primary Care Visit:  Provider: Hoy Register  Department[facility]  Visit Type: OFFICE VISIT  Date: 05/28/2023  Medication: metFORMIN (GLUCOPHAGE) 1000 MG tablet, Insulin Glargine (BASAGLAR KWIKPEN) 100 UNIT/ML, atorvastatin (LIPITOR) 80 MG tablet, meloxicam (MOBIC) 7.5 MG tablet  Has the patient contacted their pharmacy? Yes (Agent: If no, request that the patient contact the pharmacy for the refill. If patient does not wish to contact the pharmacy document the reason why and proceed with request.) (Agent: If yes, when and what did the pharmacy advise?)Pharmacy needs approval from PCP  Is this the correct pharmacy for this prescription? Yes If no, delete pharmacy and type the correct one.  This is the patient's preferred pharmacy:  [facility] [address] [place] [address] Phone: [phone] Fax: [phone]   Has the prescription been filled recently? Yes  Is the patient out of the medication? Yes  Has the patient been seen for an appointment in the last year OR does the patient have an upcoming appointment? Yes  Can we respond through MyChart? Yes  Agent: Please be advised that Rx refills may take up to 3 business days. We ask that you follow-up with your pharmacy.

## 2023-11-21 NOTE — Telephone Encounter (Signed)
 Requested medication (s) are due for refill today: yes  Requested medication (s) are on the active medication list: yes  Last refill:  atorvastatin and metformin 05/28/23, Basaglar and meloxicam 11/13/23  Future visit scheduled: no, had appt scheduled but no show  Notes to clinic:  Unable to refill per protocol due to failed labs, no updated results.      Requested Prescriptions  Pending Prescriptions Disp Refills   atorvastatin (LIPITOR) 80 MG tablet 90 tablet 1    Sig: Take 1 tablet (80 mg total) by mouth daily.     Cardiovascular:  Antilipid - Statins Failed - 11/21/2023  5:35 PM      Failed - Lipid Panel in normal range within the last 12 months    Cholesterol, Total  Date Value Ref Range Status  10/09/2022 138 100 - 199 mg/dL Final   LDL Chol Calc (NIH)  Date Value Ref Range Status  10/09/2022 74 0 - 99 mg/dL Final   HDL  Date Value Ref Range Status  10/09/2022 51 >39 mg/dL Final   Triglycerides  Date Value Ref Range Status  10/09/2022 60 0 - 149 mg/dL Final         Passed - Patient is not pregnant      Passed - Valid encounter within last 12 months    Recent Outpatient Visits           5 months ago Type 2 diabetes mellitus with hyperglycemia, without long-term current use of insulin (HCC)   [facility] [facility] Dept Of [place]. [facility] Hoy Register, MD   10 months ago Type 2 diabetes mellitus with hyperglycemia, without long-term current use of insulin (HCC)   [place] [facility] Dept Of [place]. [facility] Hoy Register, MD   1 year ago Type 2 diabetes mellitus with hyperglycemia, without long-term current use of insulin (HCC)   [facility] [facility] - A Dept Of [place]. [facility] Hoy Register, MD   1 year ago Annual physical exam   [place] [facility] [place] Dept Of [place]. [facility] Hoy Register, MD   1 year ago Type 2 diabetes mellitus with  hyperglycemia, without long-term current use of insulin (HCC)   [place] [facility] - A Dept Of [place]. [facility] Alvis Lemmings, Odette Horns, MD               metFORMIN (GLUCOPHAGE) 1000 MG tablet 180 tablet 1    Sig: Take 1 tablet (1,000 mg total) by mouth 2 (two) times daily with a meal.     Endocrinology:  Diabetes - Biguanides Failed - 11/21/2023  5:35 PM      Failed - HBA1C is between 0 and 7.9 and within 180 days    HbA1c, POC (controlled diabetic range)  Date Value Ref Range Status  05/28/2023 10.3 (A) 0.0 - 7.0 % Final         Failed - B12 Level in normal range and within 720 days    No results found for: "VITAMINB12"       Failed - CBC within normal limits and completed in the last 12 months    WBC  Date Value Ref Range Status  06/01/2021 10.3 4.0 - 10.5 K/uL Final   RBC  Date Value Ref Range Status  06/01/2021 4.33 3.87 - 5.11 MIL/uL Final   Hemoglobin  Date Value Ref Range  Status  06/01/2021 13.2 12.0 - 15.0 g/dL Final   HCT  Date Value Ref Range Status  06/01/2021 39.9 36.0 - 46.0 % Final   MCHC  Date Value Ref Range Status  06/01/2021 33.1 30.0 - 36.0 g/dL Final   [facility]  Date Value Ref Range Status  06/01/2021 30.5 26.0 - 34.0 pg Final   MCV  Date Value Ref Range Status  06/01/2021 92.1 80.0 - 100.0 fL Final   No results found for: "PLTCOUNTKUC", "LABPLAT", "POCPLA" RDW  Date Value Ref Range Status  06/01/2021 13.1 11.5 - 15.5 % Final         Passed - Cr in normal range and within 360 days    Creat  Date Value Ref Range Status  02/06/2016 0.48 (L) 0.50 - 1.05 mg/dL Final    Comment:      For patients > or = 66 years of age: The upper reference limit for Creatinine is approximately 13% higher for people identified as African-American.      Creatinine, Ser  Date Value Ref Range Status  05/28/2023 0.62 0.57 - 1.00 mg/dL Final   Creatinine, Urine  Date Value Ref Range Status  02/06/2016 143 20 - 320 mg/dL Final          Passed - eGFR in normal range and within 360 days    GFR, Est African American  Date Value Ref Range Status  02/06/2016 >89 >=60 mL/min Final   GFR calc Af Amer  Date Value Ref Range Status  10/03/2020 112 >59 mL/min/1.73 Corrected    Comment:    **In accordance with recommendations from the NKF-ASN Task force,**   [facility] is in the process of updating its eGFR calculation to the   2021 CKD-EPI creatinine equation that estimates kidney function   without a race variable.    GFR, Est Non African American  Date Value Ref Range Status  02/06/2016 >89 >=60 mL/min Final   GFR, Estimated  Date Value Ref Range Status  06/01/2021 >60 >60 mL/min Final    Comment:    (NOTE) Calculated using the CKD-EPI Creatinine Equation (2021)    eGFR  Date Value Ref Range Status  05/28/2023 99 >59 mL/min/1.73 Final         Passed - Valid encounter within last 6 months    Recent Outpatient Visits           5 months ago Type 2 diabetes mellitus with hyperglycemia, without long-term current use of insulin (HCC)   [facility] [facility] Dept Of [place]. [facility] Hoy Register, MD   10 months ago Type 2 diabetes mellitus with hyperglycemia, without long-term current use of insulin (HCC)   [place] [facility] - [facility] Dept Of [place]. [facility] Hoy Register, MD   1 year ago Type 2 diabetes mellitus with hyperglycemia, without long-term current use of insulin (HCC)   [facility] [facility] - A Dept Of [place]. [facility] Hoy Register, MD   1 year ago Annual physical exam   [place] [facility] [place] - A Dept Of [place]. [facility] Hoy Register, MD   1 year ago Type 2 diabetes mellitus with hyperglycemia, without long-term current use of insulin (HCC)   [facility] [facility] - A Dept Of [place]. [facility] Hoy Register, MD               Insulin Glargine (  BASAGLAR KWIKPEN) 100  UNIT/ML 15 mL 0    Sig: Inject 35 Units into the skin at bedtime. Please call office for appointment before next refill is due.     Endocrinology:  Diabetes - Insulins Failed - 11/21/2023  5:35 PM      Failed - HBA1C is between 0 and 7.9 and within 180 days    HbA1c, POC (controlled diabetic range)  Date Value Ref Range Status  05/28/2023 10.3 (A) 0.0 - 7.0 % Final         Passed - Valid encounter within last 6 months    Recent Outpatient Visits           5 months ago Type 2 diabetes mellitus with hyperglycemia, without long-term current use of insulin (HCC)   [facility] [place] [facility] Dept Of [place]. [facility] Hoy Register, MD   10 months ago Type 2 diabetes mellitus with hyperglycemia, without long-term current use of insulin (HCC)   [place] [facility] - [facility] Dept Of [place]. [facility] Hoy Register, MD   1 year ago Type 2 diabetes mellitus with hyperglycemia, without long-term current use of insulin (HCC)   [place] [facility] - A Dept Of [place]. [facility] Hoy Register, MD   1 year ago Annual physical exam   [place] [facility] [place] - A Dept Of [place]. [facility] Hoy Register, MD   1 year ago Type 2 diabetes mellitus with hyperglycemia, without long-term current use of insulin (HCC)   [place] [facility] - A Dept Of . [facility] Alvis Lemmings, Odette Horns, MD               meloxicam (MOBIC) 7.5 MG tablet 30 tablet 0    Sig: Take 1 tablet (7.5 mg total) by mouth daily as needed for pain. Please call office for appointment before next refill is due.     Analgesics:  COX2 Inhibitors Failed - 11/21/2023  5:35 PM      Failed - Manual Review: Labs are only required if the patient has taken medication for more than 8 weeks.      Failed - HGB in normal range and within 360 days    Hemoglobin  Date Value Ref Range Status  06/01/2021 13.2 12.0 - 15.0 g/dL Final          Failed - HCT in normal range and within 360 days    HCT  Date Value Ref Range Status  06/01/2021 39.9 36.0 - 46.0 % Final         Failed - AST in normal range and within 360 days    AST  Date Value Ref Range Status  11/13/2022 32 0 - 40 IU/L Final         Failed - ALT in normal range and within 360 days    ALT  Date Value Ref Range Status  11/13/2022 22 0 - 32 IU/L Final         Passed - Cr in normal range and within 360 days    Creat  Date Value Ref Range Status  02/06/2016 0.48 (L) 0.50 - 1.05 mg/dL Final    Comment:      For patients > or = 67 years of age: The upper reference limit for Creatinine is approximately 13% higher for people identified as African-American.      Creatinine, Ser  Date Value Ref  Range Status  05/28/2023 0.62 0.57 - 1.00 mg/dL Final   Creatinine, Urine  Date Value Ref Range Status  02/06/2016 143 20 - 320 mg/dL Final         Passed - eGFR is 30 or above and within 360 days    GFR, Est African American  Date Value Ref Range Status  02/06/2016 >89 >=60 mL/min Final   GFR calc Af Amer  Date Value Ref Range Status  10/03/2020 112 >59 mL/min/1.73 Corrected    Comment:    **In accordance with recommendations from the NKF-ASN Task force,**   [facility] is in the process of updating its eGFR calculation to the   2021 CKD-EPI creatinine equation that estimates kidney function   without a race variable.    GFR, Est Non African American  Date Value Ref Range Status  02/06/2016 >89 >=60 mL/min Final   GFR, Estimated  Date Value Ref Range Status  06/01/2021 >60 >60 mL/min Final    Comment:    (NOTE) Calculated using the CKD-EPI Creatinine Equation (2021)    eGFR  Date Value Ref Range Status  05/28/2023 99 >59 mL/min/1.73 Final         Passed - Patient is not pregnant      Passed - Valid encounter within last 12 months    Recent Outpatient Visits           5 months ago Type 2 diabetes mellitus with hyperglycemia, without  long-term current use of insulin (HCC)   [place] [facility] Dept Of [place]. [facility] Hoy Register, MD   10 months ago Type 2 diabetes mellitus with hyperglycemia, without long-term current use of insulin (HCC)   [place] [facility] [place] [facility] - A Dept Of [place]. [facility] Hoy Register, MD   1 year ago Type 2 diabetes mellitus with hyperglycemia, without long-term current use of insulin (HCC)   [place] [facility] - A Dept Of [place]. [facility] Hoy Register, MD   1 year ago Annual physical exam   [place] [facility] [place] [facility] Dept Of [place]. [facility] Hoy Register, MD   1 year ago Type 2 diabetes mellitus with hyperglycemia, without long-term current use of insulin (HCC)   [place] [facility] - A Dept Of [place]. [facility] Hoy Register, MD

## 2023-11-22 ENCOUNTER — Other Ambulatory Visit: Payer: Self-pay

## 2023-11-22 MED ORDER — ATORVASTATIN CALCIUM 80 MG PO TABS
80.0000 mg | ORAL_TABLET | Freq: Every day | ORAL | 0 refills | Status: DC
  Filled 2023-11-22: qty 30, 30d supply, fill #0

## 2023-11-22 MED ORDER — BASAGLAR KWIKPEN 100 UNIT/ML ~~LOC~~ SOPN: 35.0000 [IU] | PEN_INJECTOR | Freq: Every day | SUBCUTANEOUS | 0 refills | Status: DC

## 2023-11-22 MED ORDER — METFORMIN HCL 1000 MG PO TABS
1000.0000 mg | ORAL_TABLET | Freq: Two times a day (BID) | ORAL | 0 refills | Status: DC
  Filled 2023-11-22: qty 60, 30d supply, fill #0

## 2023-11-22 MED ORDER — MELOXICAM 7.5 MG PO TABS
7.5000 mg | ORAL_TABLET | Freq: Every day | ORAL | 0 refills | Status: DC | PRN
Start: 2023-11-22 — End: 2023-11-26

## 2023-11-26 ENCOUNTER — Encounter (HOSPITAL_COMMUNITY): Payer: Self-pay

## 2023-11-26 ENCOUNTER — Ambulatory Visit (HOSPITAL_COMMUNITY)
Admission: EM | Admit: 2023-11-26 | Discharge: 2023-11-26 | Disposition: A | Discharge status: Home / Self Care | Payer: Self-pay | Attending: Emergency Medicine | Admitting: Emergency Medicine

## 2023-11-26 ENCOUNTER — Other Ambulatory Visit: Payer: Self-pay

## 2023-11-26 DIAGNOSIS — Z794 Long term (current) use of insulin: Secondary | ICD-10-CM

## 2023-11-26 DIAGNOSIS — M79671 Pain in right foot: Secondary | ICD-10-CM

## 2023-11-26 DIAGNOSIS — M722 Plantar fascial fibromatosis: Secondary | ICD-10-CM

## 2023-11-26 DIAGNOSIS — M79672 Pain in left foot: Secondary | ICD-10-CM

## 2023-11-26 DIAGNOSIS — E1142 Type 2 diabetes mellitus with diabetic polyneuropathy: Secondary | ICD-10-CM

## 2023-11-26 MED ORDER — IBUPROFEN 600 MG PO TABS
600.0000 mg | ORAL_TABLET | Freq: Four times a day (QID) | ORAL | 0 refills | Status: DC | PRN
  Filled 2023-11-26: qty 30, 8d supply, fill #0

## 2023-11-26 NOTE — Discharge Instructions (Addendum)
 Comunquese con su proveedor de atencin primaria para programar una cita con respecto a su diabetes. Acuda tambin a un seguimiento con Human resources officer. Su cita es el viernes a las 11:15 AM. Phylliss Bob las piernas para reducir la hinchazn y use zapatos de apoyo. Recomiendo usar inserciones en tus zapatillas tambin.

## 2023-11-26 NOTE — ED Triage Notes (Signed)
 Per interpreter, pt c/o her heels to both feet has been hurting for over a month and shooting up her legs. States Advil was helping but not anymore. Denies injury.

## 2023-11-26 NOTE — ED Provider Notes (Signed)
 [facility]    CSN: [id number] Arrival date & time: 11/26/23  1142      History   Chief Complaint Chief Complaint  Patient presents with   Foot Pain    HPI Margaret Austin is a 68 y.o. female.  Medical interpreter used for encounter Here with 1 month history of bilateral foot pain.  Reports heels hurt, and pain seems to shoot up the legs.  Worse in the mornings when she first gets out of bed.  Had been using Advil occasionally. 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  Reports wears tennis shoes that feel supportive  She does have history of uncontrolled diabetes.  Last A1c 6 months ago was 10.3.  Takes metformin twice daily, glipizide, and insulin at night.  Ozempic also recently added once weekly. 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 No pertinent surgical history.  OB History     Gravida  3   Para      Term      Preterm      AB      Living  3      SAB      IAB      Ectopic      Multiple      Live Births  3            Home Medications    Prior to Admission medications   Medication Sig Start Date End Date Taking? Authorizing Provider  ibuprofen (ADVIL) 600 MG tablet Take 1 tablet (600 mg total) by mouth every 6 (six) hours as needed. 11/26/23  Yes Natoria Archibald, Lurena Joiner, PA-C  atorvastatin (LIPITOR) 80 MG  tablet Take 1 tablet (80 mg total) by mouth daily. Please schedule an appt with Dr. Alvis Lemmings! 11/22/23   Hoy Register, MD  Blood Glucose Monitoring Suppl (TRUE METRIX METER) w/Device KIT Used as directed, 3 times daily. Patient taking differently: Check blood sugar 1-2 times daily 05/13/18   Hoy Register, MD  fluticasone (FLONASE) 50 MCG/ACT nasal spray Place 2 sprays into both nostrils daily. 09/12/17   Hoy Register, MD  glipiZIDE (GLUCOTROL) 10 MG tablet Take 1 tablet (10 mg total) by mouth 2 (two) times daily. 05/28/23   Hoy Register, MD  hydrocortisone 2.5 % cream Apply topically 2 (two) times daily. 03/12/17   Hoy Register, MD  Insulin Glargine (BASAGLAR KWIKPEN) 100 UNIT/ML Inject 35 Units into the skin at bedtime. Please call office for appointment before next refill is due. 11/22/23   Hoy Register, MD  Insulin Pen Needle 31G X 5 MM MISC Use at bedtime 01/07/23   Hoy Register, MD  lisinopril (ZESTRIL) 2.5 MG tablet Take 1 tablet (2.5 mg total) by mouth daily. 05/28/23   Hoy Register, MD  metFORMIN (GLUCOPHAGE) 1000 MG tablet Take 1 tablet (1,000 mg total) by mouth 2 (two) times daily with a meal. Please  schedule an appt with Dr. Alvis Lemmings! 11/22/23   Hoy Register, MD  OVER THE COUNTER MEDICATION Take 1 tablet by mouth daily. Vitamin from spanish store    [provider]  Semaglutide,0.25 or 0.5MG /DOS, (OZEMPIC, 0.25 OR 0.5 MG/DOSE,) 2 MG/3ML SOPN Inject 0.25 mg into the skin once a week. 05/28/23   Hoy Register, MD  tiZANidine (ZANAFLEX) 4 MG tablet Take 1 tablet (4 mg total) by mouth every 8 (eight) hours as needed. 10/09/22   Hoy Register, MD    Family History Family History  Problem Relation Age of Onset   Autoimmune disease Neg Hx     Social History Social History   Tobacco Use   Smoking status: Never   Smokeless tobacco: Never  Vaping Use   Vaping status: Never Used  Substance Use Topics   Alcohol use: No   Drug use: No     Allergies   Patient  has no known allergies.   Review of Systems Review of Systems Per HPI  Physical Exam Triage Vital Signs ED Triage Vitals  Encounter Vitals Group     BP 11/26/23 1206 (!) 165/71     Systolic BP Percentile --      Diastolic BP Percentile --      Pulse Rate 11/26/23 1206 71     Resp 11/26/23 1206 18     Temp 11/26/23 1206 97.8 F (36.6 C)     Temp Source 11/26/23 1206 Oral     SpO2 11/26/23 1206 97 %     Weight --      Height --      Head Circumference --      Peak Flow --      Pain Score 11/26/23 1207 9     Pain Loc --      Pain Education --      Exclude from Growth Chart --    No data found.  Updated Vital Signs BP (!) 165/71 (BP Location: Left Arm)   Pulse 71   Temp 97.8 F (36.6 C) (Oral)   Resp 18   SpO2 97%   Physical Exam Vitals and nursing note reviewed.  Constitutional:      General: She is not in acute distress. HENT:     Mouth/Throat:     Pharynx: Oropharynx is clear.  Cardiovascular:     Rate and Rhythm: Normal rate and regular rhythm.     Pulses: Normal pulses.     Heart sounds: Normal heart sounds.  Pulmonary:     Effort: Pulmonary effort is normal.     Breath sounds: Normal breath sounds.  Musculoskeletal:     Cervical back: Normal range of motion.     Right foot: Normal range of motion and normal capillary refill. Tenderness present. No swelling, deformity or bony tenderness. Normal pulse.     Left foot: Normal range of motion and normal capillary refill. Tenderness present. No swelling, deformity or bony tenderness. Normal pulse.     Comments: Tenderness over bilateral heels and arches.  Minimal tenderness extending up the lower legs.  No bony tenderness of ankles or feet.  Full ROM of bilateral lower extremities.  DP pulse 2+, sensation intact and equal throughout.  Cap refill of the toes less than 2 seconds  Skin:    Capillary Refill: Capillary refill takes less than 2 seconds.  Neurological:     Mental Status: She is alert and oriented to  person, place, and time.     UC Treatments /  Results  Labs (all labs ordered are listed, but only abnormal results are displayed) Labs Reviewed - No data to display  EKG  Radiology No results found.  Procedures Procedures (including critical care time)  Medications Ordered in UC Medications - No data to display  Initial Impression / Assessment and Plan / UC Course  I have reviewed the triage vital signs and the nursing notes.  Pertinent labs & imaging results that were available during my care of the patient were reviewed by me and considered in my medical decision making (see chart for details).  Likely combination of diabetes/neuropathy, and plantar fasciitis. I have scheduled patient with podiatrist for Friday (in 3 days) In the meantime recommend elevating, shoe inserts, supportive shoes.  Patient requesting pain medicine, ibuprofen sent to pharmacy  She is going to contact PCP for follow up regarding DM. Work note is provided All questions answered Patient agrees to plan  Final Clinical Impressions(s) / UC Diagnoses   Final diagnoses:  Bilateral plantar fasciitis  Bilateral foot pain  Type 2 diabetes mellitus with diabetic polyneuropathy, with long-term current use of insulin [facility])     Discharge Instructions      Comunquese con su proveedor de atencin primaria para programar una cita con respecto a su diabetes. Acuda tambin a un seguimiento con Human resources officer. Su cita es el viernes a las 11:15 AM. Phylliss Bob las piernas para reducir la hinchazn y use zapatos de apoyo. Recomiendo usar inserciones en tus zapatillas tambin.      ED Prescriptions     Medication Sig Dispense Auth. Provider   ibuprofen (ADVIL) 600 MG tablet Take 1 tablet (600 mg total) by mouth every 6 (six) hours as needed. 30 tablet Zakar Brosch, Lurena Joiner, PA-C      PDMP not reviewed this encounter.   Marlow Baars, [place] 11/26/23 1355

## 2023-11-29 ENCOUNTER — Ambulatory Visit: Admitting: Podiatry

## 2024-01-30 ENCOUNTER — Other Ambulatory Visit: Payer: Self-pay | Admitting: Family Medicine

## 2024-01-30 DIAGNOSIS — E1165 Type 2 diabetes mellitus with hyperglycemia: Secondary | ICD-10-CM

## 2024-01-30 NOTE — Telephone Encounter (Signed)
 Copied from CRM [id number]. Topic: Clinical - Medication Refill >> Jan 30, 2024  4:45 PM DeAngela L wrote: Medication:  Insulin  Glargine (BASAGLAR  KWIKPEN) 100 UNIT/ML metFORMIN  (GLUCOPHAGE ) 1000 MG tablet glipiZIDE  (GLUCOTROL ) 10 MG tablet Meloxicam  7.5 mg oral daily (patient would like to ask for this medication to be refilled)  Has the patient contacted their pharmacy? Yes  (Agent: If no, request that the patient contact the pharmacy for the refill. If patient does not wish to contact the pharmacy document the reason why and proceed with request.) (Agent: If yes, when and what did the pharmacy advise?)  This is the patient's preferred pharmacy:  [facility] [address] [place] [address] Phone: [phone] Fax: [phone]  Is this the correct pharmacy for this prescription? Yes  If no, delete pharmacy and type the correct one.   Has the prescription been filled recently? Yes   Is the patient out of the medication? Yes   Has the patient been seen for an appointment in the last year OR does the patient have an upcoming appointment? Yes   Can we respond through MyChart? No   Agent: Please be advised that Rx refills may take up to 3 business days. We ask that you follow-up with your pharmacy.

## 2024-01-30 NOTE — Telephone Encounter (Signed)
 Also requesting Meloxicam  7.5 mg oral daily not on current list

## 2024-02-03 ENCOUNTER — Ambulatory Visit: Payer: Self-pay | Attending: Family Medicine | Admitting: Family Medicine

## 2024-02-03 ENCOUNTER — Encounter: Payer: Self-pay | Admitting: Family Medicine

## 2024-02-03 ENCOUNTER — Other Ambulatory Visit: Payer: Self-pay

## 2024-02-03 VITALS — BP 123/75 | HR 78 | Ht 62.0 in | Wt 155.6 lb

## 2024-02-03 DIAGNOSIS — Z7984 Long term (current) use of oral hypoglycemic drugs: Secondary | ICD-10-CM

## 2024-02-03 DIAGNOSIS — E1169 Type 2 diabetes mellitus with other specified complication: Secondary | ICD-10-CM

## 2024-02-03 DIAGNOSIS — Z634 Disappearance and death of family member: Secondary | ICD-10-CM

## 2024-02-03 DIAGNOSIS — M722 Plantar fascial fibromatosis: Secondary | ICD-10-CM

## 2024-02-03 DIAGNOSIS — B3731 Acute candidiasis of vulva and vagina: Secondary | ICD-10-CM

## 2024-02-03 DIAGNOSIS — H44113 Panuveitis, bilateral: Secondary | ICD-10-CM

## 2024-02-03 DIAGNOSIS — Z1231 Encounter for screening mammogram for malignant neoplasm of breast: Secondary | ICD-10-CM

## 2024-02-03 DIAGNOSIS — Z794 Long term (current) use of insulin: Secondary | ICD-10-CM

## 2024-02-03 DIAGNOSIS — I10 Essential (primary) hypertension: Secondary | ICD-10-CM

## 2024-02-03 DIAGNOSIS — E785 Hyperlipidemia, unspecified: Secondary | ICD-10-CM

## 2024-02-03 DIAGNOSIS — Z1211 Encounter for screening for malignant neoplasm of colon: Secondary | ICD-10-CM

## 2024-02-03 DIAGNOSIS — E1165 Type 2 diabetes mellitus with hyperglycemia: Secondary | ICD-10-CM

## 2024-02-03 LAB — POCT GLYCOSYLATED HEMOGLOBIN (HGB A1C): HbA1c, POC (controlled diabetic range): 13 % — AB (ref 0.0–7.0)

## 2024-02-03 MED ORDER — FLUCONAZOLE 150 MG PO TABS
150.0000 mg | ORAL_TABLET | Freq: Once | ORAL | 0 refills | Status: AC
  Filled 2024-02-03: qty 1, 1d supply, fill #0

## 2024-02-03 MED ORDER — METFORMIN HCL 1000 MG PO TABS
1000.0000 mg | ORAL_TABLET | Freq: Two times a day (BID) | ORAL | 1 refills | Status: DC
  Filled 2024-02-03: qty 180, 90d supply, fill #0
  Filled 2024-04-29 (×2): qty 180, 90d supply, fill #1

## 2024-02-03 MED ORDER — ATORVASTATIN CALCIUM 80 MG PO TABS
80.0000 mg | ORAL_TABLET | Freq: Every day | ORAL | 1 refills | Status: DC
  Filled 2024-02-03: qty 90, 90d supply, fill #0
  Filled 2024-04-29 (×2): qty 90, 90d supply, fill #1

## 2024-02-03 MED ORDER — GLIPIZIDE 10 MG PO TABS
10.0000 mg | ORAL_TABLET | Freq: Two times a day (BID) | ORAL | 1 refills | Status: DC
Start: 2024-02-03 — End: 2024-05-05
  Filled 2024-02-03: qty 180, 90d supply, fill #0
  Filled 2024-04-29 (×2): qty 180, 90d supply, fill #1

## 2024-02-03 MED ORDER — BASAGLAR KWIKPEN 100 UNIT/ML ~~LOC~~ SOPN
35.0000 [IU] | PEN_INJECTOR | Freq: Every day | SUBCUTANEOUS | 1 refills | Status: DC
  Filled 2024-02-03: qty 9, 25d supply, fill #0

## 2024-02-03 MED ORDER — LISINOPRIL 2.5 MG PO TABS
2.5000 mg | ORAL_TABLET | Freq: Every day | ORAL | 1 refills | Status: DC
Start: 2024-02-03 — End: 2024-05-05
  Filled 2024-02-03: qty 90, 90d supply, fill #0
  Filled 2024-04-29 (×2): qty 90, 90d supply, fill #1

## 2024-02-03 NOTE — Telephone Encounter (Signed)
 Requested medications are due for refill today.  yes  Requested medications are on the active medications list.  yes  Last refill. varied  Future visit scheduled.   today  Notes to clinic.  Pt last seen 05/2023. Pt has appt for today. Pt is also requesting Mobic  7.5mg  to be prescribed. Please review for refills.    Requested Prescriptions  Pending Prescriptions Disp Refills   Insulin  Glargine (BASAGLAR  KWIKPEN) 100 UNIT/ML 9 mL 0    Sig: Inject 35 Units into the skin at bedtime. Please call office for appointment before next refill is due.     Endocrinology:  Diabetes - Insulins Failed - 02/03/2024  9:56 AM      Failed - HBA1C is between 0 and 7.9 and within 180 days    HbA1c, POC (controlled diabetic range)  Date Value Ref Range Status  05/28/2023 10.3 (A) 0.0 - 7.0 % Final         Failed - Valid encounter within last 6 months    Recent Outpatient Visits           8 months ago Type 2 diabetes mellitus with hyperglycemia, without long-term current use of insulin  (HCC)   [facility] [facility] [place]. [facility] Joaquin Mulberry, MD   1 year ago Type 2 diabetes mellitus with hyperglycemia, without long-term current use of insulin  [facility])   [place] [facility] Of [place]. [facility] Joaquin Mulberry, MD   1 year ago Type 2 diabetes mellitus with hyperglycemia, without long-term current use of insulin  [facility])   [place] [facility] - A Dept Of [place]. [facility] Joaquin Mulberry, MD   1 year ago Annual physical exam   [place] [facility] [place] [facility] Of [place]. [facility] Joaquin Mulberry, MD   1 year ago Type 2 diabetes mellitus with hyperglycemia, without long-term current use of insulin  [facility])   [place] [facility] - [facility] Dept Of [place]. [facility] Adan Holms, [place], MD               metFORMIN  (GLUCOPHAGE ) 1000 MG tablet 60 tablet 0    Sig: Take 1  tablet (1,000 mg total) by mouth 2 (two) times daily with a meal. Please schedule an appt with Dr. Newlin!     Endocrinology:  Diabetes - Biguanides Failed - 02/03/2024  9:56 AM      Failed - HBA1C is between 0 and 7.9 and within 180 days    HbA1c, POC (controlled diabetic range)  Date Value Ref Range Status  05/28/2023 10.3 (A) 0.0 - 7.0 % Final         Failed - B12 Level in normal range and within 720 days    No results found for: VITAMINB12       Failed - Valid encounter within last 6 months    Recent Outpatient Visits           8 months ago Type 2 diabetes mellitus with hyperglycemia, without long-term current use of insulin  (HCC)   [place] [facility] Of [place]. [facility] Joaquin Mulberry, MD   1 year ago Type 2 diabetes mellitus with hyperglycemia, without long-term current use of insulin  [facility])   [place] [facility] Dept Of [place]. [facility] Joaquin Mulberry, MD   1 year ago Type  2 diabetes mellitus with hyperglycemia, without long-term current use of insulin  [facility])   [place] [facility] - [facility] Dept Of [place]. [facility] Joaquin Mulberry, MD   1 year ago Annual physical exam   [place] [facility] [place] [facility] Dept Of [place]. [facility] Joaquin Mulberry, MD   1 year ago Type 2 diabetes mellitus with hyperglycemia, without long-term current use of insulin  [facility])    [facility] - A Dept Of [place]. [facility] Joaquin Mulberry, MD              Failed - CBC within normal limits and completed in the last 12 months    WBC  Date Value Ref Range Status  06/01/2021 10.3 4.0 - 10.5 K/uL Final   RBC  Date Value Ref Range Status  06/01/2021 4.33 3.87 - 5.11 MIL/uL Final   Hemoglobin  Date Value Ref Range Status  06/01/2021 13.2 12.0 - 15.0 g/dL Final   HCT  Date Value Ref Range Status  06/01/2021 39.9 36.0 - 46.0 % Final   MCHC  Date Value Ref  Range Status  06/01/2021 33.1 30.0 - 36.0 g/dL Final   [facility]  Date Value Ref Range Status  06/01/2021 30.5 26.0 - 34.0 pg Final   MCV  Date Value Ref Range Status  06/01/2021 92.1 80.0 - 100.0 fL Final   No results found for: PLTCOUNTKUC, LABPLAT, POCPLA RDW  Date Value Ref Range Status  06/01/2021 13.1 11.5 - 15.5 % Final         Passed - Cr in normal range and within 360 days    Creat  Date Value Ref Range Status  02/06/2016 0.48 (L) 0.50 - 1.05 mg/dL Final    Comment:      For patients > or = 66 years of age: The upper reference limit for Creatinine is approximately 13% higher for people identified as African-American.      Creatinine, Ser  Date Value Ref Range Status  05/28/2023 0.62 0.57 - 1.00 mg/dL Final   Creatinine, Urine  Date Value Ref Range Status  02/06/2016 143 20 - 320 mg/dL Final         Passed - eGFR in normal range and within 360 days    GFR, Est African American  Date Value Ref Range Status  02/06/2016 >89 >=60 mL/min Final   GFR calc Af Amer  Date Value Ref Range Status  10/03/2020 112 >59 mL/min/1.73 Corrected    Comment:    **In accordance with recommendations from the NKF-ASN Task force,**   [facility] is in the process of updating its eGFR calculation to the   2021 CKD-EPI creatinine equation that estimates kidney function   without a race variable.    GFR, Est Non African American  Date Value Ref Range Status  02/06/2016 >89 >=60 mL/min Final   GFR, Estimated  Date Value Ref Range Status  06/01/2021 >60 >60 mL/min Final    Comment:    (NOTE) Calculated using the CKD-EPI Creatinine Equation (2021)    eGFR  Date Value Ref Range Status  05/28/2023 99 >59 mL/min/1.73 Final          glipiZIDE  (GLUCOTROL ) 10 MG tablet 180 tablet 1    Sig: Take 1 tablet (10 mg total) by mouth 2 (two) times daily.     Endocrinology:  Diabetes - Sulfonylureas Failed - 02/03/2024  9:56 AM      Failed - HBA1C is  between 0 and 7.9 and within  180 days    HbA1c, POC (controlled diabetic range)  Date Value Ref Range Status  05/28/2023 10.3 (A) 0.0 - 7.0 % Final         Failed - Valid encounter within last 6 months    Recent Outpatient Visits           8 months ago Type 2 diabetes mellitus with hyperglycemia, without long-term current use of insulin  [facility])   [place] [facility] Of [place]. [facility] Joaquin Mulberry, MD   1 year ago Type 2 diabetes mellitus with hyperglycemia, without long-term current use of insulin  [facility])   [place] [facility] Dept Of [place]. [facility] Joaquin Mulberry, MD   1 year ago Type 2 diabetes mellitus with hyperglycemia, without long-term current use of insulin  [facility])   [place] [facility] - A Dept Of [place]. [facility] Joaquin Mulberry, MD   1 year ago Annual physical exam   [place] [facility] [place] [facility] Dept Of [place]. [facility] Joaquin Mulberry, MD   1 year ago Type 2 diabetes mellitus with hyperglycemia, without long-term current use of insulin  [facility])    [facility] - A Dept Of [place]. [facility] [place], Lavelle Posey, MD              Passed - Cr in normal range and within 360 days    Creat  Date Value Ref Range Status  02/06/2016 0.48 (L) 0.50 - 1.05 mg/dL Final    Comment:      For patients > or = 66 years of age: The upper reference limit for Creatinine is approximately 13% higher for people identified as African-American.      Creatinine, Ser  Date Value Ref Range Status  05/28/2023 0.62 0.57 - 1.00 mg/dL Final   Creatinine, Urine  Date Value Ref Range Status  02/06/2016 143 20 - 320 mg/dL Final

## 2024-02-03 NOTE — Patient Instructions (Signed)
 Fascitis plantar: qu debe saber Plantar Fasciitis: What to Know  La fascia plantar es una banda de tejido grueso que se encuentra en la parte inferior del pie. Conecta el hueso del taln con la base de los dedos del pie. Si la fascia se irrita, puede causar dolor en el taln o en el pie. Esto se denomina fascitis plantar. En algunos casos, la fascitis plantar puede hacer que le resulte difcil caminar o moverse. El dolor suele ser peor a la maana despus de dormir, o despus de Personal assistant sentado o acostado durante un perodo de [place]. El dolor tambin puede ser peor despus de caminar o estar de pie por [employer]. Cules son las causas? Las causas de la fascitis plantar pueden ser las siguientes: Estar de pie durante [place]. Usar zapatos que no tengan un buen soporte para el arco. Realizar actividades de alto impacto. Estas son cosas que Teacher, music. Incluyen lo siguiente: Ballet. Ejercicios aerbicos. Estos son ejercicios que hacen que el corazn lata ms rpido. Tener sobrepeso. Tener una forma de caminar, o marcha, que no es normal. Tener rigidez muscular en la pantorrilla, que es la parte posterior de la parte inferior de la pierna. Arcos Google, o pies planos. Comenzar un nuevo deporte o [place]. Cules son los signos o sntomas? El sntoma principal de la fascitis plantar es dolor en el taln. El dolor puede empeorar despus de lo siguiente: Al dar sus primeros pasos despus de un tiempo de reposo. Esto incluye por la maana despus de despertarse, o despus de haber estado sentado o acostado durante un [place]. Estar de pie quieto durante [employer]. El dolor puede disminuir despus de 30 a 45 minutos de [place], como caminar apaciblemente. Cmo se diagnostica? La fascitis plantar se puede diagnosticar en funcin de sus antecedentes mdicos, sus sntomas y un examen. El mdico controlar lo siguiente: Una zona dolorida en la parte  inferior del pie. Arco alto en el pie o pies planos. Dolor al Doctor, general practice. Dificultad para mover el pie. Tambin pueden hacerle pruebas. Pueden incluir: Radiografas. Ecografa. Resonancia magntica (RM). Cmo se trata? El tratamiento depende de la gravedad de su fascitis plantar. Puede incluir lo siguiente: Terapia de RHCE. Esto significa reposo, hielo, presin (compresin) y Lexicographer (elevar) el pie afectado. Ejercicios para estirar las pantorrillas y la fascia plantar. Una frula nocturna. Esta mantiene el pie estirado y [place] mientras usted duerme. Fisioterapia. Esta puede ayudar con los sntomas. Tambin puede prevenir problemas en el futuro. Inyecciones de un medicamento con corticoesteroides llamado cortisona. Esto puede ayudar a Engineer, materials y Youth worker. Terapia extracorprea con ondas de choque. Este tratamiento utiliza choques elctricos para estimular la fascia plantar. Si otros tratamientos no ayudan, tal vez deba someterse a [place]. Siga estas instrucciones en su casa: Control del dolor, la rigidez y la hinchazn  Use hielo, una bolsa de hielo o una botella de agua congelada como se lo hayan indicado. Coloque una toalla entre la piel y el hielo. Frote la parte inferior del pie sobre el hielo o la botella congelada. Haga esto durante 20 minutos, de 2 a 3 veces al [employer]. Si la piel se le pone de color rojo, quite el hielo de inmediato para evitar daos en la piel. [place] de dao es mayor si no puede sentir dolor, Airline pilot o fro. Use calzado que tenga suela con amortiguacin de aire o almohadillas de gel. Tambin podra probar plantillas blandas hechas para la fascitis  plantar. Actividad [place] de no hacer cosas que le causen [employer]. Pregunte qu cosas son seguras para que haga. Haga ejercicio segn las indicaciones. Pruebe actividades de bajo impacto. Esto significa que son ms suaves para las articulaciones. Incluyen lo siguiente: Nadar. Gimnasia  acutica. Andar en bicicleta. Instrucciones generales Tome sus medicamentos nicamente segn las indicaciones. Use una frula nocturna como le hayan indicado. Afloje la frula si siente hormigueo en los dedos de los pies, si estn entumecidos, o si se le enfran y se tornan de [employer]. Mantenga un peso saludable. De ser necesario, trabaje con su mdico para perder [employer]. Comunquese con un mdico si: Los sntomas no desaparecen con Scientist, research (medical). Su dolor empeora. El dolor hace que le sea difcil moverse o hacer las cosas cotidianas. Esta informacin no tiene Theme park manager el consejo del mdico. Asegrese de hacerle al mdico cualquier pregunta que tenga. Document Revised: 02/18/2023 Document Reviewed: 02/18/2023 Elsevier Patient Education  2024 [employer].

## 2024-02-03 NOTE — Progress Notes (Signed)
 Subjective:  Patient ID: Margaret Austin, female    DOB: [birth year]  Age: 68 y.o. MRN: [id number]  CC: Medical Management of Chronic Issues (Grief)     Discussed the use of AI scribe software for clinical note transcription with the patient, who gave verbal consent to proceed.  History of Present Illness Margaret Austin is a 68 year old female with  a history of Diabetes Mellitus type 2, Hyperlipidemia, panuveitis of both eyes, left eye retinal detachment.  who presents with elevated blood sugar levels and medication issues.  Her A1c has increased from 10.3 in October to 13.0 currently. She is on Lantus  insulin , 36 units at night, and metformin , 1000 mg, one tablet in the morning and one in the afternoon, glipizide  10 mg twice daily. She has not received her prescribed Ozempic  which was initiated at her last visit 8 months ago or meloxicam  from the pharmacy. Endorses adherence with her statin and antihypertensive.  She has symptoms of a vaginal yeast infection, including itching.  She experiences persistent heel pain in her right foot when walking relieved at rest.  She is grieving the recent loss of her nephew, who was killed by a stray bullet in [place], and is unable to travel to [place] for the funeral due to her ongoing vision process with ophthalmology.  She is currently being managed by [facility] ophthalmologist with her last visit 1 week ago    Past Medical History:  Diagnosis Date   Diabetes mellitus without complication (HCC)     No past surgical history on file.  Family History  Problem Relation Age of Onset   Autoimmune disease Neg Hx     Social History   Socioeconomic History   Marital status: Single    Spouse name: Not on file   Number of children: Not on file   Years of education: Not on file   Highest education level: Not on file  Occupational History   Not on file  Tobacco Use   Smoking status: Never   Smokeless tobacco: Never   Vaping Use   Vaping status: Never Used  Substance and Sexual Activity   Alcohol use: No   Drug use: No   Sexual activity: Yes    Birth control/protection: None  Other Topics Concern   Not on file  Social History Narrative   ** Merged History Encounter **       Social Drivers of Corporate investment banker Strain: Not on file  Food Insecurity: Not on file  Transportation Needs: Not on file  Physical Activity: Not on file  Stress: Not on file  Social Connections: Not on file    No Known Allergies  Outpatient Medications Prior to Visit  Medication Sig Dispense Refill   Blood Glucose Monitoring Suppl (TRUE METRIX METER) w/Device KIT Used as directed, 3 times daily. (Patient taking differently: Check blood sugar 1-2 times daily) 1 kit 0   fluticasone  (FLONASE ) 50 MCG/ACT nasal spray Place 2 sprays into both nostrils daily. 16 g 5   hydrocortisone  2.5 % cream Apply topically 2 (two) times daily. 30 g 0   ibuprofen  (ADVIL ) 600 MG tablet Take 1 tablet (600 mg total) by mouth every 6 (six) hours as needed. 30 tablet 0   Insulin  Pen Needle 31G X 5 MM MISC Use at bedtime 100 each 5   OVER THE COUNTER MEDICATION Take 1 tablet by mouth daily. Vitamin from [employer]     Semaglutide ,0.25 or 0.5MG /DOS, (OZEMPIC , 0.25  OR 0.5 MG/DOSE,) 2 MG/3ML SOPN Inject 0.25 mg into the skin once a week. 3 mL 3   tiZANidine  (ZANAFLEX ) 4 MG tablet Take 1 tablet (4 mg total) by mouth every 8 (eight) hours as needed. 60 tablet 1   atorvastatin  (LIPITOR) 80 MG tablet Take 1 tablet (80 mg total) by mouth daily. Please schedule an appt with Dr. Paxtyn Wisdom! 30 tablet 0   glipiZIDE  (GLUCOTROL ) 10 MG tablet Take 1 tablet (10 mg total) by mouth 2 (two) times daily. 180 tablet 1   Insulin  Glargine (BASAGLAR  KWIKPEN) 100 UNIT/ML Inject 35 Units into the skin at bedtime. Please call office for appointment before next refill is due. 9 mL 0   lisinopril  (ZESTRIL ) 2.5 MG tablet Take 1 tablet (2.5 mg total) by mouth daily.  90 tablet 1   metFORMIN  (GLUCOPHAGE ) 1000 MG tablet Take 1 tablet (1,000 mg total) by mouth 2 (two) times daily with a meal. Please schedule an appt with Dr. Amar Keenum! 60 tablet 0   No facility-administered medications prior to visit.     ROS Review of Systems  Constitutional:  Negative for activity change and appetite change.  HENT:  Negative for sinus pressure and sore throat.   Respiratory:  Negative for chest tightness, shortness of breath and wheezing.   Cardiovascular:  Negative for chest pain and palpitations.  Gastrointestinal:  Negative for abdominal distention, abdominal pain and constipation.  Genitourinary: Negative.   Musculoskeletal:        See HPI  Psychiatric/Behavioral:  Negative for behavioral problems and dysphoric mood.     Objective:  BP 123/75   Pulse 78   Ht 5' 2 (1.575 m)   Wt 155 lb 9.6 oz (70.6 kg)   SpO2 99%   BMI 28.46 kg/m      02/03/2024    3:22 PM 11/26/2023   12:06 PM 05/28/2023    2:05 PM  BP/Weight  Systolic BP 123 165 134  Diastolic BP 75 71 76  Wt. (Lbs) 155.6    BMI 28.46 kg/m2        Physical Exam Constitutional:      Appearance: She is well-developed.   Cardiovascular:     Rate and Rhythm: Normal rate.     Heart sounds: Normal heart sounds. No murmur heard. Pulmonary:     Effort: Pulmonary effort is normal.     Breath sounds: Normal breath sounds. No wheezing or rales.  Chest:     Chest wall: No tenderness.  Abdominal:     General: Bowel sounds are normal. There is no distension.     Palpations: Abdomen is soft. There is no mass.     Tenderness: There is no abdominal tenderness.   Musculoskeletal:        General: Normal range of motion.     Right lower leg: No edema.     Left lower leg: No edema.     Comments: Slight tenderness on palpation of medial aspect of right heel   Neurological:     Mental Status: She is alert and oriented to person, place, and time.   Psychiatric:        Mood and Affect: Mood normal.         Latest Ref Rng & Units 05/28/2023    2:12 PM 11/13/2022    2:59 PM 10/09/2022    3:18 PM  CMP  Glucose 70 - 99 mg/dL 83  725  73   BUN 8 - 27 mg/dL 9  10  7  Creatinine 0.57 - 1.00 mg/dL 2.53  6.64  4.03   Sodium 134 - 144 mmol/L 142  139  142   Potassium 3.5 - 5.2 mmol/L 4.1  4.1  3.8   Chloride 96 - 106 mmol/L 102  103  105   CO2 20 - 29 mmol/L 22  22  22    Calcium  8.7 - 10.3 mg/dL 9.0  9.0  9.0   Total Protein 6.0 - 8.5 g/dL  6.4  6.6   Total Bilirubin 0.0 - 1.2 mg/dL  0.9  2.1   Alkaline Phos 44 - 121 IU/L  140  152   AST 0 - 40 IU/L  32  29   ALT 0 - 32 IU/L  22  21     Lipid Panel     Component Value Date/Time   CHOL 138 10/09/2022 1518   TRIG 60 10/09/2022 1518   HDL 51 10/09/2022 1518   CHOLHDL 3.2 10/03/2020 1639   CHOLHDL 5.2 (H) 02/06/2016 1557   VLDL 42 (H) 02/06/2016 1557   LDLCALC 74 10/09/2022 1518    CBC    Component Value Date/Time   WBC 10.3 06/01/2021 0213   RBC 4.33 06/01/2021 0213   HGB 13.2 06/01/2021 0213   HCT 39.9 06/01/2021 0213   PLT 278 06/01/2021 0213   MCV 92.1 06/01/2021 0213   MCH 30.5 06/01/2021 0213   MCHC 33.1 06/01/2021 0213   RDW 13.1 06/01/2021 0213   LYMPHSABS 3.2 05/31/2021 1735   MONOABS 0.5 05/31/2021 1735   EOSABS 0.3 05/31/2021 1735   BASOSABS 0.0 05/31/2021 1735    Lab Results  Component Value Date   HGBA1C 13.0 (A) 02/03/2024    Lab Results  Component Value Date   HGBA1C 13.0 (A) 02/03/2024   HGBA1C 10.3 (A) 05/28/2023   HGBA1C 7.6 (A) 01/07/2023      1. Type 2 diabetes mellitus with hyperglycemia, without long-term current use of insulin  (HCC) (Primary) Uncontrolled with A1c of 13.0, goal is less than 7.0 She never picked up Ozempic  which was prescribed 8 months ago.  I have communicated with the pharmacy and they do have this ready for her to pick up She will follow-up with the clinical pharmacist in 1 month to uptitrate Ozempic  until blood sugars are at goal Continue current regimen of  Lantus , glipizide , metformin  - POCT glycosylated hemoglobin (Hb A1C) - Microalbumin / creatinine urine ratio - glipiZIDE  (GLUCOTROL ) 10 MG tablet; Take 1 tablet (10 mg total) by mouth 2 (two) times daily.  Dispense: 180 tablet; Refill: 1 - Insulin  Glargine (BASAGLAR  KWIKPEN) 100 UNIT/ML; Inject 35 Units into the skin at bedtime. .  Dispense: 9 mL; Refill: 1 - metFORMIN  (GLUCOPHAGE ) 1000 MG tablet; Take 1 tablet (1,000 mg total) by mouth 2 (two) times daily with a meal.  Dispense: 180 tablet; Refill: 1  2. Hyperlipidemia associated with type 2 diabetes mellitus (HCC) Controlled Low-cholesterol diet Continue statin - atorvastatin  (LIPITOR) 80 MG tablet; Take 1 tablet (80 mg total) by mouth daily.  Dispense: 90 tablet; Refill: 1  3. Primary hypertension Controlled Counseled on blood pressure goal of less than 130/80, low-sodium, DASH diet, medication compliance, 150 minutes of moderate intensity exercise per week. Discussed medication compliance, adverse effects. - lisinopril  (ZESTRIL ) 2.5 MG tablet; Take 1 tablet (2.5 mg total) by mouth daily.  Dispense: 90 tablet; Refill: 1  4. Vaginal candidiasis Due to persistent hyperglycemic state Prescribed Diflucan   5. Plantar fasciitis Advised to use insoles She will benefit from cortisone injection -  Ambulatory referral to Podiatry  6. Bereavement Recently lost her nephew  68. Screening for colon cancer - Fecal occult blood, imunochemical  8. 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    Dispense:  90 tablet    Refill:  1   glipiZIDE  (GLUCOTROL ) 10 MG tablet    Sig: Take 1 tablet (10 mg total)  by mouth 2 (two) times daily.    Dispense:  180 tablet    Refill:  1   Insulin  Glargine (BASAGLAR  KWIKPEN) 100 UNIT/ML    Sig: Inject 35 Units into the skin at bedtime. .    Dispense:  9 mL    Refill:  1   lisinopril  (ZESTRIL ) 2.5 MG tablet    Sig: Take 1 tablet (2.5 mg total) by mouth daily.    Dispense:  90 tablet    Refill:  1   metFORMIN  (GLUCOPHAGE ) 1000 MG tablet    Sig: Take 1 tablet (1,000 mg total) by mouth 2 (two) times daily with a meal.    Dispense:  180 tablet    Refill:  1   fluconazole  (DIFLUCAN ) 150 MG tablet    Sig: Take 1 tablet (150 mg total) by mouth once for 1 dose.    Dispense:  1 tablet    Refill:  0    Follow-up: Return in about 1 month (around 03/04/2024) for Blood pressure follow-up with [place], Medical conditions with PCP, in 3 months.       Joaquin Mulberry, MD, FAAFP. [facility] [place], [place] [phone]   02/03/2024, 4:10 PM

## 2024-02-05 LAB — MICROALBUMIN / CREATININE URINE RATIO
Creatinine, Urine: 153.3 mg/dL
Microalb/Creat Ratio: 28 mg/g{creat} (ref 0–29)
Microalbumin, Urine: 43.3 ug/mL

## 2024-02-06 ENCOUNTER — Ambulatory Visit: Payer: Self-pay | Admitting: Family Medicine

## 2024-02-06 LAB — FECAL OCCULT BLOOD, IMMUNOCHEMICAL: Fecal Occult Bld: NEGATIVE

## 2024-02-17 ENCOUNTER — Ambulatory Visit: Admitting: Podiatry

## 2024-03-05 ENCOUNTER — Encounter: Payer: Self-pay | Admitting: Pharmacist

## 2024-03-05 ENCOUNTER — Ambulatory Visit: Payer: Self-pay | Attending: Family Medicine | Admitting: Pharmacist

## 2024-03-05 ENCOUNTER — Other Ambulatory Visit: Payer: Self-pay

## 2024-03-05 DIAGNOSIS — Z794 Long term (current) use of insulin: Secondary | ICD-10-CM

## 2024-03-05 DIAGNOSIS — E785 Hyperlipidemia, unspecified: Secondary | ICD-10-CM

## 2024-03-05 DIAGNOSIS — E1165 Type 2 diabetes mellitus with hyperglycemia: Secondary | ICD-10-CM

## 2024-03-05 DIAGNOSIS — I10 Essential (primary) hypertension: Secondary | ICD-10-CM

## 2024-03-05 DIAGNOSIS — Z7984 Long term (current) use of oral hypoglycemic drugs: Secondary | ICD-10-CM

## 2024-03-05 DIAGNOSIS — Z7985 Long-term (current) use of injectable non-insulin antidiabetic drugs: Secondary | ICD-10-CM

## 2024-03-05 MED ORDER — OZEMPIC (0.25 OR 0.5 MG/DOSE) 2 MG/3ML ~~LOC~~ SOPN
0.5000 mg | PEN_INJECTOR | SUBCUTANEOUS | 3 refills | Status: DC
  Filled 2024-03-05: qty 3, 28d supply, fill #0
  Filled 2024-03-05: qty 3, fill #0

## 2024-03-05 MED ORDER — TRUE METRIX BLOOD GLUCOSE TEST VI STRP
ORAL_STRIP | 6 refills | Status: AC
  Filled 2024-03-05: qty 100, 33d supply, fill #0
  Filled 2024-04-06: qty 100, 33d supply, fill #1

## 2024-03-05 MED ORDER — OMEPRAZOLE 20 MG PO CPDR
20.0000 mg | DELAYED_RELEASE_CAPSULE | Freq: Every day | ORAL | 3 refills | Status: AC
  Filled 2024-03-05: qty 30, 30d supply, fill #0

## 2024-03-05 MED ORDER — TRUEPLUS LANCETS 28G MISC
6 refills | Status: AC
  Filled 2024-03-05: qty 100, 33d supply, fill #0
  Filled 2024-04-06: qty 100, 33d supply, fill #1

## 2024-03-05 MED ORDER — PEN NEEDLES 32G X 4 MM MISC
2 refills | Status: AC
  Filled 2024-03-05: qty 100, 100d supply, fill #0

## 2024-03-05 MED ORDER — TRUE METRIX METER W/DEVICE KIT
PACK | 0 refills | Status: AC
Start: 2024-03-05 — End: ?
  Filled 2024-03-05: qty 1, 30d supply, fill #0

## 2024-03-05 NOTE — Progress Notes (Signed)
 AMN interpreter used for this visit: Hermelinda [id number]  S:     No chief complaint on file.  68 y.o. female who presents for diabetes evaluation, education, and management. Patient arrives in good spirits and presents without any assistance.  Patient was referred and last seen by Primary Care Provider, Dr. Delbert, on 02/03/2024. A1c at that visit was 13.0 (up from 10.3 prior). At that visit she endorsed adherence to Lantus , metformin , and glipizide . She had not started Ozempic . That was filled and picked-up same day.  PMH is significant for T2DM, hyperlipidemia, panuveitis of both eyes, left eye retinal detachment.   Today, pt reports symptoms of reflux and nausea. Patient reports diabetes is longstanding. Since last month, she has started the Ozempic . She is taking 0.25 mg ONCE DAILY instead of weekly as prescribed. She endorses adherence to glipizide  BID, insulin  at 25 units daily, and metformin  BID.   Family/Social History:  Fhx: no pertinent positives Tobacco: never smoker  Alcohol: none reported   Current diabetes medications include: glipizide  10 mg BID, glargine insulin  35 units daily, metformin  1000 mg BID, Ozempic  0.25 mg weekly (has been taking once daily) Current hypertension medications include: lisinopril  2.5 mg daily Current hyperlipidemia medications include: atorvastatin   80 mg daily  Patient reports adherence to taking all medications as prescribed.   Insurance coverage: none  Patient denies hypoglycemic events.  Reported home fasting blood sugars: not checking  Reported 2 hour post-meal/random blood sugars: not checking.  Patient denies nocturia (nighttime urination).  Patient denies neuropathy (nerve pain). Patient denies visual changes. Patient reports self foot exams.   Patient reported dietary habits: Eats 2 meals/day  Patient-reported exercise habits: none  O:  No CGM or glucometer present. Not checking at home - last meter was from 2019.   Lab Results   Component Value Date   HGBA1C 13.0 (A) 02/03/2024   There were no vitals filed for this visit.  Lipid Panel     Component Value Date/Time   CHOL 138 10/09/2022 1518   TRIG 60 10/09/2022 1518   HDL 51 10/09/2022 1518   CHOLHDL 3.2 10/03/2020 1639   CHOLHDL 5.2 (H) 02/06/2016 1557   VLDL 42 (H) 02/06/2016 1557   LDLCALC 74 10/09/2022 1518    Clinical Atherosclerotic Cardiovascular Disease (ASCVD): No  The 10-year ASCVD risk score (Arnett DK, et al., 2019) is: 11%   Values used to calculate the score:     Age: 54 years     Clincally relevant sex: Female     Is Non-Hispanic African American: No     Diabetic: Yes     Tobacco smoker: No     Systolic Blood Pressure: 123 mmHg     Is BP treated: Yes     HDL Cholesterol: 51 mg/dL     Total Cholesterol: 138 mg/dL   Patient is participating in a Managed Medicaid Plan: No   A/P: Diabetes longstanding currently uncontrolled. Patient is not symptomatic from a hyper- or hypoglycemia standpoint but is experiencing daily nausea and heartburn. Unfortunately, she misunderstood instructions from her last PCP visit and has been taking Ozempic  0.25 mg daily instead of weekly. I'm surprised she's not more symptomatic than this. She denies any vomiting or epigastric pain. She is able to verbalize appropriate hypoglycemia management plan. She is adherent to her insulin , glipizide , and metformin . I spent an extensive amount of time discussing adherence and the need to take Ozempic  once weekly. By the end of the visit, she was able to verbalize  understanding via teach back. -Continued Ozempic . Change to 0.5 mg weekly. Pharmacy downstairs will show her the 0.5 mg dial when she goes to pick-up. -Continue Basaglar , metformin , and glipizide  at current doses.   -True Metrix supplies sent.  -Pen needles sent.  -Patient educated on purpose, proper use, and potential adverse effects of Ozempic .  -Extensively discussed pathophysiology of diabetes, recommended  lifestyle interventions, dietary effects on blood sugar control.  -Counseled on s/sx of and management of hypoglycemia.  -Next A1c anticipated 04/2024.   Written patient instructions provided. Patient verbalized understanding of treatment plan.  Total time in face to face counseling 20 minutes.    Follow-up:  Pharmacist in 1 month PCP clinic visit: 05/05/2024  Herlene Fleeta Morris, PharmD, BCACP, CPP Clinical Pharmacist [facility] [phone]

## 2024-04-03 ENCOUNTER — Telehealth: Payer: Self-pay | Admitting: Family Medicine

## 2024-04-03 NOTE — Telephone Encounter (Signed)
 Confirmed appt for 8/18

## 2024-04-06 ENCOUNTER — Ambulatory Visit: Payer: Self-pay | Attending: Family Medicine | Admitting: Pharmacist

## 2024-04-06 ENCOUNTER — Other Ambulatory Visit: Payer: Self-pay

## 2024-04-06 DIAGNOSIS — Z794 Long term (current) use of insulin: Secondary | ICD-10-CM

## 2024-04-06 DIAGNOSIS — E1165 Type 2 diabetes mellitus with hyperglycemia: Secondary | ICD-10-CM

## 2024-04-06 DIAGNOSIS — Z7985 Long-term (current) use of injectable non-insulin antidiabetic drugs: Secondary | ICD-10-CM

## 2024-04-06 DIAGNOSIS — Z7984 Long term (current) use of oral hypoglycemic drugs: Secondary | ICD-10-CM

## 2024-04-06 MED ORDER — BASAGLAR KWIKPEN 100 UNIT/ML ~~LOC~~ SOPN
30.0000 [IU] | PEN_INJECTOR | Freq: Every day | SUBCUTANEOUS | 2 refills | Status: DC
  Filled 2024-04-06: qty 9, 30d supply, fill #0
  Filled 2024-08-26: qty 9, 30d supply, fill #1

## 2024-04-06 MED ORDER — SEMAGLUTIDE (1 MG/DOSE) 4 MG/3ML ~~LOC~~ SOPN
1.0000 mg | PEN_INJECTOR | SUBCUTANEOUS | 1 refills | Status: DC
  Filled 2024-04-06: qty 3, 28d supply, fill #0
  Filled 2024-08-26: qty 3, 28d supply, fill #1

## 2024-04-06 NOTE — Progress Notes (Unsigned)
 S:    InterpreterBETHA Freddy HOFFMAN No chief complaint on file.  68 y.o. female who presents for diabetes evaluation, education, and management. Patient arrives in good spirits and presents without any assistance.  Patient was referred and last seen by Primary Care Provider, Dr. Delbert, on 02/03/24. Her A1c was a 13.0% that day, which is up from 10.3% in 05/2023. Patient last saw pharmacy on 03/05/24. At last visit, patient reported significant N/V from Ozempic . Patient was taking Ozempic  once daily instead of once weekly. She was taking her other medications as directed. Patient was instructed to Ozempic  0.5 mg once weekly to continue to take her other medications as she was.  PMH is significant for T2DM, hyperlipidemia, panuveitis of both eyes, left eye retinal detachment.   Today, patient states that she is doing well. She reports taking Ozempic  0.5 mg once weekly and is adherent to metformin , glipizide , and insulin . Of note, she uses 36 units of insulin  glargine instead of the prescribed 35 units. Denies any N/V or heartburn with taking Ozempic  weekly. She reports checking her blood sugars at home with reported values below. No changes in diet or exercise. No changes in vision and no reports of neuropathy. Requests refills for medications.   Family/Social History:  Fhx: no pertinent positives Tobacco: never smoker  Alcohol: none reported   Current diabetes medications include: glipizide  10 mg BID, glargine insulin  36 units daily, metformin  1000 mg BID, Ozempic  0.5 mg weekly Current hypertension medications include: lisinopril  2.5 mg daily Current hyperlipidemia medications include: atorvastatin   80 mg daily  Patient reports adherence to taking all medications as prescribed.   Insurance coverage: self-pay  Patient reports hypoglycemic events. She states that she had an episode of hypoglycemia (80s) where she felt dizzy, sweaty, and shaky. She was able check her blood sugar and correct  with drinking a can of Coke.   Reported home fasting blood sugars: 110-120s in the morning  Reported 2 hour post-meal/random blood sugars: N/A  Patient denies nocturia (nighttime urination).  Patient denies neuropathy (nerve pain). Patient denies visual changes. Patient reports self foot exams.   Patient reported dietary habits: Eats 2 meals/day  Patient-reported exercise habits: none  O:   ROS  Physical Exam  Checking BG through glucometer. Readings listed above.  Lab Results  Component Value Date   HGBA1C 13.0 (A) 02/03/2024   There were no vitals filed for this visit.  Lipid Panel     Component Value Date/Time   CHOL 138 10/09/2022 1518   TRIG 60 10/09/2022 1518   HDL 51 10/09/2022 1518   CHOLHDL 3.2 10/03/2020 1639   CHOLHDL 5.2 (H) 02/06/2016 1557   VLDL 42 (H) 02/06/2016 1557   LDLCALC 74 10/09/2022 1518    Clinical Atherosclerotic Cardiovascular Disease (ASCVD): No  The 10-year ASCVD risk score (Arnett DK, et al., 2019) is: 11%   Values used to calculate the score:     Age: 75 years     Clincally relevant sex: Female     Is Non-Hispanic African American: No     Diabetic: Yes     Tobacco smoker: No     Systolic Blood Pressure: 123 mmHg     Is BP treated: Yes     HDL Cholesterol: 51 mg/dL     Total Cholesterol: 138 mg/dL   Patient is participating in a Managed Medicaid Plan: No  A/P: Diabetes longstanding currently uncontrolled with an A1c of 13% from 02/03/24. Patient is not symptomatic from a hyperglycemic standpoint.  While she reports having a hypoglycemic event in the 80s, patient was not clinically low in the 70s or below. Patient is able to verbalize appropriate hypoglycemia management plan. Patient is tolerating Ozempic  0.5 mg weekly and understands instructions. Plan is to increase Ozempic  to 1 mg once weekly and decrease Basaglar  to 30 units. Continue metformin  and glipizide  as directed.  Would like to eventually discontinue glipizide . Hopeful that  next A1c will be promising to take off.  -Decreased dose of basal insulin  Basaglar  (insulin  glargine) from 36 units to 30 units daily in the evening.  -Increased dose of GLP-1 Ozempic  (semaglutide ) from 0.5 mg to 1 mg. Patient and I spent time discussing what to do with current supply of Ozempic . Patient is to finish 0.5 mg (if there is medication left) then start the 1 mg once weekly. Patient verbalizing understanding. -Continued metformin  and glipizide  at current doses  -Patient educated on purpose, proper use, and potential adverse effects of Ozempic . Patient feels like heart burn medication has been helping.  -Extensively discussed pathophysiology of diabetes, recommended lifestyle interventions, dietary effects on blood sugar control.  -Counseled on s/sx of and management of hypoglycemia.  -Next A1c anticipated 04/2024.   Written patient instructions provided. Patient verbalized understanding of treatment plan.  Total time in face to face counseling 30 minutes.    Follow-up:  Pharmacist on 06/08/24 PCP clinic visit on 05/05/24 with Dr. Newlin  Tristyn Pharris, PharmD PGY1 Pharmacy Resident [phone]

## 2024-04-08 ENCOUNTER — Encounter: Payer: Self-pay | Admitting: Pharmacist

## 2024-04-09 ENCOUNTER — Other Ambulatory Visit: Payer: Self-pay

## 2024-04-10 ENCOUNTER — Other Ambulatory Visit: Payer: Self-pay

## 2024-04-29 ENCOUNTER — Other Ambulatory Visit: Payer: Self-pay

## 2024-04-29 LAB — HM DIABETES EYE EXAM

## 2024-05-05 ENCOUNTER — Encounter: Payer: Self-pay | Admitting: Family Medicine

## 2024-05-05 ENCOUNTER — Other Ambulatory Visit: Payer: Self-pay

## 2024-05-05 ENCOUNTER — Ambulatory Visit: Payer: Self-pay | Attending: Family Medicine | Admitting: Family Medicine

## 2024-05-05 VITALS — BP 124/74 | HR 77 | Ht 62.0 in | Wt 151.0 lb

## 2024-05-05 DIAGNOSIS — E1169 Type 2 diabetes mellitus with other specified complication: Secondary | ICD-10-CM

## 2024-05-05 DIAGNOSIS — R110 Nausea: Secondary | ICD-10-CM

## 2024-05-05 DIAGNOSIS — E785 Hyperlipidemia, unspecified: Secondary | ICD-10-CM

## 2024-05-05 DIAGNOSIS — H44113 Panuveitis, bilateral: Secondary | ICD-10-CM

## 2024-05-05 DIAGNOSIS — Z79899 Other long term (current) drug therapy: Secondary | ICD-10-CM

## 2024-05-05 DIAGNOSIS — H3322 Serous retinal detachment, left eye: Secondary | ICD-10-CM

## 2024-05-05 DIAGNOSIS — Z23 Encounter for immunization: Secondary | ICD-10-CM

## 2024-05-05 DIAGNOSIS — I10 Essential (primary) hypertension: Secondary | ICD-10-CM

## 2024-05-05 DIAGNOSIS — K219 Gastro-esophageal reflux disease without esophagitis: Secondary | ICD-10-CM

## 2024-05-05 DIAGNOSIS — Z794 Long term (current) use of insulin: Secondary | ICD-10-CM

## 2024-05-05 DIAGNOSIS — Z7985 Long-term (current) use of injectable non-insulin antidiabetic drugs: Secondary | ICD-10-CM

## 2024-05-05 DIAGNOSIS — E1165 Type 2 diabetes mellitus with hyperglycemia: Secondary | ICD-10-CM

## 2024-05-05 LAB — POCT GLYCOSYLATED HEMOGLOBIN (HGB A1C): HbA1c, POC (controlled diabetic range): 6 % (ref 0.0–7.0)

## 2024-05-05 MED ORDER — ATORVASTATIN CALCIUM 80 MG PO TABS
80.0000 mg | ORAL_TABLET | Freq: Every day | ORAL | 1 refills | Status: DC
  Filled 2024-08-26: qty 90, 90d supply, fill #0

## 2024-05-05 MED ORDER — LISINOPRIL 2.5 MG PO TABS
2.5000 mg | ORAL_TABLET | Freq: Every day | ORAL | 1 refills | Status: DC
  Filled 2024-08-26: qty 90, 90d supply, fill #0

## 2024-05-05 MED ORDER — ONDANSETRON HCL 4 MG PO TABS
4.0000 mg | ORAL_TABLET | Freq: Three times a day (TID) | ORAL | 1 refills | Status: AC | PRN
  Filled 2024-05-05: qty 30, 10d supply, fill #0

## 2024-05-05 MED ORDER — GLIPIZIDE 10 MG PO TABS: 10.0000 mg | ORAL_TABLET | Freq: Two times a day (BID) | ORAL | 1 refills | Status: DC

## 2024-05-05 NOTE — Progress Notes (Signed)
 Subjective:  Patient ID: Margaret Austin, female    DOB: 27-Nov-1955  Age: 68 y.o. MRN: [id number]  CC: Medical Management of Chronic Issues (No appetite)     Discussed the use of AI scribe software for clinical note transcription with the patient, who gave verbal consent to proceed.  History of Present Illness Margaret Austin is a 68 year old female with a history of Diabetes Mellitus type 2, Hyperlipidemia, panuveitis of both eyes, left eye retinal detachment  who presents with nausea and loss of appetite.  Nausea and lack of appetite have persisted for ten days, primarily in the morning, especially during breakfast. There is a sensation of wanting to vomit without actual vomiting. No abdominal cramping, pain, diarrhea, or fever. She requests a refill of her acid reflux medication, which previously alleviated symptoms.  She is on Ozempic  for diabetes, which has reduced her appetite and contributed to weight loss of about 15 pounds in the last 1 year. She is content with her current weight, noting that weight gain leads to blood pressure issues.  She has vision problems, including panuveitis and retinal detachment in the left eye, and takes multiple medications, including Humira injections.  Last seen by ophthalmology last week.  She reports some improvement in vision but still experiences blurriness and dullness.    Past Medical History:  Diagnosis Date   Diabetes mellitus without complication (HCC)     History reviewed. No pertinent surgical history.  Family History  Problem Relation Age of Onset   Autoimmune disease Neg Hx     Social History   Socioeconomic History   Marital status: Single    Spouse name: Not on file   Number of children: Not on file   Years of education: Not on file   Highest education level: Not on file  Occupational History   Not on file  Tobacco Use   Smoking status: Never   Smokeless tobacco: Never  Vaping Use   Vaping status:  Never Used  Substance and Sexual Activity   Alcohol use: No   Drug use: No   Sexual activity: Yes    Birth control/protection: None  Other Topics Concern   Not on file  Social History Narrative   ** Merged History Encounter **       Social Drivers of Corporate investment banker Strain: Not on file  Food Insecurity: Not on file  Transportation Needs: Not on file  Physical Activity: Not on file  Stress: Not on file  Social Connections: Not on file    No Known Allergies  Outpatient Medications Prior to Visit  Medication Sig Dispense Refill   Blood Glucose Monitoring Suppl (TRUE METRIX METER) w/Device KIT Use to check blood sugar 3 times daily. 1 kit 0   fluticasone  (FLONASE ) 50 MCG/ACT nasal spray Place 2 sprays into both nostrils daily. 16 g 5   glucose blood (TRUE METRIX BLOOD GLUCOSE TEST) test strip Use to check blood sugar 3 times daily. 100 each 6   hydrocortisone  2.5 % cream Apply topically 2 (two) times daily. 30 g 0   ibuprofen  (ADVIL ) 600 MG tablet Take 1 tablet (600 mg total) by mouth every 6 (six) hours as needed. 30 tablet 0   Insulin  Glargine (BASAGLAR  KWIKPEN) 100 UNIT/ML Inject 30 Units into the skin at bedtime. . 9 mL 2   Insulin  Pen Needle (PEN NEEDLES) 32G X 4 MM MISC Use as instructed to inject insulin  daily. 100 each 2   metFORMIN  (  GLUCOPHAGE ) 1000 MG tablet Take 1 tablet (1,000 mg total) by mouth 2 (two) times daily with a meal. 180 tablet 1   omeprazole  (PRILOSEC) 20 MG capsule Take 1 capsule (20 mg total) by mouth daily. 30 capsule 3   OVER THE COUNTER MEDICATION Take 1 tablet by mouth daily. Vitamin from spanish store     Semaglutide , 1 MG/DOSE, 4 MG/3ML SOPN Inject 1 mg as directed once a week. 3 mL 1   tiZANidine  (ZANAFLEX ) 4 MG tablet Take 1 tablet (4 mg total) by mouth every 8 (eight) hours as needed. 60 tablet 1   TRUEplus Lancets 28G MISC Use to check blood sugar 3 times daily. 100 each 6   atorvastatin  (LIPITOR) 80 MG tablet Take 1 tablet (80 mg  total) by mouth daily. 90 tablet 1   glipiZIDE  (GLUCOTROL ) 10 MG tablet Take 1 tablet (10 mg total) by mouth 2 (two) times daily. 180 tablet 1   lisinopril  (ZESTRIL ) 2.5 MG tablet Take 1 tablet (2.5 mg total) by mouth daily. 90 tablet 1   No facility-administered medications prior to visit.     ROS Review of Systems  Constitutional:  Positive for appetite change. Negative for activity change.  HENT:  Negative for sinus pressure and sore throat.   Respiratory:  Negative for chest tightness, shortness of breath and wheezing.   Cardiovascular:  Negative for chest pain and palpitations.  Gastrointestinal:  Negative for abdominal distention, abdominal pain and constipation.  Genitourinary: Negative.   Musculoskeletal: Negative.   Psychiatric/Behavioral:  Negative for behavioral problems and dysphoric mood.     Objective:  BP 124/74   Pulse 77   Ht 5' 2 (1.575 m)   Wt 151 lb (68.5 kg)   SpO2 100%   BMI 27.62 kg/m      05/05/2024    3:32 PM 02/03/2024    3:22 PM 11/26/2023   12:06 PM  BP/Weight  Systolic BP 124 123 165  Diastolic BP 74 75 71  Wt. (Lbs) 151 155.6   BMI 27.62 kg/m2 28.46 kg/m2     Wt Readings from Last 3 Encounters:  05/05/24 151 lb (68.5 kg)  02/03/24 155 lb 9.6 oz (70.6 kg)  05/28/23 166 lb 3.2 oz (75.4 kg)      Physical Exam Constitutional:      Appearance: She is well-developed.  Cardiovascular:     Rate and Rhythm: Normal rate.     Heart sounds: Normal heart sounds. No murmur heard. Pulmonary:     Effort: Pulmonary effort is normal.     Breath sounds: Normal breath sounds. No wheezing or rales.  Chest:     Chest wall: No tenderness.  Abdominal:     General: Bowel sounds are normal. There is no distension.     Palpations: Abdomen is soft. There is no mass.     Tenderness: There is no abdominal tenderness.  Musculoskeletal:        General: Normal range of motion.     Right lower leg: No edema.     Left lower leg: No edema.  Neurological:      Mental Status: She is alert and oriented to person, place, and time.  Psychiatric:        Mood and Affect: Mood normal.        Latest Ref Rng & Units 05/28/2023    2:12 PM 11/13/2022    2:59 PM 10/09/2022    3:18 PM  CMP  Glucose 70 - 99 mg/dL 83  824  73   BUN 8 - 27 mg/dL 9  10  7    Creatinine 0.57 - 1.00 mg/dL 9.37  9.42  9.53   Sodium 134 - 144 mmol/L 142  139  142   Potassium 3.5 - 5.2 mmol/L 4.1  4.1  3.8   Chloride 96 - 106 mmol/L 102  103  105   CO2 20 - 29 mmol/L 22  22  22    Calcium  8.7 - 10.3 mg/dL 9.0  9.0  9.0   Total Protein 6.0 - 8.5 g/dL  6.4  6.6   Total Bilirubin 0.0 - 1.2 mg/dL  0.9  2.1   Alkaline Phos 44 - 121 IU/L  140  152   AST 0 - 40 IU/L  32  29   ALT 0 - 32 IU/L  22  21     Lipid Panel     Component Value Date/Time   CHOL 138 10/09/2022 1518   TRIG 60 10/09/2022 1518   HDL 51 10/09/2022 1518   CHOLHDL 3.2 10/03/2020 1639   CHOLHDL 5.2 (H) 02/06/2016 1557   VLDL 42 (H) 02/06/2016 1557   LDLCALC 74 10/09/2022 1518    CBC    Component Value Date/Time   WBC 10.3 06/01/2021 0213   RBC 4.33 06/01/2021 0213   HGB 13.2 06/01/2021 0213   HCT 39.9 06/01/2021 0213   PLT 278 06/01/2021 0213   MCV 92.1 06/01/2021 0213   MCH 30.5 06/01/2021 0213   MCHC 33.1 06/01/2021 0213   RDW 13.1 06/01/2021 0213   LYMPHSABS 3.2 05/31/2021 1735   MONOABS 0.5 05/31/2021 1735   EOSABS 0.3 05/31/2021 1735   BASOSABS 0.0 05/31/2021 1735    Lab Results  Component Value Date   HGBA1C 6.0 05/05/2024    Lab Results  Component Value Date   HGBA1C 6.0 05/05/2024   HGBA1C 13.0 (A) 02/03/2024   HGBA1C 10.3 (A) 05/28/2023        Assessment & Plan Nausea and decreased appetite Symptoms have been present for 10 days Possibility of viral illness versus GERD GLP-1 RA could also cause nausea but she has been on this for over 3 months - Prescribe medication for nausea as needed.  Type 2 diabetes mellitus with hyperglycemia, on GLP-1 agonist therapy Diabetes  well-controlled with semaglutide , A1c reduced from 13.0 to 6.0. Patient prefers to maintain current weight. - Continue current diabetes management with semaglutide .  Gastroesophageal reflux disease Recurrent acid reflux managed with omeprazole . - Refill omeprazole  prescription.  Primary hypertension Blood pressure well-controlled with current regimen. - Continue current antihypertensive medication.  Hyperlipidemia associated with type 2 diabetes mellitus Cholesterol levels within normal range with treatment. - Continue current lipid-lowering therapy.  Panuveitis, bilateral, under treatment/ Retinal detachment, left eye, under treatment Ongoing treatment with oral medications and Humira. Vision slightly blurry, expected improvement per ophthalmologist. - Continue current ophthalmologic treatment and follow-up. -Under treatment with recent bilateral cataract extraction. Ongoing follow-up for retinal issues. - Continue follow-up with ophthalmology for retinal detachment management.       Meds ordered this encounter  Medications   atorvastatin  (LIPITOR) 80 MG tablet    Sig: Take 1 tablet (80 mg total) by mouth daily.    Dispense:  90 tablet    Refill:  1   glipiZIDE  (GLUCOTROL ) 10 MG tablet    Sig: Take 1 tablet (10 mg total) by mouth 2 (two) times daily.    Dispense:  180 tablet    Refill:  1   lisinopril  (ZESTRIL ) 2.5  MG tablet    Sig: Take 1 tablet (2.5 mg total) by mouth daily.    Dispense:  90 tablet    Refill:  1   ondansetron  (ZOFRAN ) 4 MG tablet    Sig: Take 1 tablet (4 mg total) by mouth every 8 (eight) hours as needed for nausea or vomiting.    Dispense:  30 tablet    Refill:  1    Follow-up: Return in about 3 months (around 08/04/2024) for Chronic medical conditions.       Corrina Sabin, MD, FAAFP. [facility] [place], [place] [phone]   05/05/2024, 5:13 PM

## 2024-05-05 NOTE — Patient Instructions (Signed)
 Nausea, Adult Nausea is the feeling of having an upset stomach or that you are about to vomit. Nausea on its own is not usually a serious concern, but it may be an early sign of a more serious medical problem. As nausea gets worse, it can lead to vomiting. If vomiting develops, or if you are not able to drink enough fluids, you are at risk of becoming dehydrated. Dehydration can make you tired and thirsty, cause you to have a dry mouth, and decrease how often you urinate. Older adults and people with other diseases or a weak disease-fighting system (immune system) are at higher risk for dehydration. The main goals of treating your nausea are: To relieve your nausea. To limit repeated nausea episodes. To prevent vomiting and dehydration. Follow these instructions at home: Watch your symptoms for any changes. Tell your health care provider about them. Eating and drinking     Take an oral rehydration solution (ORS). This is a drink that is sold at pharmacies and retail stores. Drink clear fluids slowly and in small amounts as you are able. Clear fluids include water, ice chips, low-calorie sports drinks, and fruit juice that has water added (diluted fruit juice). Eat bland, easy-to-digest foods in small amounts as you are able. These foods include bananas, applesauce, rice, lean meats, toast, and crackers. Avoid drinking fluids that contain a lot of sugar or caffeine, such as energy drinks, sports drinks, and soda. Avoid alcohol. Avoid spicy or fatty foods. General instructions Take over-the-counter and prescription medicines only as told by your health care provider. Rest at home while you recover. Drink enough fluid to keep your urine pale yellow. Breathe slowly and deeply when you feel nauseous. Avoid smelling things that have strong odors. Wash your hands often using soap and water for at least 20 seconds. If soap and water are not available, use hand sanitizer. Make sure that everyone in  your household washes their hands well and often. Keep all follow-up visits. This is important. Contact a health care provider if: Your nausea gets worse. Your nausea does not go away after two days. You vomit multiple times. You cannot drink fluids without vomiting. You have any of the following: New symptoms. A fever. A headache. Muscle cramps. A rash. Pain while urinating. You feel light-headed or dizzy. Get help right away if: You have pain in your chest, neck, arm, or jaw. You feel extremely weak or you faint. You have vomit that is bright red or looks like coffee grounds. You have bloody or black stools (feces) or stools that look like tar. You have a severe headache, a stiff neck, or both. You have severe pain, cramping, or bloating in your abdomen. You have difficulty breathing or are breathing very quickly. Your heart is beating very quickly. Your skin feels cold and clammy. You feel confused. You have signs of dehydration, such as: Dark urine, very little urine, or no urine. Cracked lips. Dry mouth. Sunken eyes. Sleepiness. Weakness. These symptoms may be an emergency. Get help right away. Call 911. Do not wait to see if the symptoms will go away. Do not drive yourself to the hospital. Summary Nausea is the feeling that you have an upset stomach or that you are about to vomit. Nausea on its own is not usually a serious concern, but it may be an early sign of a more serious medical problem. If vomiting develops, or if you are not able to drink enough fluids, you are at risk of becoming  dehydrated. Follow recommendations for eating and drinking and take over-the-counter and prescription medicines only as told by your health care provider. Contact a health care provider right away if your symptoms worsen or you have new symptoms. Keep all follow-up visits. This is important. This information is not intended to replace advice given to you by your health care provider.  Make sure you discuss any questions you have with your health care provider. Document Revised: 02/10/2021 Document Reviewed: 02/10/2021 Elsevier Patient Education  2024 [employer].

## 2024-05-06 ENCOUNTER — Ambulatory Visit: Payer: Self-pay | Admitting: Family Medicine

## 2024-05-06 ENCOUNTER — Other Ambulatory Visit: Payer: Self-pay

## 2024-05-06 DIAGNOSIS — R110 Nausea: Secondary | ICD-10-CM

## 2024-05-06 DIAGNOSIS — D649 Anemia, unspecified: Secondary | ICD-10-CM

## 2024-05-06 DIAGNOSIS — R634 Abnormal weight loss: Secondary | ICD-10-CM

## 2024-05-06 LAB — CBC WITH DIFFERENTIAL/PLATELET
Basophils Absolute: 0 x10E3/uL (ref 0.0–0.2)
Basos: 1 %
EOS (ABSOLUTE): 0 x10E3/uL (ref 0.0–0.4)
Eos: 1 %
Hematocrit: 25 % — ABNORMAL LOW (ref 34.0–46.6)
Hemoglobin: 8.8 g/dL — ABNORMAL LOW (ref 11.1–15.9)
Immature Grans (Abs): 0 x10E3/uL (ref 0.0–0.1)
Immature Granulocytes: 0 %
Lymphocytes Absolute: 1.6 x10E3/uL (ref 0.7–3.1)
Lymphs: 43 %
MCH: 39.6 pg — ABNORMAL HIGH (ref 26.6–33.0)
MCHC: 35.2 g/dL (ref 31.5–35.7)
MCV: 113 fL — ABNORMAL HIGH (ref 79–97)
Monocytes Absolute: 0.2 x10E3/uL (ref 0.1–0.9)
Monocytes: 5 %
NRBC: 1 % — ABNORMAL HIGH (ref 0–0)
Neutrophils Absolute: 2 x10E3/uL (ref 1.4–7.0)
Neutrophils: 50 %
Platelets: 272 x10E3/uL (ref 150–450)
RBC: 2.22 x10E6/uL — CL (ref 3.77–5.28)
WBC: 3.8 x10E3/uL (ref 3.4–10.8)

## 2024-05-06 LAB — CMP14+EGFR
ALT: 23 IU/L (ref 0–32)
AST: 36 IU/L (ref 0–40)
Albumin: 4 g/dL (ref 3.9–4.9)
Alkaline Phosphatase: 138 IU/L — ABNORMAL HIGH (ref 49–135)
BUN/Creatinine Ratio: 20 (ref 12–28)
BUN: 16 mg/dL (ref 8–27)
Bilirubin Total: 2 mg/dL — ABNORMAL HIGH (ref 0.0–1.2)
CO2: 22 mmol/L (ref 20–29)
Calcium: 9.3 mg/dL (ref 8.7–10.3)
Chloride: 103 mmol/L (ref 96–106)
Creatinine, Ser: 0.8 mg/dL (ref 0.57–1.00)
Globulin, Total: 2.7 g/dL (ref 1.5–4.5)
Glucose: 106 mg/dL — ABNORMAL HIGH (ref 70–99)
Potassium: 4.3 mmol/L (ref 3.5–5.2)
Sodium: 138 mmol/L (ref 134–144)
Total Protein: 6.7 g/dL (ref 6.0–8.5)
eGFR: 82 mL/min/1.73 (ref >59)

## 2024-05-06 MED ORDER — IRON (FERROUS SULFATE) 325 (65 FE) MG PO TABS
325.0000 mg | ORAL_TABLET | Freq: Two times a day (BID) | ORAL | 1 refills | Status: AC
  Filled 2024-05-06: qty 60, 30d supply, fill #0

## 2024-05-13 ENCOUNTER — Ambulatory Visit (HOSPITAL_COMMUNITY)
Admission: RE | Admit: 2024-05-13 | Discharge: 2024-05-13 | Disposition: A | Discharge status: Home / Self Care | Payer: Self-pay | Source: Ambulatory Visit | Attending: Family Medicine | Admitting: Family Medicine

## 2024-05-13 DIAGNOSIS — R110 Nausea: Secondary | ICD-10-CM | POA: Insufficient documentation

## 2024-05-13 DIAGNOSIS — D649 Anemia, unspecified: Secondary | ICD-10-CM | POA: Insufficient documentation

## 2024-05-13 DIAGNOSIS — R634 Abnormal weight loss: Secondary | ICD-10-CM | POA: Insufficient documentation

## 2024-05-13 MED ORDER — SODIUM CHLORIDE (PF) 0.9 % IJ SOLN
INTRAMUSCULAR | Status: AC
  Filled 2024-05-13: qty 50

## 2024-05-13 MED ORDER — IOHEXOL 300 MG/ML  SOLN
100.0000 mL | Freq: Once | INTRAMUSCULAR | Status: AC | PRN
  Administered 2024-05-13: 100 mL via INTRAVENOUS

## 2024-05-15 ENCOUNTER — Other Ambulatory Visit: Payer: Self-pay

## 2024-05-18 ENCOUNTER — Encounter: Payer: Self-pay | Admitting: Family Medicine

## 2024-05-19 ENCOUNTER — Ambulatory Visit: Payer: Self-pay | Admitting: Family Medicine

## 2024-05-19 DIAGNOSIS — K8020 Calculus of gallbladder without cholecystitis without obstruction: Secondary | ICD-10-CM

## 2024-05-25 ENCOUNTER — Ambulatory Visit (INDEPENDENT_AMBULATORY_CARE_PROVIDER_SITE_OTHER): Payer: Self-pay | Admitting: Surgery

## 2024-05-25 ENCOUNTER — Encounter: Payer: Self-pay | Admitting: Surgery

## 2024-05-25 VITALS — BP 128/74 | HR 76 | Temp 98.7°F | Wt 146.4 lb

## 2024-05-25 DIAGNOSIS — K8020 Calculus of gallbladder without cholecystitis without obstruction: Secondary | ICD-10-CM

## 2024-05-25 DIAGNOSIS — R112 Nausea with vomiting, unspecified: Secondary | ICD-10-CM

## 2024-05-25 DIAGNOSIS — K219 Gastro-esophageal reflux disease without esophagitis: Secondary | ICD-10-CM

## 2024-05-25 NOTE — Progress Notes (Signed)
 05/25/2024  Reason for Visit: Cholelithiasis  Requesting Provider: Corrina Sabin, MD  History of Present Illness: Margaret Austin is a 68 y.o. female presenting for evaluation of cholelithiasis.  The patient has a history of diabetes and is on both insulin  and Ozempic .  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  She has a history of GERD as well.  She reports that when starting Ozempic , she started having nausea, vomiting, and reflux.  Her nausea and vomiting would occur about 15 minutes after po intake, and would happen with any po intake.  It was noted In July 2025 that she had been taking the Ozempic  once daily instead of once weekly.  Her dosage was adjusted to once weekly and the patient reports that her symptoms improved but have still continued.  Her dosage had been 0.25 mg and was increased to 0.5 and is now on 1 mg once weekly.  Her A1c has significantly improved and is down to 6.0 from 13 almost 4 months ago.  She also losing weight.    Her most recent labs on 05/05/24 showed a Hgb of 8.8, total bilirubin of 2, AST 36, ALT 23, and Alk Phos 138.  She had a CT scan of abdomen and pelvis which did not see any gross masses or lymphadenopathy, but showed cholelithiasis.  On further chart review, she also had a CT abdomen/pelvis in 2014 which showed the same cholelithiasis.    She reports ongoing nausea and vomiting, although she has not had emesis in the last few days, only nausea.  She still has issues eating and feels that food remains stuck in her upper abdomen.  She continues having reflux.  She was taking a prescription PPI but she completed 2 weeks and did not continue any further doses.  She also reports constipation.  Past Medical History: Past Medical History:  Diagnosis Date   Diabetes mellitus without complication (HCC)      Past Surgical History: No past surgical history on file.  Home Medications: Prior to Admission  medications   Medication Sig Start Date End Date Taking? Authorizing Provider  atorvastatin  (LIPITOR) 80 MG tablet Take 1 tablet (80 mg total) by mouth daily. 05/05/24  Yes Newlin, Enobong, MD  Blood Glucose Monitoring Suppl (TRUE METRIX METER) w/Device KIT Use to check blood sugar 3 times daily. 03/05/24  Yes Newlin, Enobong, MD  ibuprofen  (ADVIL ) 600 MG tablet Take 1 tablet (600 mg total) by mouth every 6 (six) hours as needed. 11/26/23  Yes Rising, Asberry, PA-C  Insulin  Glargine (BASAGLAR  KWIKPEN) 100 UNIT/ML Inject 30 Units into the skin at bedtime. . 04/06/24  Yes Newlin, Enobong, MD  Insulin  Pen Needle (PEN NEEDLES) 32G X 4 MM MISC Use as instructed to inject insulin  daily. 03/05/24  Yes Newlin, Enobong, MD  metFORMIN  (GLUCOPHAGE ) 1000 MG tablet Take 1 tablet (1,000 mg total) by mouth 2 (two) times daily with a meal. 02/03/24  Yes Newlin, Enobong, MD  omeprazole  (PRILOSEC) 20 MG capsule Take 1 capsule (20 mg total) by mouth daily. 03/05/24  Yes Newlin, Enobong, MD  ondansetron  (ZOFRAN ) 4 MG tablet Take 1 tablet (4 mg total) by mouth every 8 (eight) hours as needed for nausea or vomiting. 05/05/24  Yes Newlin, Enobong, MD  OVER THE COUNTER MEDICATION Take 1 tablet by mouth daily. Vitamin from [employer] store   Yes [provider]  Semaglutide , 1 MG/DOSE, 4 MG/3ML SOPN Inject 1 mg as directed once a week. 04/06/24  Yes Newlin, Enobong, MD  fluticasone  (FLONASE ) 50 MCG/ACT nasal spray Place 2 sprays into both nostrils daily. Patient not taking: Reported on 05/25/2024 09/12/17   Newlin, Enobong, MD  glipiZIDE  (GLUCOTROL ) 10 MG tablet Take 1 tablet (10 mg total) by mouth 2 (two) times daily. Patient not taking: Reported on 05/25/2024 05/05/24   Newlin, Enobong, MD  glucose blood (TRUE METRIX BLOOD GLUCOSE TEST) test strip Use to check blood sugar 3 times daily. Patient not taking: Reported on 05/25/2024 03/05/24   Newlin, Enobong, MD  hydrocortisone  2.5 % cream Apply topically 2 (two) times  daily. Patient not taking: Reported on 05/25/2024 03/12/17   Newlin, Enobong, MD  Iron , Ferrous Sulfate , 325 (65 Fe) MG TABS Take 325 mg by mouth 2 (two) times daily. Patient not taking: Reported on 05/25/2024 05/06/24   Newlin, Enobong, MD  lisinopril  (ZESTRIL ) 2.5 MG tablet Take 1 tablet (2.5 mg total) by mouth daily. Patient not taking: Reported on 05/25/2024 05/05/24   Newlin, Enobong, MD  tiZANidine  (ZANAFLEX ) 4 MG tablet Take 1 tablet (4 mg total) by mouth every 8 (eight) hours as needed. Patient not taking: Reported on 05/25/2024 10/09/22   Newlin, Enobong, MD  TRUEplus Lancets 28G MISC Use to check blood sugar 3 times daily. Patient not taking: Reported on 05/25/2024 03/05/24   Newlin, Enobong, MD    Allergies: No Known Allergies  Social History:  reports that she has never smoked. She has never used smokeless tobacco. She reports that she does not drink alcohol and does not use drugs.   Family History: Family History  Problem Relation Age of Onset   Autoimmune disease Neg Hx     Review of Systems: Review of Systems  Constitutional:  Negative for chills and fever.  Respiratory:  Negative for shortness of breath.   Cardiovascular:  Negative for chest pain.  Gastrointestinal:  Positive for constipation, heartburn, nausea and vomiting. Negative for abdominal pain.    Physical Exam BP 128/74   Pulse 76   Temp 98.7 F (37.1 C)   Wt 146 lb 6.4 oz (66.4 kg)   SpO2 99%   BMI 26.78 kg/m  CONSTITUTIONAL: No acute distress HEENT:  Normocephalic, atraumatic, extraocular motion intact. RESPIRATORY:  Lungs are clear, and breath sounds are equal bilaterally. Normal respiratory effort without pathologic use of accessory muscles. CARDIOVASCULAR: Heart is regular without murmurs, gallops, or rubs. GI: The abdomen is soft, nondistended, with some soreness to palpation in all quadrants but no peritonitis and a negative Murphy's sign.  MUSCULOSKELETAL:  Normal muscle strength and tone in all  four extremities.  No peripheral edema or cyanosis. NEUROLOGIC:  Motor and sensation is grossly normal.  Cranial nerves are grossly intact. PSYCH:  Alert and oriented to person, place and time. Affect is normal.  Laboratory Analysis: Labs from 05/05/2024: Sodium 138, potassium 4.3, chloride 103, CO2 22, BUN 16, creatinine 0.8.  Total bilirubin 2.0, AST 36, ALT 23, alkaline phosphatase 138.  WBC 3.8, hemoglobin 8.8, hematocrit 25, platelets 272.  Hemoglobin A1c 6.0  Imaging: CT abdomen/pelvis on 05/13/2024: IMPRESSION: 1. No acute abdominopelvic findings. 2. Cholelithiasis without evidence of acute cholecystitis.  Assessment and Plan: This is a 68 y.o. female with cholelithiasis.  -The patient is having multiple ongoing issues at the time.  Discussed with her the typical symptoms of cholelithiasis including epigastric or right upper quadrant pain after meals, nausea, vomiting, pain that radiates to the back.  The patient denies any pain at all, although on exam mentions discomfort in all  quadrants to palpation.  Given that her symptoms improved after correcting the administration frequency of Ozempic , it could be that she has a combination of side effects from Ozempic , GERD, and gastroparesis from her diabetes.  I do not think this is truly associated with cholelithiasis, as she has had cholelithiasis since at least 2014 but just recently is having these symptoms and her symptoms are not typical for biliary colic.  On her most recent CT scan, her stomach is also distended and full of food, but she reports being fasting for that CT scan.   --Unfortunately her history is all over the place and is hard to get accurate timeline with her.  Nonetheless, I suspect her issues are not related to her gallbladder.  Have placed a referral for GI for further evaluation.  It may be that she has to come off Ozempic  or decrease the dosage, she may need an EGD or gastric emptying study.  Her Hgb is also low and she  might need a colonoscopy as well. --Have also recommended that she start taking Omeprazole  again to help with her GERD, work on constipation issues by taking Miralax  daily, avoiding acidic/citric foods. --Follow up as needed.  I spent 30 minutes dedicated to the care of this patient on the date of this encounter to include pre-visit review of records, face-to-face time with the patient discussing diagnosis and management, and any post-visit coordination of care.   Aloysius Sheree Plant, MD [facility]

## 2024-05-25 NOTE — Patient Instructions (Addendum)
 Estreimiento, en adultos Constipation, Adult El estreimiento se produce cuando una persona tiene problemas para defecar (hacer sus deposiciones). Cuando tiene esta afeccin, el nmero de deposiciones es inferior a 3 veces por semana. Las deposiciones (heces) tambin pueden ser secas, duras o ms voluminosas de lo normal. Siga estas instrucciones en su casa: Comida y bebida  Consuma alimentos con alto contenido de fibra, por ejemplo: Frutas y verduras frescas. Cereales integrales. Frijoles. Consuma menos alimentos bajos en fibra y con alto contenido de grasas y azcar, como: Papas fritas. Hamburguesas. Galletas dulces. Caramelos. Gaseosas. Beba suficiente lquido para Radio producer pis (orina) de color amarillo plido. Instrucciones generales Haga actividad fsica con regularidad o segn las indicaciones del mdico. Intente practicar 150 minutos de actividad fsica por semana. Vaya al bao cuando sienta la necesidad de defecar. No se aguante las ganas. Use los medicamentos de venta libre y los recetados solamente como se lo haya indicado el mdico. Estos incluyen los suplementos de [place]. Cuando est defecando: Respire profundamente mientras relaja la parte inferior del vientre (abdomen). Relaje el suelo plvico. El suelo plvico est formado por un grupo de msculos que sostienen el recto, la vejiga y los intestinos (as como el tero en las mujeres). Controle su afeccin para Insurance risk surveyor cambio. Visite al mdico si advierte lo indicado. Concurra a todas las visitas de [address] se lo haya indicado el mdico. Esto es importante. Comunquese con un mdico si: Su dolor empeora. Tiene fiebre. No ha defecado por 4 das. Vomita. No tiene hambre. Pierde peso. Tiene sangrado por la abertura entre las nalgas (ano). Las deposiciones son delgadas como un lpiz. Solicite ayuda de inmediato si: Althea duran, y los sntomas empeoran de repente. Tiene prdida de materia fecal u  observa [employer]. Siente el vientre ms duro o ms grande de lo normal (hinchado). Siente un dolor muy intenso en el vientre. Se siente mareado o se desmaya. Resumen El estreimiento se produce cuando una persona defeca menos de 3 veces a la semana, tiene problemas para defecar o las heces son secas, duras o ms grandes que lo normal. Consuma alimentos con un alto contenido de [place]. Beba suficiente lquido para Radio producer pis (orina) de color amarillo plido. Use los medicamentos de venta libre y los recetados solamente como se lo haya indicado el mdico. Estos incluyen los suplementos de [place]. Esta informacin no tiene Theme park manager el consejo del mdico. Asegrese de hacerle al mdico cualquier pregunta que tenga. Document Revised: 09/11/2019 Document Reviewed: 06/20/2022 Elsevier Patient Education  2024 Elsevier Inc.Please take once a day over the counter Prilosec or omeprazole .  For constipation take over the counter Miralax  powder.  Referral has been placed to GI

## 2024-06-07 NOTE — Progress Notes (Deleted)
 S:    InterpreterBETHA [name] No chief complaint on file.  68 y.o. female who presents for diabetes evaluation, education, and management. PMH is significant for T2DM, hyperlipidemia, panuveitis of both eyes, left eye retinal detachment. Patient was referred by Primary Care Provider, Dr. Delbert, on 02/03/24. Her A1c was 13.0% that day, which was up from 10.3% in 05/2023.   Patient has been followed by pharmacy for some time. She saw pharmacy 03/05/24 with reports of nausea with GERD symptoms from taking Ozempic  daily instead of weekly. Last saw pharmacy on 04/06/24. Lantus  decreased to 30 units and Ozempic  increased 1 mg weekly.  Patient last saw Dr. Newlin on 05/05/24. A1c that day resulted at 6.0% which is down from 13.0% back in June! Medication regimen remained unchanged.  Today, patient states that she is doing well.   Tolerating ozempic ? Tolerating insulin ? Hypo? Hyper? Bg readings?  Keep everything the same - follow up in 2 months   Family/Social History:  Fhx: no pertinent positives Tobacco: never smoker  Alcohol: none reported   Current diabetes medications include: glipizide  10 mg BID, glargine insulin  36 units daily, metformin  1000 mg BID, Ozempic  0.5 mg weekly Current hypertension medications include: lisinopril  2.5 mg daily Current hyperlipidemia medications include: atorvastatin   80 mg daily  Patient reports adherence to taking all medications as prescribed.   Insurance coverage: self-pay  Patient reports hypoglycemic events. She states that she had an episode of hypoglycemia (80s) where she felt dizzy, sweaty, and shaky. She was able check her blood sugar and correct with drinking a can of Coke.   Reported home fasting blood sugars: 110-120s in the morning  Reported 2 hour post-meal/random blood sugars: N/A  Patient denies nocturia (nighttime urination).  Patient denies neuropathy (nerve pain). Patient denies visual changes. Patient reports self foot  exams.   Patient reported dietary habits: Eats 2 meals/day  Patient-reported exercise habits: none  O:   ROS  Physical Exam  Checking BG through glucometer. Readings listed above.  Lab Results  Component Value Date   HGBA1C 6.0 05/05/2024   There were no vitals filed for this visit.  Lipid Panel     Component Value Date/Time   CHOL 138 10/09/2022 1518   TRIG 60 10/09/2022 1518   HDL 51 10/09/2022 1518   CHOLHDL 3.2 10/03/2020 1639   CHOLHDL 5.2 (H) 02/06/2016 1557   VLDL 42 (H) 02/06/2016 1557   LDLCALC 74 10/09/2022 1518    Clinical Atherosclerotic Cardiovascular Disease (ASCVD): No  The 10-year ASCVD risk score (Arnett DK, et al., 2019) is: 11.9%   Values used to calculate the score:     Age: 70 years     Clincally relevant sex: Female     Is Non-Hispanic African American: No     Diabetic: Yes     Tobacco smoker: No     Systolic Blood Pressure: 128 mmHg     Is BP treated: Yes     HDL Cholesterol: 51 mg/dL     Total Cholesterol: 138 mg/dL   Patient is participating in a Managed Medicaid Plan: No  A/P: Diabetes longstanding currently uncontrolled with an A1c of 13% from 02/03/24. Patient is not symptomatic from a hyperglycemic standpoint. While she reports having a hypoglycemic event in the 80s, patient with no objective values under 70. Patient is able to verbalize appropriate hypoglycemia management plan. Patient is tolerating Ozempic  0.5 mg weekly and understands instructions. Plan is to increase Ozempic  to 1 mg once weekly and decrease Basaglar   to 30 units. Continue metformin  and glipizide  as directed.  Would like to eventually discontinue glipizide .  -Decreased dose of basal insulin  Basaglar  (insulin  glargine) from 36 units to 30 units daily in the evening.  -Increased dose of GLP-1 Ozempic  (semaglutide ) from 0.5 mg to 1 mg. Patient and I spent time discussing what to do with current supply of Ozempic . Patient is to finish 0.5 mg (if there is medication left)  then start the 1 mg once weekly. Patient verbalizing understanding. -Continued metformin  and glipizide  at current doses  -Patient educated on purpose, proper use, and potential adverse effects of Ozempic . Patient feels like heart burn medication has been helping.  -Extensively discussed pathophysiology of diabetes, recommended lifestyle interventions, dietary effects on blood sugar control.  -Counseled on s/sx of and management of hypoglycemia.  -Next A1c anticipated 04/2024.   Written patient instructions provided. Patient verbalized understanding of treatment plan.  Total time in face to face counseling 30 minutes.    Follow-up:  Pharmacist on 06/08/24 PCP clinic visit on 05/05/24 with Dr. Newlin  [name], PharmD PGY1 Pharmacy Resident [phone]

## 2024-06-08 ENCOUNTER — Ambulatory Visit: Payer: Self-pay | Admitting: Pharmacist

## 2024-08-04 ENCOUNTER — Ambulatory Visit: Payer: Self-pay | Admitting: Family Medicine

## 2024-08-26 ENCOUNTER — Other Ambulatory Visit: Payer: Self-pay

## 2024-08-26 ENCOUNTER — Telehealth: Payer: Self-pay

## 2024-08-26 ENCOUNTER — Other Ambulatory Visit: Payer: Self-pay | Admitting: Family Medicine

## 2024-08-26 DIAGNOSIS — E1165 Type 2 diabetes mellitus with hyperglycemia: Secondary | ICD-10-CM

## 2024-08-26 MED ORDER — METFORMIN HCL 1000 MG PO TABS
1000.0000 mg | ORAL_TABLET | Freq: Two times a day (BID) | ORAL | 0 refills | Status: DC
  Filled 2024-08-26: qty 60, 30d supply, fill #0

## 2024-08-26 NOTE — Telephone Encounter (Signed)
 Patient requesting refills on metFORMIN  (GLUCOPHAGE ) 1000 MG tablet

## 2024-08-27 NOTE — Telephone Encounter (Signed)
 Refill has been sent.

## 2024-09-04 ENCOUNTER — Telehealth: Payer: Self-pay | Admitting: Family Medicine

## 2024-09-04 ENCOUNTER — Other Ambulatory Visit: Payer: Self-pay

## 2024-09-04 NOTE — Telephone Encounter (Signed)
 Pt confirmed appt

## 2024-09-07 ENCOUNTER — Ambulatory Visit: Payer: Self-pay | Attending: Family Medicine | Admitting: Family Medicine

## 2024-09-07 ENCOUNTER — Other Ambulatory Visit: Payer: Self-pay

## 2024-09-07 ENCOUNTER — Other Ambulatory Visit: Payer: Self-pay | Admitting: Pharmacist

## 2024-09-07 ENCOUNTER — Encounter: Payer: Self-pay | Admitting: Family Medicine

## 2024-09-07 VITALS — BP 153/82 | HR 72 | Temp 98.1°F | Ht 62.0 in | Wt 151.4 lb

## 2024-09-07 DIAGNOSIS — I10 Essential (primary) hypertension: Secondary | ICD-10-CM

## 2024-09-07 DIAGNOSIS — M25552 Pain in left hip: Secondary | ICD-10-CM

## 2024-09-07 DIAGNOSIS — E1169 Type 2 diabetes mellitus with other specified complication: Secondary | ICD-10-CM

## 2024-09-07 DIAGNOSIS — E1165 Type 2 diabetes mellitus with hyperglycemia: Secondary | ICD-10-CM

## 2024-09-07 DIAGNOSIS — E785 Hyperlipidemia, unspecified: Secondary | ICD-10-CM

## 2024-09-07 LAB — POCT GLYCOSYLATED HEMOGLOBIN (HGB A1C): HbA1c, POC (controlled diabetic range): 6.1 % (ref 0.0–7.0)

## 2024-09-07 MED ORDER — BASAGLAR KWIKPEN 100 UNIT/ML ~~LOC~~ SOPN
30.0000 [IU] | PEN_INJECTOR | Freq: Every day | SUBCUTANEOUS | 2 refills | Status: AC
  Filled 2024-09-07: qty 9, 30d supply, fill #0

## 2024-09-07 MED ORDER — GLIPIZIDE 10 MG PO TABS
10.0000 mg | ORAL_TABLET | Freq: Two times a day (BID) | ORAL | 1 refills | Status: AC
  Filled 2024-09-07: qty 180, 90d supply, fill #0

## 2024-09-07 MED ORDER — IBUPROFEN 600 MG PO TABS
600.0000 mg | ORAL_TABLET | Freq: Three times a day (TID) | ORAL | 1 refills | Status: AC | PRN
  Filled 2024-09-07: qty 60, 20d supply, fill #0

## 2024-09-07 MED ORDER — LISINOPRIL 2.5 MG PO TABS
2.5000 mg | ORAL_TABLET | Freq: Every day | ORAL | 1 refills | Status: AC
  Filled 2024-09-07: qty 90, 90d supply, fill #0

## 2024-09-07 MED ORDER — TIZANIDINE HCL 4 MG PO TABS
4.0000 mg | ORAL_TABLET | Freq: Three times a day (TID) | ORAL | 1 refills | Status: AC | PRN
  Filled 2024-09-07: qty 60, 20d supply, fill #0

## 2024-09-07 MED ORDER — ATORVASTATIN CALCIUM 80 MG PO TABS
80.0000 mg | ORAL_TABLET | Freq: Every day | ORAL | 1 refills | Status: AC
  Filled 2024-09-07: qty 90, 90d supply, fill #0

## 2024-09-07 MED ORDER — HYDROCORTISONE 2.5 % EX CREA
TOPICAL_CREAM | Freq: Two times a day (BID) | CUTANEOUS | 0 refills | Status: AC
  Filled 2024-09-07: qty 30, 15d supply, fill #0

## 2024-09-07 MED ORDER — TRULICITY 1.5 MG/0.5ML ~~LOC~~ SOAJ
1.5000 mg | SUBCUTANEOUS | 1 refills | Status: AC
  Filled 2024-09-07: qty 2, 28d supply, fill #0

## 2024-09-07 MED ORDER — METFORMIN HCL 1000 MG PO TABS
1000.0000 mg | ORAL_TABLET | Freq: Two times a day (BID) | ORAL | 1 refills | Status: AC
  Filled 2024-09-07: qty 180, 90d supply, fill #0

## 2024-09-07 MED ORDER — SEMAGLUTIDE (1 MG/DOSE) 4 MG/3ML ~~LOC~~ SOPN
1.0000 mg | PEN_INJECTOR | SUBCUTANEOUS | 1 refills | Status: DC
  Filled 2024-09-07: qty 3, 28d supply, fill #0

## 2024-09-07 NOTE — Patient Instructions (Signed)
Cmo controlar su hipertensin Managing Your Hypertension La hipertensin, tambin conocida como presin arterial alta, se produce cuando la sangre ejerce presin contra las paredes de las arterias con demasiada fuerza. Las arterias son los vasos sanguneos que transportan la sangre desde el corazn hacia todas las partes del cuerpo. La hipertensin hace que el corazn haga ms esfuerzo para bombear la sangre y puede provocar que las arterias se estrechen o endurezcan. Qu significan las lecturas de la presin arterial Una lectura de la presin arterial consta de un nmero ms alto sobre un nmero ms bajo. El primer nmero, o nmero superior, es la presin sistlica. Es la medida de la presin de las arterias cuando el corazn late. El segundo nmero, o nmero inferior, es la presin diastlica. Es la medida de la presin en las arterias cuando el corazn se relaja. Para la mayora de las personas, una presin arterial normal est por debajo de 120/80. La presin arterial deseada puede variar en funcin de las enfermedades, la edad y otros factores personales. La presin arterial se clasifica en cuatro etapas. Sobre la base de la lectura de su presin arterial, el mdico puede usar las siguientes etapas para determinar si necesita tratamiento y de qu tipo. La presin sistlica y la presin diastlica se miden en una unidad llamada milmetros de mercurio (mm Hg). Normal Presin sistlica: por debajo de 120. Presin diastlica: por debajo de 80. Elevada Presin sistlica: 120-129. Presin diastlica: por debajo de 80. Etapa 1 de hipertensin Presin sistlica: 130-139. Presin diastlica: 80-89. Etapa 2 de hipertensin Presin sistlica: 140 o ms. Presin diastlica: 90 o ms. Cmo puede afectarme esta enfermedad? Controlar la hipertensin es muy importante. Con el transcurso del tiempo, la hipertensin puede daar las arterias y disminuir el flujo de sangre hacia partes del cuerpo que  incluyen el cerebro, el corazn y los riones. Tener hipertensin no tratada o no controlada puede causar: Infarto de miocardio. Accidente cerebrovascular. Debilitamiento de los vasos sanguneos (aneurisma). Insuficiencia cardaca. Dao renal. Dao ocular. Problemas de memoria y concentracin. Demencia vascular. Qu medidas puedo tomar para controlar esta afeccin? La hipertensin se puede controlar haciendo cambios en el estilo de vida y, posiblemente, tomando medicamentos. Su mdico le ayudar a crear un plan para bajar la presin arterial al rango normal. Es posible que lo deriven para que reciba asesoramiento sobre una dieta saludable y actividad fsica. Nutricin  Siga una dieta con alto contenido de fibras y potasio, y con bajo contenido de sal (sodio), azcar agregada y grasas. Un ejemplo de plan de alimentacin se denomina dieta DASH. DASH es la sigla en ingls de "Enfoques alimentarios para detener la hipertensin". Para alimentarse de esta manera: Coma mucha fruta y verdura fresca. Trate de que la mitad del plato de cada comida sea de frutas y verduras. Coma cereales integrales, como pasta integral, arroz integral o pan integral. Llene aproximadamente un cuarto del plato con cereales integrales. Consuma productos lcteos descremados. Evite la ingesta de cortes de carne grasa, carne procesada o curada, y carne de ave con piel. Llene aproximadamente un cuarto del plato con protenas magras, como pescado, pollo sin piel, frijoles, huevos o tofu. Evite ingerir alimentos prehechos y procesados. En general, estos tienen mayor cantidad de sodio, azcar agregada y grasa. Reduzca su ingesta diaria de sodio. Muchas personas que tienen hipertensin deben comer menos de 1500 mg de sodio por da. Estilo de vida  Trabaje con su mdico para mantener un peso saludable o perder peso. Pregntele cul es el peso recomendado   para usted. Realice al menos 30 minutos de ejercicio que haga que se acelere  su corazn (ejercicio aerbico) la mayora de los das de la semana. Estas actividades pueden incluir caminar, nadar o andar en bicicleta. Incluya ejercicios para fortalecer sus msculos (ejercicios de resistencia), como levantamiento de pesas, como parte de su rutina semanal de ejercicios. Intente realizar 30 minutos de este tipo de ejercicios al menos tres das a la semana. No consuma ningn producto que contenga nicotina o tabaco. Estos productos incluyen cigarrillos, tabaco para mascar y aparatos de vapeo, como los cigarrillos electrnicos. Si necesita ayuda para dejar de consumir estos productos, consulte al mdico. Controle las enfermedades a largo plazo (crnicas), como el colesterol alto o la diabetes. Identifique sus causas de estrs y encuentre maneras de controlar el estrs. Esto puede incluir meditacin, respiracin profunda o hacerse tiempo para realizar actividades divertidas. Consumo de alcohol No beba alcohol si: Su mdico le indica no hacerlo. Est embarazada, puede estar embarazada o est tratando de quedar embarazada. Si bebe alcohol: Limite la cantidad que bebe a lo siguiente: De 0 a 1 medida por da para las mujeres. De 0 a 2 medidas por da para los hombres. Sepa cunta cantidad de alcohol hay en las bebidas que toma. En los [place], una medida equivale a una botella de cerveza de 12 oz (355 ml), un vaso de vino de 5 oz (148 ml) o un vaso de una bebida alcohlica de alta graduacin de 1 oz (44 ml). Medicamentos El mdico puede recetarle medicamentos si los cambios en el estilo de vida no son suficientes para lograr controlar la presin arterial y si: Su presin arterial sistlica es de 130 o ms. Su presin arterial diastlica es de 80 o ms. Use los medicamentos solamente como se lo haya indicado el mdico. Siga cuidadosamente las indicaciones. Los medicamentos para la presin arterial deben tomarse como se lo haya indicado el mdico. Los medicamentos pierden eficacia  al omitir las dosis. El hecho de omitir las dosis tambin aumenta el riesgo de otros problemas. Monitoreo Antes de controlarse la presin arterial: No fume, no consuma bebidas con cafena ni haga ejercicio dentro de los 30 minutos antes de tomar la medicin. Vaya al bao y vace la vejiga (orine). Permanezca sentado tranquilamente durante al menos 5 minutos antes de tomar las mediciones. Contrlese la presin arterial en su casa segn las indicaciones del mdico. Para hacer esto: Sintese con la espalda recta y con apoyo. Coloque los pies planos en el piso. No se cruce de piernas. Apoye el brazo sobre una superficie plana, como una mesa. Asegrese de que la parte superior del brazo est al nivel del corazn. Cada vez que tome una medicin, tome dos o tres lecturas con un minuto de separacin y anote los resultados. Posiblemente tambin sea necesario que el mdico le controle la presin arterial de manera regular. Informacin general Hable con su mdico acerca de la dieta, hbitos de ejercicio y otros factores del estilo de vida que pueden contribuir a la hipertensin. Revise con su mdico todos los medicamentos que toma ya que puede haber efectos secundarios o interacciones. Concurra a todas las visitas de seguimiento. El mdico puede ayudarle a crear y ajustar su plan para controlar la presin arterial alta. Dnde obtener ms informacin National Heart, Lung, and Blood Institute (Instituto Nacional del Corazn, los Pulmones y la Sangre): www.nhlbi.nih.gov American Heart Association (Asociacin Estadounidense del Corazn): www.heart.org Comunquese con un mdico si: Piensa que tiene una reaccin alrgica a los medicamentos   que ha tomado. Tiene dolores de cabeza frecuentes (recurrentes). Siente mareos. Tiene hinchazn en los tobillos. Tiene problemas de visin. Solicite ayuda de inmediato si: Siente un dolor de cabeza intenso o confusin. Siente debilidad inusual, adormecimiento o que se  desmayar. Siente un dolor intenso en el pecho o el abdomen. Vomita repetidas veces. Tiene dificultad para respirar. Estos sntomas pueden indicar una emergencia. Solicite ayuda de inmediato. Llame al 911. No espere a ver si los sntomas desaparecen. No conduzca por sus propios medios hasta el hospital. Resumen La hipertensin se produce cuando la sangre bombea en las arterias con mucha fuerza. Si esta afeccin no se controla, podra correr riesgo de tener complicaciones graves. La presin arterial deseada puede variar en funcin de las enfermedades, la edad y otros factores personales. Para la mayora de las personas, una presin arterial normal es menor que 120/80. La hipertensin se puede controlar mediante cambios en el estilo de vida, tomando medicamentos, o ambas cosas. Los cambios en el estilo de vida para ayudar a controlar la hipertensin incluyen prdida de peso, seguir una dieta saludable, con bajo contenido de sodio, hacer ms ejercicio, dejar de fumar y limitar el consumo de alcohol. Esta informacin no tiene como fin reemplazar el consejo del mdico. Asegrese de hacerle al mdico cualquier pregunta que tenga. Document Revised: 05/15/2021 Document Reviewed: 05/15/2021 Elsevier Patient Education  2024 Elsevier Inc.  

## 2024-09-07 NOTE — Progress Notes (Signed)
 "  Subjective:  Patient ID: Margaret Austin, female    DOB: [birth year]  Age: 69 y.o. MRN: [id number]  CC: Medical Management of Chronic Issues (Left leg pain X 1 month/Request medication refills )     Discussed the use of AI scribe software for clinical note transcription with the patient, who gave verbal consent to proceed.  History of Present Illness Margaret Austin is a 69 year old female with  a history of Diabetes Mellitus type 2, Hyperlipidemia, panuveitis of both eyes, left eye retinal detachment  who presents for follow-up and management of elevated blood pressure and left leg pain.  Her recent A1c is 6.1, and she endorses adherence with her medications.  She did not take her blood pressure medication this morning but has it available at home.  Blood pressure is elevated today.  She has had sharp left leg pain for about 1 month. It started severely enough to prevent walking but has slightly improved so she can walk with persistent pain. Pain starts at the left hip and radiates down the left lateral thigh. It worsens with pressure and possibly cold weather. She is not taking pain medication now because she ran out of her muscle relaxant and ibuprofen .  She has not yet completed the previously ordered mammogram. She is due for a lipid panel.    Past Medical History:  Diagnosis Date   Diabetes mellitus without complication (HCC)     No past surgical history on file.  Family History  Problem Relation Age of Onset   Autoimmune disease Neg Hx     Social History   Socioeconomic History   Marital status: Single    Spouse name: Not on file   Number of children: Not on file   Years of education: Not on file   Highest education level: Not on file  Occupational History   Not on file  Tobacco Use   Smoking status: Never   Smokeless tobacco: Never  Vaping Use   Vaping status: Never Used  Substance and Sexual Activity   Alcohol use: No   Drug use: No   Sexual  activity: Yes    Birth control/protection: None  Other Topics Concern   Not on file  Social History Narrative   ** Merged History Encounter **       Social Drivers of Health   Tobacco Use: Low Risk (08/18/2024)   Received from [facility]   Patient History    Smoking Tobacco Use: Never    Smokeless Tobacco Use: Never    Passive Exposure: Not on file  Financial Resource Strain: Not on file  Food Insecurity: Not on file  Transportation Needs: Not on file  Physical Activity: Not on file  Stress: Not on file  Social Connections: Not on file  Depression (PHQ2-9): Low Risk (02/03/2024)   Depression (PHQ2-9)    PHQ-2 Score: 0  Alcohol Screen: Not on file  Housing: Not on file  Utilities: Not on file  Health Literacy: Not on file    Allergies[1]  Outpatient Medications Prior to Visit  Medication Sig Dispense Refill   Blood Glucose Monitoring Suppl (TRUE METRIX METER) w/Device KIT Use to check blood sugar 3 times daily. 1 kit 0   glucose blood (TRUE METRIX BLOOD GLUCOSE TEST) test strip Use to check blood sugar 3 times daily. 100 each 6   Insulin  Pen Needle (PEN NEEDLES) 32G X 4 MM MISC Use as instructed to inject insulin  daily. 100 each 2  omeprazole  (PRILOSEC) 20 MG capsule Take 1 capsule (20 mg total) by mouth daily. 30 capsule 3   ondansetron  (ZOFRAN ) 4 MG tablet Take 1 tablet (4 mg total) by mouth every 8 (eight) hours as needed for nausea or vomiting. 30 tablet 1   OVER THE COUNTER MEDICATION Take 1 tablet by mouth daily. Vitamin from [employer]     atorvastatin  (LIPITOR) 80 MG tablet Take 1 tablet (80 mg total) by mouth daily. 90 tablet 1   Insulin  Glargine (BASAGLAR  KWIKPEN) 100 UNIT/ML Inject 30 Units into the skin at bedtime. . 9 mL 2   metFORMIN  (GLUCOPHAGE ) 1000 MG tablet Take 1 tablet (1,000 mg total) by mouth 2 (two) times daily with a meal. 60 tablet 0   Semaglutide , 1 MG/DOSE, 4 MG/3ML SOPN Inject 1 mg as directed once a week. 3 mL 1   fluticasone  (FLONASE ) 50  MCG/ACT nasal spray Place 2 sprays into both nostrils daily. (Patient not taking: Reported on 09/07/2024) 16 g 5   Iron , Ferrous Sulfate , 325 (65 Fe) MG TABS Take 325 mg by mouth 2 (two) times daily. (Patient not taking: Reported on 05/25/2024) 60 tablet 1   TRUEplus Lancets 28G MISC Use to check blood sugar 3 times daily. (Patient not taking: Reported on 09/07/2024) 100 each 6   glipiZIDE  (GLUCOTROL ) 10 MG tablet Take 1 tablet (10 mg total) by mouth 2 (two) times daily. (Patient not taking: Reported on 09/07/2024) 180 tablet 1   hydrocortisone  2.5 % cream Apply topically 2 (two) times daily. (Patient not taking: Reported on 05/25/2024) 30 g 0   ibuprofen  (ADVIL ) 600 MG tablet Take 1 tablet (600 mg total) by mouth every 6 (six) hours as needed. (Patient not taking: Reported on 09/07/2024) 30 tablet 0   lisinopril  (ZESTRIL ) 2.5 MG tablet Take 1 tablet (2.5 mg total) by mouth daily. (Patient not taking: Reported on 09/07/2024) 90 tablet 1   tiZANidine  (ZANAFLEX ) 4 MG tablet Take 1 tablet (4 mg total) by mouth every 8 (eight) hours as needed. (Patient not taking: Reported on 09/07/2024) 60 tablet 1   No facility-administered medications prior to visit.     ROS Review of Systems  Constitutional:  Negative for activity change and appetite change.  HENT:  Negative for sinus pressure and sore throat.   Respiratory:  Negative for chest tightness, shortness of breath and wheezing.   Cardiovascular:  Negative for chest pain and palpitations.  Gastrointestinal:  Negative for abdominal distention, abdominal pain and constipation.  Genitourinary: Negative.   Musculoskeletal:        See HPI  Psychiatric/Behavioral:  Negative for behavioral problems and dysphoric mood.     Objective:  BP (!) 153/82   Pulse 72   Temp 98.1 F (36.7 C) (Oral)   Ht 5' 2 (1.575 m)   Wt 151 lb 6.4 oz (68.7 kg)   SpO2 100%   BMI 27.69 kg/m      09/07/2024    3:34 PM 09/07/2024    3:30 PM 05/25/2024    1:16 PM  BP/Weight   Systolic BP 153 164 128  Diastolic BP 82 72 74  Wt. (Lbs)  151.4 146.4  BMI  27.69 kg/m2 26.78 kg/m2      Physical Exam Constitutional:      Appearance: She is well-developed.  Cardiovascular:     Rate and Rhythm: Normal rate.     Heart sounds: Normal heart sounds. No murmur heard. Pulmonary:     Effort: Pulmonary effort is normal.  Breath sounds: Normal breath sounds. No wheezing or rales.  Chest:     Chest wall: No tenderness.  Abdominal:     General: Bowel sounds are normal. There is no distension.     Palpations: Abdomen is soft. There is no mass.     Tenderness: There is no abdominal tenderness.  Musculoskeletal:        General: Normal range of motion.     Right lower leg: No edema.     Left lower leg: No edema.     Comments: Slight tenderness on palpation of lateral aspect of left femur and on range of motion of left hip Right hip is normal  Neurological:     Mental Status: She is alert and oriented to person, place, and time.  Psychiatric:        Mood and Affect: Mood normal.        Latest Ref Rng & Units 05/05/2024    4:07 PM 05/28/2023    2:12 PM 11/13/2022    2:59 PM  CMP  Glucose 70 - 99 mg/dL 893  83  824   BUN 8 - 27 mg/dL 16  9  10    Creatinine 0.57 - 1.00 mg/dL 9.19  9.37  9.42   Sodium 134 - 144 mmol/L 138  142  139   Potassium 3.5 - 5.2 mmol/L 4.3  4.1  4.1   Chloride 96 - 106 mmol/L 103  102  103   CO2 20 - 29 mmol/L 22  22  22    Calcium  8.7 - 10.3 mg/dL 9.3  9.0  9.0   Total Protein 6.0 - 8.5 g/dL 6.7   6.4   Total Bilirubin 0.0 - 1.2 mg/dL 2.0   0.9   Alkaline Phos 49 - 135 IU/L 138   140   AST 0 - 40 IU/L 36   32   ALT 0 - 32 IU/L 23   22     Lipid Panel     Component Value Date/Time   CHOL 138 10/09/2022 1518   TRIG 60 10/09/2022 1518   HDL 51 10/09/2022 1518   CHOLHDL 3.2 10/03/2020 1639   CHOLHDL 5.2 (H) 02/06/2016 1557   VLDL 42 (H) 02/06/2016 1557   LDLCALC 74 10/09/2022 1518    CBC    Component Value Date/Time   WBC  3.8 05/05/2024 1607   WBC 10.3 06/01/2021 0213   RBC 2.22 (LL) 05/05/2024 1607   RBC 4.33 06/01/2021 0213   HGB 8.8 (L) 05/05/2024 1607   HCT 25.0 (L) 05/05/2024 1607   PLT 272 05/05/2024 1607   MCV 113 (H) 05/05/2024 1607   MCH 39.6 (H) 05/05/2024 1607   MCH 30.5 06/01/2021 0213   MCHC 35.2 05/05/2024 1607   MCHC 33.1 06/01/2021 0213   RDW 13.1 06/01/2021 0213   LYMPHSABS 1.6 05/05/2024 1607   MONOABS 0.5 05/31/2021 1735   EOSABS 0.0 05/05/2024 1607   BASOSABS 0.0 05/05/2024 1607    Lab Results  Component Value Date   HGBA1C 6.1 09/07/2024   Lab Results  Component Value Date   HGBA1C 6.1 09/07/2024   HGBA1C 6.0 05/05/2024   HGBA1C 13.0 (A) 02/03/2024        Assessment & Plan Pain of left hip Left hip pain persistent, improved but exacerbated by pressure and possibly cold weather. - Ordered x-ray of left hip. - Refilled tizanidine  and ibuprofen .  Type 2 diabetes mellitus Diabetes well-controlled with A1c of 6.1%. - Continue current diabetes management regimen. -  Counseled on Diabetic diet, the healthy plate, 849 minutes of moderate intensity exercise/week Blood sugar logs with fasting goals of 80-120 mg/dl, random of less than 819 and in the event of sugars less than 60 mg/dl or greater than 599 mg/dl encouraged to notify the clinic. Advised on the need for annual eye exams, annual foot exams, Pneumonia vaccine.   Primary hypertension Blood pressure elevated, possibly due to missed medication dose. - Adherence with antihypertensive has been emphasized -Counseled on blood pressure goal of less than 130/80, low-sodium, DASH diet, medication compliance, 150 minutes of moderate intensity exercise per week. Discussed medication compliance, adverse effects.   Hyperlipidemia associated with type 2 diabetes mellitus Controlled Cholesterol levels need assessment. - Ordered fasting cholesterol test. - Continue statin - Low-cholesterol diet  General Health  Maintenance Mammogram and bone density screening overdue. Encounter for screening for breast cancer/estrogen deficiency- Provided form for mammogram and bone density screening.       Meds ordered this encounter  Medications   hydrocortisone  2.5 % cream    Sig: Apply topically 2 (two) times daily.    Dispense:  30 g    Refill:  0   ibuprofen  (ADVIL ) 600 MG tablet    Sig: Take 1 tablet (600 mg total) by mouth every 8 (eight) hours as needed.    Dispense:  60 tablet    Refill:  1   tiZANidine  (ZANAFLEX ) 4 MG tablet    Sig: Take 1 tablet (4 mg total) by mouth every 8 (eight) hours as needed.    Dispense:  60 tablet    Refill:  1   atorvastatin  (LIPITOR) 80 MG tablet    Sig: Take 1 tablet (80 mg total) by mouth daily.    Dispense:  90 tablet    Refill:  1   glipiZIDE  (GLUCOTROL ) 10 MG tablet    Sig: Take 1 tablet (10 mg total) by mouth 2 (two) times daily.    Dispense:  180 tablet    Refill:  1   Insulin  Glargine (BASAGLAR  KWIKPEN) 100 UNIT/ML    Sig: Inject 30 Units into the skin at bedtime. .    Dispense:  9 mL    Refill:  2   lisinopril  (ZESTRIL ) 2.5 MG tablet    Sig: Take 1 tablet (2.5 mg total) by mouth daily.    Dispense:  90 tablet    Refill:  1   metFORMIN  (GLUCOPHAGE ) 1000 MG tablet    Sig: Take 1 tablet (1,000 mg total) by mouth 2 (two) times daily with a meal.    Dispense:  180 tablet    Refill:  1   DISCONTD: Semaglutide , 1 MG/DOSE, 4 MG/3ML SOPN    Sig: Inject 1 mg as directed once a week.    Dispense:  3 mL    Refill:  1    Follow-up: Return in about 6 months (around 03/07/2025) for Chronic medical conditions.       Corrina Sabin, MD, FAAFP. [facility] [place], [place] [phone]   09/07/2024, 6:24 PM    [1] No Known Allergies  "

## 2024-09-08 ENCOUNTER — Ambulatory Visit: Payer: Self-pay | Admitting: Family Medicine

## 2024-09-08 ENCOUNTER — Other Ambulatory Visit: Payer: Self-pay

## 2024-09-08 LAB — LP+NON-HDL CHOLESTEROL
Cholesterol, Total: 175 mg/dL (ref 100–199)
HDL: 49 mg/dL
LDL Chol Calc (NIH): 102 mg/dL — ABNORMAL HIGH (ref 0–99)
Total Non-HDL-Chol (LDL+VLDL): 126 mg/dL (ref 0–129)
Triglycerides: 134 mg/dL (ref 0–149)
VLDL Cholesterol Cal: 24 mg/dL (ref 5–40)

## 2024-09-17 ENCOUNTER — Other Ambulatory Visit: Payer: Self-pay

## 2024-09-28 ENCOUNTER — Ambulatory Visit: Payer: Self-pay | Admitting: Family Medicine

## 2025-03-08 ENCOUNTER — Ambulatory Visit: Payer: Self-pay | Admitting: Family Medicine
